# Patient Record
Sex: Male | Born: 1960 | Race: White | Hispanic: No | Marital: Single | Smoking: Current every day smoker
Health system: Southern US, Community
[De-identification: ages and names within clinical notes are randomized; demographics above are authoritative.]

## PROBLEM LIST (undated history)

## (undated) DIAGNOSIS — F17210 Nicotine dependence, cigarettes, uncomplicated: Secondary | ICD-10-CM

## (undated) DIAGNOSIS — I10 Essential (primary) hypertension: Secondary | ICD-10-CM

## (undated) DIAGNOSIS — N186 End stage renal disease: Secondary | ICD-10-CM

## (undated) DIAGNOSIS — B182 Chronic viral hepatitis C: Secondary | ICD-10-CM

## (undated) DIAGNOSIS — E782 Mixed hyperlipidemia: Secondary | ICD-10-CM

---

## 2019-06-02 DIAGNOSIS — F17200 Nicotine dependence, unspecified, uncomplicated: Secondary | ICD-10-CM | POA: Insufficient documentation

## 2019-06-02 DIAGNOSIS — N179 Acute kidney failure, unspecified: Secondary | ICD-10-CM | POA: Insufficient documentation

## 2019-08-10 DIAGNOSIS — N179 Acute kidney failure, unspecified: Secondary | ICD-10-CM | POA: Insufficient documentation

## 2019-08-21 DIAGNOSIS — J156 Pneumonia due to other aerobic Gram-negative bacteria: Secondary | ICD-10-CM | POA: Insufficient documentation

## 2019-08-21 DIAGNOSIS — K8020 Calculus of gallbladder without cholecystitis without obstruction: Secondary | ICD-10-CM | POA: Insufficient documentation

## 2019-08-21 DIAGNOSIS — K760 Fatty (change of) liver, not elsewhere classified: Secondary | ICD-10-CM | POA: Insufficient documentation

## 2019-08-21 DIAGNOSIS — Z992 Dependence on renal dialysis: Secondary | ICD-10-CM | POA: Insufficient documentation

## 2019-08-21 DIAGNOSIS — E43 Unspecified severe protein-calorie malnutrition: Secondary | ICD-10-CM | POA: Insufficient documentation

## 2019-08-21 DIAGNOSIS — M51369 Other intervertebral disc degeneration, lumbar region without mention of lumbar back pain or lower extremity pain: Secondary | ICD-10-CM | POA: Insufficient documentation

## 2019-08-21 DIAGNOSIS — Z967 Presence of other bone and tendon implants: Secondary | ICD-10-CM | POA: Insufficient documentation

## 2019-08-21 DIAGNOSIS — B182 Chronic viral hepatitis C: Secondary | ICD-10-CM | POA: Insufficient documentation

## 2019-08-21 DIAGNOSIS — M5136 Other intervertebral disc degeneration, lumbar region: Secondary | ICD-10-CM | POA: Insufficient documentation

## 2019-09-08 ENCOUNTER — Emergency Department (HOSPITAL_COMMUNITY): Payer: Medicaid Other

## 2019-09-08 ENCOUNTER — Encounter (HOSPITAL_COMMUNITY): Payer: Self-pay | Admitting: Internal Medicine

## 2019-09-08 ENCOUNTER — Inpatient Hospital Stay (HOSPITAL_COMMUNITY)
Admission: EM | Admit: 2019-09-08 | Discharge: 2019-09-17 | DRG: 981 | Disposition: A | Discharge status: Home / Self Care | Payer: Medicaid Other | Attending: Family Medicine | Admitting: Family Medicine

## 2019-09-08 DIAGNOSIS — I160 Hypertensive urgency: Secondary | ICD-10-CM | POA: Diagnosis present

## 2019-09-08 DIAGNOSIS — Z79899 Other long term (current) drug therapy: Secondary | ICD-10-CM

## 2019-09-08 DIAGNOSIS — Z7952 Long term (current) use of systemic steroids: Secondary | ICD-10-CM

## 2019-09-08 DIAGNOSIS — G9341 Metabolic encephalopathy: Secondary | ICD-10-CM | POA: Diagnosis not present

## 2019-09-08 DIAGNOSIS — J449 Chronic obstructive pulmonary disease, unspecified: Secondary | ICD-10-CM | POA: Diagnosis not present

## 2019-09-08 DIAGNOSIS — E782 Mixed hyperlipidemia: Secondary | ICD-10-CM | POA: Diagnosis present

## 2019-09-08 DIAGNOSIS — R0602 Shortness of breath: Secondary | ICD-10-CM

## 2019-09-08 DIAGNOSIS — E8770 Fluid overload, unspecified: Secondary | ICD-10-CM | POA: Diagnosis present

## 2019-09-08 DIAGNOSIS — I10 Essential (primary) hypertension: Secondary | ICD-10-CM | POA: Insufficient documentation

## 2019-09-08 DIAGNOSIS — E875 Hyperkalemia: Secondary | ICD-10-CM | POA: Diagnosis present

## 2019-09-08 DIAGNOSIS — J9601 Acute respiratory failure with hypoxia: Secondary | ICD-10-CM | POA: Diagnosis not present

## 2019-09-08 DIAGNOSIS — F419 Anxiety disorder, unspecified: Secondary | ICD-10-CM | POA: Diagnosis present

## 2019-09-08 DIAGNOSIS — G40909 Epilepsy, unspecified, not intractable, without status epilepticus: Secondary | ICD-10-CM | POA: Diagnosis present

## 2019-09-08 DIAGNOSIS — R569 Unspecified convulsions: Secondary | ICD-10-CM

## 2019-09-08 DIAGNOSIS — Z880 Allergy status to penicillin: Secondary | ICD-10-CM

## 2019-09-08 DIAGNOSIS — Z20822 Contact with and (suspected) exposure to covid-19: Secondary | ICD-10-CM | POA: Diagnosis present

## 2019-09-08 DIAGNOSIS — D631 Anemia in chronic kidney disease: Secondary | ICD-10-CM | POA: Diagnosis present

## 2019-09-08 DIAGNOSIS — N186 End stage renal disease: Secondary | ICD-10-CM | POA: Diagnosis present

## 2019-09-08 DIAGNOSIS — Z9119 Patient's noncompliance with other medical treatment and regimen: Secondary | ICD-10-CM

## 2019-09-08 DIAGNOSIS — E038 Other specified hypothyroidism: Secondary | ICD-10-CM | POA: Diagnosis present

## 2019-09-08 DIAGNOSIS — B182 Chronic viral hepatitis C: Secondary | ICD-10-CM | POA: Diagnosis present

## 2019-09-08 DIAGNOSIS — Z992 Dependence on renal dialysis: Secondary | ICD-10-CM

## 2019-09-08 DIAGNOSIS — N2581 Secondary hyperparathyroidism of renal origin: Secondary | ICD-10-CM | POA: Diagnosis present

## 2019-09-08 DIAGNOSIS — I959 Hypotension, unspecified: Secondary | ICD-10-CM | POA: Diagnosis present

## 2019-09-08 DIAGNOSIS — F17210 Nicotine dependence, cigarettes, uncomplicated: Secondary | ICD-10-CM | POA: Diagnosis present

## 2019-09-08 DIAGNOSIS — R278 Other lack of coordination: Secondary | ICD-10-CM | POA: Diagnosis present

## 2019-09-08 DIAGNOSIS — G92 Toxic encephalopathy: Principal | ICD-10-CM | POA: Diagnosis present

## 2019-09-08 DIAGNOSIS — Z7951 Long term (current) use of inhaled steroids: Secondary | ICD-10-CM

## 2019-09-08 DIAGNOSIS — N250 Renal osteodystrophy: Secondary | ICD-10-CM | POA: Diagnosis present

## 2019-09-08 DIAGNOSIS — R946 Abnormal results of thyroid function studies: Secondary | ICD-10-CM | POA: Diagnosis present

## 2019-09-08 DIAGNOSIS — Z79891 Long term (current) use of opiate analgesic: Secondary | ICD-10-CM

## 2019-09-08 DIAGNOSIS — I120 Hypertensive chronic kidney disease with stage 5 chronic kidney disease or end stage renal disease: Secondary | ICD-10-CM | POA: Diagnosis present

## 2019-09-08 HISTORY — DX: Mixed hyperlipidemia: E78.2

## 2019-09-08 HISTORY — DX: Epilepsy, unspecified, not intractable, without status epilepticus: G40.909

## 2019-09-08 HISTORY — DX: Chronic obstructive pulmonary disease, unspecified: J44.9

## 2019-09-08 HISTORY — DX: End stage renal disease: N18.6

## 2019-09-08 HISTORY — DX: Essential (primary) hypertension: I10

## 2019-09-08 HISTORY — DX: Nicotine dependence, cigarettes, uncomplicated: F17.210

## 2019-09-08 HISTORY — DX: Chronic viral hepatitis C: B18.2

## 2019-09-08 LAB — I-STAT CHEM 8, ED
BUN: 77 mg/dL — ABNORMAL HIGH (ref 8–23)
Calcium, Ion: 1.04 mmol/L — ABNORMAL LOW (ref 1.15–1.40)
Chloride: 104 mmol/L (ref 98–111)
Creatinine, Ser: 13.5 mg/dL — ABNORMAL HIGH (ref 0.61–1.24)
Glucose, Bld: 89 mg/dL (ref 70–99)
HCT: 33 % — ABNORMAL LOW (ref 39.0–52.0)
Hemoglobin: 11.2 g/dL — ABNORMAL LOW (ref 13.0–17.0)
Potassium: 7.7 mmol/L (ref 3.5–5.1)
Sodium: 135 mmol/L (ref 135–145)
TCO2: 23 mmol/L (ref 22–32)

## 2019-09-08 LAB — AMMONIA: Ammonia: 36 umol/L — ABNORMAL HIGH (ref 9–35)

## 2019-09-08 LAB — COMPREHENSIVE METABOLIC PANEL
ALT: 12 U/L (ref 0–44)
AST: 15 U/L (ref 15–41)
Albumin: 2.6 g/dL — ABNORMAL LOW (ref 3.5–5.0)
Alkaline Phosphatase: 100 U/L (ref 38–126)
Anion gap: 20 — ABNORMAL HIGH (ref 5–15)
BUN: 84 mg/dL — ABNORMAL HIGH (ref 8–23)
CO2: 18 mmol/L — ABNORMAL LOW (ref 22–32)
Calcium: 9.1 mg/dL (ref 8.9–10.3)
Chloride: 99 mmol/L (ref 98–111)
Creatinine, Ser: 12.25 mg/dL — ABNORMAL HIGH (ref 0.61–1.24)
GFR calc Af Amer: 5 mL/min — ABNORMAL LOW (ref >60)
GFR calc non Af Amer: 4 mL/min — ABNORMAL LOW (ref >60)
Glucose, Bld: 81 mg/dL (ref 70–99)
Potassium: >7.5 mmol/L (ref 3.5–5.1)
Sodium: 137 mmol/L (ref 135–145)
Total Bilirubin: 0.7 mg/dL (ref 0.3–1.2)
Total Protein: 7.3 g/dL (ref 6.5–8.1)

## 2019-09-08 LAB — CBC WITH DIFFERENTIAL/PLATELET
Abs Immature Granulocytes: 0.04 10*3/uL (ref 0.00–0.07)
Basophils Absolute: 0.1 10*3/uL (ref 0.0–0.1)
Basophils Relative: 1 %
Eosinophils Absolute: 0.3 10*3/uL (ref 0.0–0.5)
Eosinophils Relative: 3 %
HCT: 35.3 % — ABNORMAL LOW (ref 39.0–52.0)
Hemoglobin: 10.7 g/dL — ABNORMAL LOW (ref 13.0–17.0)
Immature Granulocytes: 0 %
Lymphocytes Relative: 13 %
Lymphs Abs: 1.3 10*3/uL (ref 0.7–4.0)
MCH: 28.2 pg (ref 26.0–34.0)
MCHC: 30.3 g/dL (ref 30.0–36.0)
MCV: 92.9 fL (ref 80.0–100.0)
Monocytes Absolute: 0.6 10*3/uL (ref 0.1–1.0)
Monocytes Relative: 6 %
Neutro Abs: 7.5 10*3/uL (ref 1.7–7.7)
Neutrophils Relative %: 77 %
Platelets: 277 10*3/uL (ref 150–400)
RBC: 3.8 MIL/uL — ABNORMAL LOW (ref 4.22–5.81)
RDW: 18.2 % — ABNORMAL HIGH (ref 11.5–15.5)
WBC: 9.8 10*3/uL (ref 4.0–10.5)
nRBC: 0 % (ref 0.0–0.2)

## 2019-09-08 LAB — CBG MONITORING, ED: Glucose-Capillary: 86 mg/dL (ref 70–99)

## 2019-09-08 LAB — RAPID URINE DRUG SCREEN, HOSP PERFORMED
Amphetamines: NOT DETECTED
Barbiturates: NOT DETECTED
Benzodiazepines: POSITIVE — AB
Cocaine: NOT DETECTED
Opiates: NOT DETECTED
Tetrahydrocannabinol: POSITIVE — AB

## 2019-09-08 LAB — URINALYSIS, ROUTINE W REFLEX MICROSCOPIC
Bilirubin Urine: NEGATIVE
Glucose, UA: >=500 mg/dL — AB
Hgb urine dipstick: NEGATIVE
Ketones, ur: NEGATIVE mg/dL
Leukocytes,Ua: NEGATIVE
Nitrite: NEGATIVE
Protein, ur: >=300 mg/dL — AB
Specific Gravity, Urine: 1.012 (ref 1.005–1.030)
pH: 8 (ref 5.0–8.0)

## 2019-09-08 LAB — LACTIC ACID, PLASMA: Lactic Acid, Venous: 1.1 mmol/L (ref 0.5–1.9)

## 2019-09-08 LAB — ETHANOL: Alcohol, Ethyl (B): <10 mg/dL (ref <10)

## 2019-09-08 LAB — SARS CORONAVIRUS 2 BY RT PCR (HOSPITAL ORDER, PERFORMED IN ~~LOC~~ HOSPITAL LAB): SARS Coronavirus 2: NEGATIVE

## 2019-09-08 MED ORDER — INSULIN ASPART 100 UNIT/ML IV SOLN
5.0000 [IU] | Freq: Once | INTRAVENOUS | Status: AC
  Administered 2019-09-08: 5 [IU] via INTRAVENOUS

## 2019-09-08 MED ORDER — METOPROLOL TARTRATE 25 MG PO TABS
25.0000 mg | ORAL_TABLET | Freq: Two times a day (BID) | ORAL | Status: DC
  Administered 2019-09-08 – 2019-09-09 (×2): 25 mg via ORAL
  Filled 2019-09-08 (×2): qty 1

## 2019-09-08 MED ORDER — ONDANSETRON HCL 4 MG PO TABS: 4.0000 mg | ORAL_TABLET | Freq: Four times a day (QID) | ORAL | Status: DC | PRN

## 2019-09-08 MED ORDER — ONDANSETRON HCL 4 MG/2ML IJ SOLN: 4.0000 mg | Freq: Four times a day (QID) | INTRAMUSCULAR | Status: DC | PRN

## 2019-09-08 MED ORDER — HEPARIN SODIUM (PORCINE) 5000 UNIT/ML IJ SOLN
5000.0000 [IU] | Freq: Three times a day (TID) | INTRAMUSCULAR | Status: DC
  Administered 2019-09-08 – 2019-09-16 (×24): 5000 [IU] via SUBCUTANEOUS
  Filled 2019-09-08 (×27): qty 1

## 2019-09-08 MED ORDER — LOSARTAN POTASSIUM 50 MG PO TABS
50.0000 mg | ORAL_TABLET | Freq: Every day | ORAL | Status: DC
  Administered 2019-09-09: 50 mg via ORAL
  Filled 2019-09-08: qty 1

## 2019-09-08 MED ORDER — IPRATROPIUM-ALBUTEROL 0.5-2.5 (3) MG/3ML IN SOLN: 3.0000 mL | RESPIRATORY_TRACT | Status: DC | PRN

## 2019-09-08 MED ORDER — ACETAMINOPHEN 650 MG RE SUPP: 650.0000 mg | Freq: Four times a day (QID) | RECTAL | Status: DC | PRN

## 2019-09-08 MED ORDER — CALCIUM GLUCONATE-NACL 1-0.675 GM/50ML-% IV SOLN
1.0000 g | Freq: Once | INTRAVENOUS | Status: AC
  Administered 2019-09-08: 1000 mg via INTRAVENOUS
  Filled 2019-09-08: qty 50

## 2019-09-08 MED ORDER — TAMSULOSIN HCL 0.4 MG PO CAPS
0.4000 mg | ORAL_CAPSULE | Freq: Every day | ORAL | Status: DC
  Administered 2019-09-08: 0.4 mg via ORAL
  Filled 2019-09-08: qty 1

## 2019-09-08 MED ORDER — CHLORHEXIDINE GLUCONATE CLOTH 2 % EX PADS
6.0000 | MEDICATED_PAD | Freq: Every day | CUTANEOUS | Status: DC
  Administered 2019-09-09 – 2019-09-16 (×6): 6 via TOPICAL

## 2019-09-08 MED ORDER — LORAZEPAM 2 MG/ML IJ SOLN
1.0000 mg | Freq: Once | INTRAMUSCULAR | Status: AC
  Administered 2019-09-08: 1 mg via INTRAVENOUS
  Filled 2019-09-08: qty 1

## 2019-09-08 MED ORDER — GABAPENTIN 300 MG PO CAPS
300.0000 mg | ORAL_CAPSULE | Freq: Every day | ORAL | Status: DC
  Administered 2019-09-08 – 2019-09-17 (×10): 300 mg via ORAL
  Filled 2019-09-08 (×10): qty 1

## 2019-09-08 MED ORDER — ACETAMINOPHEN 325 MG PO TABS
650.0000 mg | ORAL_TABLET | Freq: Four times a day (QID) | ORAL | Status: DC | PRN
  Administered 2019-09-10 – 2019-09-16 (×5): 650 mg via ORAL
  Filled 2019-09-08 (×5): qty 2

## 2019-09-08 MED ORDER — SODIUM ZIRCONIUM CYCLOSILICATE 10 G PO PACK
10.0000 g | PACK | Freq: Once | ORAL | Status: AC
  Administered 2019-09-08: 10 g via ORAL
  Filled 2019-09-08: qty 1

## 2019-09-08 MED ORDER — HYDRALAZINE HCL 50 MG PO TABS
50.0000 mg | ORAL_TABLET | Freq: Two times a day (BID) | ORAL | Status: DC
  Administered 2019-09-08 – 2019-09-09 (×2): 50 mg via ORAL
  Filled 2019-09-08: qty 1
  Filled 2019-09-08: qty 2

## 2019-09-08 MED ORDER — DEXTROSE 50 % IV SOLN
1.0000 | Freq: Once | INTRAVENOUS | Status: AC
  Administered 2019-09-08: 50 mL via INTRAVENOUS
  Filled 2019-09-08: qty 50

## 2019-09-08 MED ORDER — POLYETHYLENE GLYCOL 3350 17 G PO PACK
17.0000 g | PACK | Freq: Every day | ORAL | Status: DC | PRN
  Filled 2019-09-08: qty 1

## 2019-09-08 MED ORDER — HYDRALAZINE HCL 20 MG/ML IJ SOLN: 10.0000 mg | Freq: Four times a day (QID) | INTRAMUSCULAR | Status: DC | PRN

## 2019-09-08 MED ORDER — BUDESONIDE 0.5 MG/2ML IN SUSP
0.5000 mg | Freq: Two times a day (BID) | RESPIRATORY_TRACT | Status: DC
  Administered 2019-09-09 – 2019-09-17 (×16): 0.5 mg via RESPIRATORY_TRACT
  Filled 2019-09-08 (×18): qty 2

## 2019-09-08 MED ORDER — SODIUM BICARBONATE 650 MG PO TABS
650.0000 mg | ORAL_TABLET | Freq: Two times a day (BID) | ORAL | Status: DC
  Administered 2019-09-08 – 2019-09-09 (×2): 650 mg via ORAL
  Filled 2019-09-08 (×3): qty 1

## 2019-09-08 MED ORDER — AMLODIPINE BESYLATE 10 MG PO TABS
10.0000 mg | ORAL_TABLET | Freq: Every day | ORAL | Status: DC
  Administered 2019-09-09: 10 mg via ORAL
  Filled 2019-09-08: qty 1

## 2019-09-08 MED ORDER — ATORVASTATIN CALCIUM 40 MG PO TABS
40.0000 mg | ORAL_TABLET | Freq: Every day | ORAL | Status: DC
  Administered 2019-09-09 – 2019-09-17 (×9): 40 mg via ORAL
  Filled 2019-09-08 (×9): qty 1

## 2019-09-08 NOTE — ED Notes (Signed)
Redrawn I-Stat and notified MD Tomi Bamberger and RN Anderson Malta of K 7.7.

## 2019-09-08 NOTE — H&P (Addendum)
History and Physical    Ralph Becker KGU:[id number] DOB: 1960-08-14 DOA: 09/08/2019  PCP: Patient, No Pcp Per  Patient coming from: Home   Chief Complaint:  Chief Complaint  Patient presents with  . Seizures  . Altered Mental Status     HPI:    59 year old male with past medical history of COPD, end-stage renal disease (Right IJ cath, getting HD tues, thurs, sat), hypertension, hyperlipidemia, nicotine dependence, hepatitis C and seizure disorder (not on antiepileptics ), chronic low back pain, who presents to [facility] emergency department via EMS status post episode of seizure.  Patient is an extremely poor historian due to confusion.  Patient explains that earlier today he was sitting in his wheelchair when experienced a seizure and states that he "was feeling shaky" prior to the onset of the seizure.  Patient is unable to attest to any tongue biting, self urination or self defecation.  According to emergency department notes seizure lasted approximately 1 minute.  Upon further questioning, patient does admit to missing several sessions of dialysis however exactly how many is unclear.  At 1 point he said he missed a single session, then he denied missing any sessions at all, then he admitted to missing 2 sessions in the past week.  Patient denies any recent history of shortness of breath, cough, diarrhea, abdominal pain, nausea, vomiting, sick contacts.  Patient is unable to provide further detail due to being a poor historian and confusion.  Upon evaluation in the emergency department, patient was found to have a potassium of 7.7.  Patient was administered calcium gluconate, insulin, dextrose and Lokelma.  Dr. Augustin Coupe with nephrology was contacted who stated that they will proceed with emergent dialysis this evening.  Considering the patient's seizure activity, case was discussed with Dr. Lorraine Lax who stated that considering patient's presentation with uremia not to load patient with  antiepileptics at this time.  The hospitalist group was then called to assess the patient for admission the hospital.   Review of Systems: Unable to obtain due to patient being a poor historian.   Past Medical History:  Diagnosis Date  . Chronic hepatitis C ([place])   . COPD (chronic obstructive pulmonary disease) ([place]) 09/08/2019  . ESRD (end stage renal disease) ([place])    HD Tues, Thurs, Sat  . Essential hypertension   . Mixed hyperlipidemia   . Nicotine dependence, cigarettes, uncomplicated   . Seizure disorder ([place]) 09/08/2019    History reviewed. No pertinent surgical history.   reports that he has been smoking. He has never used smokeless tobacco. He reports that he does not drink alcohol and does not use drugs.  Allergies  Allergen Reactions  . Penicillins     Unknown. Can't remember.     Family History  Family history unknown: Yes     Prior to Admission medications   Medication Sig Start Date End Date Taking? Authorizing Provider  clonazePAM (KLONOPIN) 1 MG tablet Take 1 mg by mouth at bedtime. 07/11/19  Yes [provider]  FLOVENT HFA 220 MCG/ACT inhaler Inhale 2 puffs into the lungs every 12 (twelve) hours. 07/11/19  Yes [provider]  gabapentin (NEURONTIN) 300 MG capsule Take 300 mg by mouth daily. 06/06/19  Yes [provider]  hydrALAZINE (APRESOLINE) 50 MG tablet Take 50 mg by mouth in the morning and at bedtime.  08/23/19  Yes [provider]  labetalol (NORMODYNE) 200 MG tablet Take 200 mg by mouth every 12 (twelve) hours. 08/23/19  Yes [provider]  losartan (COZAAR) 50 MG tablet Take 50 mg by mouth daily. 06/06/19  Yes [provider]  metoprolol tartrate (LOPRESSOR) 25 MG tablet Take 25 mg by mouth 2 (two) times daily. 08/08/19  Yes [provider]  sodium bicarbonate 650 MG tablet Take 650 mg by mouth 2 (two) times daily.  06/06/19  Yes [provider]  SUBOXONE 8-2 MG FILM Place 1 Film under the  tongue daily. 08/23/19  Yes [provider]  tamsulosin (FLOMAX) 0.4 MG CAPS capsule Take 0.4 mg by mouth at bedtime. 06/26/19  Yes [provider]  amLODipine (NORVASC) 10 MG tablet Take 10 mg by mouth daily. 08/23/19   [provider]  atorvastatin (LIPITOR) 40 MG tablet Take 40 mg by mouth daily. 08/08/19   [provider]  clotrimazole (LOTRIMIN) 1 % cream Apply 1 application topically 3 (three) times daily. 08/22/19   [provider]    Physical Exam: Vitals:   09/08/19 2151 09/08/19 2200 09/08/19 2208 09/08/19 2215  BP:  (!) 169/100  (!) 188/124  Pulse: 83 84 83 89  Resp: (!) 21  (!) 23 (!) 26  SpO2: 96% 92% 97% 98%  Weight:      Height:        Constitutional: Lethargic but arousable and oriented x 1, patient is not in any acute distress. Skin: no rashes, no lesions, good skin turgor noted. Eyes: Pupils are equally reactive to light.  No evidence of scleral icterus or conjunctival pallor.  ENMT: Moist mucous membranes noted.  Posterior pharynx clear of any exudate or lesions.   Neck: normal, supple, no masses, no thyromegaly.  No evidence of jugular venous distension.   Respiratory: Expiratory wheezing noted, mostly in the left lung fields.  Faint bibasilar rales, Normal respiratory effort. No accessory muscle use.  Cardiovascular: Regular rate and rhythm, no murmurs / rubs / gallops. No extremity edema. 2+ pedal pulses. No carotid bruits.  Chest:   Right IJ catheter noted to be in place.  Nontender without crepitus or deformity.   Back:   Nontender without crepitus or deformity. Abdomen: Abdomen is soft and nontender.  No evidence of intra-abdominal masses.  Positive bowel sounds noted in all quadrants.   Musculoskeletal: No joint deformity upper and lower extremities. Good ROM, no contractures. Normal muscle tone.  Neurologic: Patient is lethargic but arousable and only oriented x1.  Patient is somewhat tremulous at rest.  CN 2-12 grossly  intact. Sensation intact, strength noted to be 5 out of 5 in all 4 extremities.  Patient is following all commands.  Patient is responsive to verbal stimuli.   Psychiatric: Patient presents as a depressed mood with flat affect.  Assessment obscured by confusion.  Patient does not seem to possess insight as to his current situation.   Labs on Admission: I have personally reviewed following labs and imaging studies -   CBC: Recent Labs  Lab 09/08/19 1904 09/08/19 1951  WBC 9.8  --   NEUTROABS 7.5  --   HGB 10.7* 11.2*  HCT 35.3* 33.0*  MCV 92.9  --   PLT 277  --    Basic Metabolic Panel: Recent Labs  Lab 09/08/19 1951 09/08/19 2013  NA 135 137  K 7.7* >7.5*  CL 104 99  CO2  --  18*  GLUCOSE 89 81  BUN 77* 84*  CREATININE 13.50* 12.25*  CALCIUM  --  9.1   GFR: Estimated Creatinine Clearance: 6.7 mL/min (A) (by C-G formula based  on SCr of 12.25 mg/dL (H)). Liver Function Tests: Recent Labs  Lab 09/08/19 2013  AST 15  ALT 12  ALKPHOS 100  BILITOT 0.7  PROT 7.3  ALBUMIN 2.6*   No results for input(s): LIPASE, AMYLASE in the last 168 hours. Recent Labs  Lab 09/08/19 0704  AMMONIA 36*   Coagulation Profile: No results for input(s): INR, PROTIME in the last 168 hours. Cardiac Enzymes: No results for input(s): CKTOTAL, CKMB, CKMBINDEX, TROPONINI in the last 168 hours. BNP (last 3 results) No results for input(s): PROBNP in the last 8760 hours. HbA1C: No results for input(s): HGBA1C in the last 72 hours. CBG: Recent Labs  Lab 09/08/19 1947  GLUCAP 86   Lipid Profile: No results for input(s): CHOL, HDL, LDLCALC, TRIG, CHOLHDL, LDLDIRECT in the last 72 hours. Thyroid Function Tests: No results for input(s): TSH, T4TOTAL, FREET4, T3FREE, THYROIDAB in the last 72 hours. Anemia Panel: No results for input(s): VITAMINB12, FOLATE, FERRITIN, TIBC, IRON, RETICCTPCT in the last 72 hours. Urine analysis: No results found for: COLORURINE, APPEARANCEUR, LABSPEC,  Olean, GLUCOSEU, HGBUR, BILIRUBINUR, KETONESUR, PROTEINUR, UROBILINOGEN, NITRITE, LEUKOCYTESUR  Radiological Exams on Admission - Personally Reviewed: CT HEAD WO CONTRAST  Result Date: 09/08/2019 CLINICAL DATA:  Recent seizure activity EXAM: CT HEAD WITHOUT CONTRAST TECHNIQUE: Contiguous axial images were obtained from the base of the skull through the vertex without intravenous contrast. COMPARISON:  08/09/2019 FINDINGS: Brain: No evidence of acute infarction, hemorrhage, hydrocephalus, extra-axial collection or mass lesion/mass effect. Mild chronic white matter ischemic changes are noted. Vascular: No hyperdense vessel or unexpected calcification. Skull: Normal. Negative for fracture or focal lesion. Sinuses/Orbits: No acute finding. Other: None. IMPRESSION: No acute intracranial abnormality noted. Electronically Signed   By: Inez Catalina M.D.   On: 09/08/2019 19:34   DG Chest Port 1 View  Result Date: 09/08/2019 CLINICAL DATA:  Seizure. EXAM: PORTABLE CHEST 1 VIEW COMPARISON:  August 09, 2019 (7:31 a.m.) FINDINGS: The endotracheal tube and nasogastric tube seen on the prior study have been removed. A right internal jugular venous catheter is noted with its distal tip at the junction of the superior vena cava and right atrium. This represents a new finding. Mild, diffuse chronic appearing increased lung markings are seen. There is no evidence of acute infiltrate, pleural effusion or pneumothorax. The heart size and mediastinal contours are within normal limits. Multiple chronic right-sided rib fractures are seen. IMPRESSION: Interval right internal jugular venous catheter placement positioning, as described above, without evidence of acute or active cardiopulmonary disease. Electronically Signed   By: Virgina Norfolk M.D.   On: 09/08/2019 18:31    EKG: Personally reviewed.  Rhythm is normal sinus rhythm with heart rate of 81 bpm.  Notable peak T waves noted.  No dynamic ST segment changes  appreciated.  Assessment/Plan Principal Problem:   Hyperkalemia   Patient presenting with severe hyperkalemia with peaked T waves in the setting of multiple missed sessions of dialysis.  Calcium gluconate, insulin, dextrose, Lokelma all administered by emergency department staff  Admitting patient to stepdown unit and monitoring on telemetry  Case is already been discussed with Dr. Augustin Coupe with nephrology by the emergency department staff who will proceed with emergent dialysis this evening.  Obtaining serial chemistries to monitor downtrending potassium levels  Active Problems:   Acute metabolic encephalopathy   Likely secondary to uremic encephalopathy  Obtaining TSH, folate and vitamin B12  Will monitor for symptomatic improvement after dialysis  CT head unremarkable    Hypertensive urgency  Patient presenting with substantial hypertension, suspected medication noncompliance  Likely exacerbated by volume overload  We will restart home regimen of antihypertensives  We will provide patient with as needed antihypertensives for excessively elevated blood pressures  Blood pressures should improve status post dialysis    Seizure [facility])   Patient exhibited a 1 minute episode of seizure activity prior to being brought to [facility] for evaluation  Of note, patient was hospitalized at [facility] in March for seizure and was not sent home on any medications.  Patient again presented to [facility] in early June with reported seizures.  EEG unremarkable.  Again was not discharged on any antiepileptics.  Case discussed with Dr. Lorraine Lax with allergy who recommended not loading the patient with any medications as patient's uremia is likely contributing to this episode of seizure.  He recommends consulting neurology and initiating intravenous Keppra load if patient seizes again.  Monitoring for seizure activity, seizure precautions.  Will order EEG for the morning.  Of  note, EEG performed at [facility] in early June was unremarkable.    Mixed hyperlipidemia   Continue home regimen of statin    COPD (chronic obstructive pulmonary disease) (HCC)  Mild wheezing on examination however I do not believe patient is suffering from COPD exacerbation  As needed bronchodilator therapy for shortness of breath and wheezing    Code Status:  Full code Family Communication: Patient's home phone number does not work.  There was a number listed for his primary point of contact however this number was a wrong number.   Status is: Observation  The patient remains OBS appropriate and will d/c before 2 midnights.  Dispo: The patient is from: Home              Anticipated d/c is to: Home              Anticipated d/c date is: 2 days              Patient currently is not medically stable to d/c.        Vernelle Emerald MD [facility] Pager [phone]  If 7PM-7AM, please contact night-coverage [email or web address] Use universal [place] password for that web site. If you do not have the password, please call the hospital operator.  09/08/2019, 10:33 PM

## 2019-09-08 NOTE — Consult Note (Signed)
Reason for Consult: ESRD Referring Physician:  Dr. Tomi Bamberger  Chief Complaint: Seizure  HD orders: TTS at [facility] 4 hrs 0 min, 180NRe Optiflux EDW 60 (kg), Dialysate 2.0 K, 2.25 Ca UFR Profile: None, Sodium Model: None, Access: TC Calcitriol 0.25 PO TIW Heparin 4000 bolus S/p Venofer 100mg  x10  Assessment/Plan: 1. ESRD - TTS @ [facility] with Dr. Johnney Ou and has been reaching EDW. [place] with last 2 treatments on 6/26 and 7/1 missing a treatment at least once a week past 2 weeks. Will plan on dialyzing emergently tonight.  2. Hyperkalemic - see above 3. Renal osteodystrophy - will check a phos for binder management. 4. Anemia - @ goal; s/p Venofer 100mg  x10 5. Seizure - uremic? Vs pt's baseline sz disorder 6. HTN - resume anti-hypertensive regimen + UF 7. H/o opioid use - now on suboxone 8/2 1 film per day   HPI: Ralph Becker is an 59 y.o. male  Presenting with a witnessed seizures by the family lasting a minute. There are no reported falls or head trauma as a result of the seizure but the pt was altered post seizure. He did miss dialysis on Saturday and the pt was noted to be disoriented and hypertensive in the ED. He has missed a treatment each week for the past 2 weeks but has reached his EDW when he does go for a treatment. He states that the missed treatments are a result of transportation.  By review of notes he lives with a friend Ralph Becker. He denies f/c/n/v/ cough/ anorexia/ dizziness/ CP.  ROS Pertinent items are noted in HPI.  Chemistry and CBC: Creatinine, Ser  Date/Time Value Ref Range Status  09/08/2019 08:13 PM 12.25 (H) 0.61 - 1.24 mg/dL Final  09/08/2019 07:51 PM 13.50 (H) 0.61 - 1.24 mg/dL Final   Recent Labs  Lab 09/08/19 1951 09/08/19 2013  NA 135 137  K 7.7* >7.5*  CL 104 99  CO2  --  18*  GLUCOSE 89 81  BUN 77* 84*  CREATININE 13.50* 12.25*  CALCIUM  --  9.1   Recent Labs  Lab 09/08/19 1904 09/08/19 1951  WBC 9.8  --   NEUTROABS 7.5  --   HGB 10.7* 11.2*   HCT 35.3* 33.0*  MCV 92.9  --   PLT 277  --    Liver Function Tests: Recent Labs  Lab 09/08/19 2013  AST 15  ALT 12  ALKPHOS 100  BILITOT 0.7  PROT 7.3  ALBUMIN 2.6*   No results for input(s): LIPASE, AMYLASE in the last 168 hours. Recent Labs  Lab 09/08/19 0704  AMMONIA 36*   Cardiac Enzymes: No results for input(s): CKTOTAL, CKMB, CKMBINDEX, TROPONINI in the last 168 hours. Iron Studies: No results for input(s): IRON, TIBC, TRANSFERRIN, FERRITIN in the last 72 hours. PT/INR: @LABRCNTIP (inr:5)  Xrays/Other Studies: ) Results for orders placed or performed during the hospital encounter of 09/08/19 (from the past 48 hour(s))  Ammonia     Status: Abnormal   Collection Time: 09/08/19  7:04 AM  Result Value Ref Range   Ammonia 36 (H) 9 - 35 umol/L    Comment: Performed at [facility], [address], [place], [place] [address]  CBC WITH DIFFERENTIAL     Status: Abnormal   Collection Time: 09/08/19  7:04 PM  Result Value Ref Range   WBC 9.8 4.0 - 10.5 K/uL   RBC 3.80 (L) 4.22 - 5.81 MIL/uL   Hemoglobin 10.7 (L) 13.0 - 17.0 g/dL  HCT 35.3 (L) 39 - 52 %   MCV 92.9 80.0 - 100.0 fL   MCH 28.2 26.0 - 34.0 pg   MCHC 30.3 30.0 - 36.0 g/dL   RDW 18.2 (H) 11.5 - 15.5 %   Platelets 277 150 - 400 K/uL   nRBC 0.0 0.0 - 0.2 %   Neutrophils Relative % 77 %   Neutro Abs 7.5 1.7 - 7.7 K/uL   Lymphocytes Relative 13 %   Lymphs Abs 1.3 0.7 - 4.0 K/uL   Monocytes Relative 6 %   Monocytes Absolute 0.6 0 - 1 K/uL   Eosinophils Relative 3 %   Eosinophils Absolute 0.3 0 - 0 K/uL   Basophils Relative 1 %   Basophils Absolute 0.1 0 - 0 K/uL   Immature Granulocytes 0 %   Abs Immature Granulocytes 0.04 0.00 - 0.07 K/uL    Comment: Performed at [facility] [address], [place], [place] [address]  Ethanol     Status: None   Collection Time: 09/08/19  7:04 PM  Result Value Ref Range   Alcohol, Ethyl (B) <10 <10 mg/dL    Comment: (NOTE) Lowest detectable limit for  serum alcohol is 10 mg/dL.  For medical purposes only. Performed at [facility], [place] [address] [place], [place] [address]   Lactic acid, plasma     Status: None   Collection Time: 09/08/19  7:45 PM  Result Value Ref Range   Lactic Acid, Venous 1.1 0.5 - 1.9 mmol/L    Comment: Performed at [facility] [address], [place], [place] [address]  CBG monitoring, ED     Status: None   Collection Time: 09/08/19  7:47 PM  Result Value Ref Range   Glucose-Capillary 86 70 - 99 mg/dL    Comment: Glucose reference range applies only to samples taken after fasting for at least 8 hours.   Comment 1 Document in Chart   I-Stat Chem 8, ED     Status: Abnormal   Collection Time: 09/08/19  7:51 PM  Result Value Ref Range   Sodium 135 135 - 145 mmol/L   Potassium 7.7 (HH) 3.5 - 5.1 mmol/L   Chloride 104 98 - 111 mmol/L   BUN 77 (H) 8 - 23 mg/dL   Creatinine, Ser 13.50 (H) 0.61 - 1.24 mg/dL   Glucose, Bld 89 70 - 99 mg/dL    Comment: Glucose reference range applies only to samples taken after fasting for at least 8 hours.   Calcium, Ion 1.04 (L) 1.15 - 1.40 mmol/L   TCO2 23 22 - 32 mmol/L   Hemoglobin 11.2 (L) 13.0 - 17.0 g/dL   HCT 33.0 (L) 39 - 52 %  Comprehensive metabolic panel     Status: Abnormal   Collection Time: 09/08/19  8:13 PM  Result Value Ref Range   Sodium 137 135 - 145 mmol/L   Potassium >7.5 (HH) 3.5 - 5.1 mmol/L    Comment: NO VISIBLE HEMOLYSIS CRITICAL RESULT CALLED TO, READ BACK BY AND VERIFIED WITH: PHILLIPS T,RN 09/08/19 2108 WAYK    Chloride 99 98 - 111 mmol/L   CO2 18 (L) 22 - 32 mmol/L   Glucose, Bld 81 70 - 99 mg/dL    Comment: Glucose reference range applies only to samples taken after fasting for at least 8 hours.   BUN 84 (H) 8 - 23 mg/dL   Creatinine, Ser 12.25 (H) 0.61 - 1.24 mg/dL   Calcium 9.1 8.9 -  10.3 mg/dL   Total Protein 7.3 6.5 - 8.1 g/dL   Albumin 2.6 (L) 3.5 - 5.0 g/dL   AST 15 15 - 41 U/L   ALT 12 0 - 44 U/L   Alkaline  Phosphatase 100 38 - 126 U/L   Total Bilirubin 0.7 0.3 - 1.2 mg/dL   GFR calc non Af Amer 4 (L) >60 mL/min   GFR calc Af Amer 5 (L) >60 mL/min   Anion gap 20 (H) 5 - 15    Comment: Performed at [facility] [address], [place], [place] [address]   CT HEAD WO CONTRAST  Result Date: 09/08/2019 CLINICAL DATA:  Recent seizure activity EXAM: CT HEAD WITHOUT CONTRAST TECHNIQUE: Contiguous axial images were obtained from the base of the skull through the vertex without intravenous contrast. COMPARISON:  08/09/2019 FINDINGS: Brain: No evidence of acute infarction, hemorrhage, hydrocephalus, extra-axial collection or mass lesion/mass effect. Mild chronic white matter ischemic changes are noted. Vascular: No hyperdense vessel or unexpected calcification. Skull: Normal. Negative for fracture or focal lesion. Sinuses/Orbits: No acute finding. Other: None. IMPRESSION: No acute intracranial abnormality noted. Electronically Signed   By: Inez Catalina M.D.   On: 09/08/2019 19:34   DG Chest Port 1 View  Result Date: 09/08/2019 CLINICAL DATA:  Seizure. EXAM: PORTABLE CHEST 1 VIEW COMPARISON:  August 09, 2019 (7:31 a.m.) FINDINGS: The endotracheal tube and nasogastric tube seen on the prior study have been removed. A right internal jugular venous catheter is noted with its distal tip at the junction of the superior vena cava and right atrium. This represents a new finding. Mild, diffuse chronic appearing increased lung markings are seen. There is no evidence of acute infiltrate, pleural effusion or pneumothorax. The heart size and mediastinal contours are within normal limits. Multiple chronic right-sided rib fractures are seen. IMPRESSION: Interval right internal jugular venous catheter placement positioning, as described above, without evidence of acute or active cardiopulmonary disease. Electronically Signed   By: Virgina Norfolk M.D.   On: 09/08/2019 18:31    PMH:  No past medical history on  file.   Allergies:  Allergies  Allergen Reactions  . Penicillins     Unknown. Can't remember.     Medications:   Prior to Admission medications   Medication Sig Start Date End Date Taking? Authorizing Provider  amLODipine (NORVASC) 10 MG tablet Take 10 mg by mouth daily. 08/23/19  Yes [provider]  atorvastatin (LIPITOR) 40 MG tablet Take 40 mg by mouth daily. 08/08/19   [provider]  clonazePAM (KLONOPIN) 1 MG tablet Take 1 mg by mouth at bedtime. 07/11/19   [provider]  clotrimazole (LOTRIMIN) 1 % cream Apply 1 application topically 3 (three) times daily. 08/22/19   [provider]  FLOVENT HFA 220 MCG/ACT inhaler Inhale 2 puffs into the lungs every 12 (twelve) hours. 07/11/19   [provider]  gabapentin (NEURONTIN) 300 MG capsule Take 300 mg by mouth daily. 06/06/19   [provider]  hydrALAZINE (APRESOLINE) 50 MG tablet Take 50 mg by mouth every 8 (eight) hours. 08/23/19   [provider]  labetalol (NORMODYNE) 200 MG tablet Take 200 mg by mouth every 12 (twelve) hours. 08/23/19   [provider]  losartan (COZAAR) 50 MG tablet Take 50 mg by mouth daily. 06/06/19   [provider]  metoprolol tartrate (LOPRESSOR) 25 MG tablet Take 25 mg by mouth 2 (two) times daily. 08/08/19   [provider]  sodium  bicarbonate 650 MG tablet Take 650 mg by mouth in the morning, at noon, in the evening, and at bedtime. 06/06/19   [provider]  SUBOXONE 8-2 MG FILM Place 1 Film under the tongue daily. 08/23/19   [provider]  tamsulosin (FLOMAX) 0.4 MG CAPS capsule Take 0.4 mg by mouth at bedtime. 06/26/19   [provider]    Discontinued Meds:  There are no discontinued medications.  Social History:  has no history on file for tobacco use, alcohol use, and drug use.  Family History:  No family history on file.  Blood pressure (!) 188/106, pulse (!) 156, resp. rate (!) 26, height  6' (1.829 m), weight 74.8 kg, SpO2 95 %. General appearance: alert, cooperative and appears stated age Head: Normocephalic, without obvious abnormality, atraumatic Eyes: negative Neck: no adenopathy, no carotid bruit, no JVD, supple, symmetrical, trachea midline and thyroid not enlarged, symmetric, no tenderness/mass/nodules Back: symmetric, no curvature. ROM normal. No CVA tenderness. Resp: rhonchi bilaterally Chest wall: no tenderness Cardio: regular rate and rhythm GI: soft, non-tender; bowel sounds normal; no masses,  no organomegaly Extremities: extremities normal, atraumatic, no cyanosis or edema Pulses: 2+ and symmetric Skin: Skin color, texture, turgor normal. No rashes or lesions Lymph nodes: Cervical adenopathy: none Neurologic: Mental status: Slowed but cooperative Access: RIJ TC       Dwana Melena, MD 09/08/2019, 9:33 PM

## 2019-09-08 NOTE — ED Provider Notes (Signed)
[place] EMERGENCY DEPARTMENT Provider Note   CSN: [id number] Arrival date & time: 09/08/19  1734     History Chief Complaint  Patient presents with  . Seizures  . Altered Mental Status    Ralph Becker is a 59 y.o. male.  Patient with history of end-stage renal disease presents from home by [facility].  Though 5 caveat due to altered mental status.  Patient reportedly had a seizure lasted approximately 1 minute.  History from EMS.  Patient is able to tell me his name but not give any details about why he is here.  He is very disoriented.  States that he lives at home with a friend named Ralph Becker.  Patient was hypertensive prior to arrival.  Normal blood sugar.  Reportedly postictal.        Past Medical History:  Diagnosis Date  . Chronic hepatitis C ([place])   . COPD (chronic obstructive pulmonary disease) ([place]) 09/08/2019  . ESRD (end stage renal disease) ([place])    HD Tues, Thurs, Sat  . Essential hypertension   . Mixed hyperlipidemia   . Nicotine dependence, cigarettes, uncomplicated   . Seizure disorder ([place]) 09/08/2019    Patient Active Problem List   Diagnosis Date Noted  . Hyperkalemia 09/08/2019  . Seizure disorder ([place]) 09/08/2019  . Acute metabolic encephalopathy 40/98/1191  . Hypertensive urgency 09/08/2019  . Seizure ([place]) 09/08/2019  . COPD (chronic obstructive pulmonary disease) ([place]) 09/08/2019  . Mixed hyperlipidemia   . Essential hypertension     History reviewed. No pertinent surgical history.     No family history on file.  Social History   Tobacco Use  . Smoking status: Not on file  Substance Use Topics  . Alcohol use: Not on file  . Drug use: Not on file    Home Medications Prior to Admission medications   Not on File    Allergies    Patient has no allergy information on record.  Review of Systems   Review of Systems  Unable to perform ROS: Mental status change    Physical Exam Updated Vital  Signs BP (!) 169/108   Pulse 85   Resp (!) 26   Ht 6' (1.829 m)   Wt 74.8 kg   SpO2 96%   BMI 22.38 kg/m   Physical Exam Vitals and nursing note reviewed.  Constitutional:      Appearance: He is well-developed.  HENT:     Head: Normocephalic and atraumatic.     Right Ear: External ear normal.     Left Ear: External ear normal.     Nose: Nose normal.     Mouth/Throat:     Mouth: Mucous membranes are moist.  Eyes:     General:        Right eye: No discharge.        Left eye: No discharge.     Conjunctiva/sclera: Conjunctivae normal.  Cardiovascular:     Rate and Rhythm: Normal rate and regular rhythm.     Heart sounds: Normal heart sounds.  Pulmonary:     Effort: Pulmonary effort is normal.     Breath sounds: Normal breath sounds.  Abdominal:     Palpations: Abdomen is soft.     Tenderness: There is no abdominal tenderness. There is no guarding or rebound.  Musculoskeletal:        General: No deformity.     Cervical back: Normal range of motion and neck supple.  Skin:  General: Skin is warm and dry.     Findings: No bruising.  Neurological:     Mental Status: He is disoriented.     GCS: GCS eye subscore is 4. GCS verbal subscore is 5. GCS motor subscore is 6.     Cranial Nerves: No facial asymmetry.     Motor: Tremor present. No pronator drift.     Coordination: Finger-Nose-Finger Test abnormal.     Comments: Patient follows basic commands.  He is tremulous.  He has difficulty with finger-to-nose testing.  He can tell me his name.  He cannot tell me his birthday, date or year.  He cannot tell me where he is at.     ED Results / Procedures / Treatments   Labs (all labs ordered are listed, but only abnormal results are displayed) Labs Reviewed  CBC WITH DIFFERENTIAL/PLATELET - Abnormal; Notable for the following components:      Result Value   RBC 3.80 (*)    Hemoglobin 10.7 (*)    HCT 35.3 (*)    RDW 18.2 (*)    All other components within normal limits   AMMONIA - Abnormal; Notable for the following components:   Ammonia 36 (*)    All other components within normal limits  URINALYSIS, ROUTINE W REFLEX MICROSCOPIC - Abnormal; Notable for the following components:   Glucose, UA >=500 (*)    Protein, ur >=300 (*)    Bacteria, UA RARE (*)    All other components within normal limits  COMPREHENSIVE METABOLIC PANEL - Abnormal; Notable for the following components:   Potassium >7.5 (*)    CO2 18 (*)    BUN 84 (*)    Creatinine, Ser 12.25 (*)    Albumin 2.6 (*)    GFR calc non Af Amer 4 (*)    GFR calc Af Amer 5 (*)    Anion gap 20 (*)    All other components within normal limits  I-STAT CHEM 8, ED - Abnormal; Notable for the following components:   Potassium 7.7 (*)    BUN 77 (*)    Creatinine, Ser 13.50 (*)    Calcium, Ion 1.04 (*)    Hemoglobin 11.2 (*)    HCT 33.0 (*)    All other components within normal limits  URINE CULTURE  SARS CORONAVIRUS 2 BY RT PCR (HOSPITAL ORDER, [place] [facility])  ETHANOL  LACTIC ACID, PLASMA  RAPID URINE DRUG SCREEN, HOSP PERFORMED  HIV ANTIBODY (ROUTINE TESTING W REFLEX)  MAGNESIUM  CBC WITH DIFFERENTIAL/PLATELET  COMPREHENSIVE METABOLIC PANEL  TSH  FOLATE  VITAMIN B12  CBG MONITORING, ED    EKG EKG Interpretation  Date/Time:  Monday September 08 2019 17:59:01 EDT Ventricular Rate:  81 PR Interval:    QRS Duration: 116 QT Interval:  389 QTC Calculation: 452 R Axis:     Text Interpretation: Normal sinus rhythm T wave amplitude has increased in no prior EKG for comparison Confirmed by Dorie Rank [phone]) on 09/08/2019 6:23:52 PM   Radiology CT HEAD WO CONTRAST  Result Date: 09/08/2019 CLINICAL DATA:  Recent seizure activity EXAM: CT HEAD WITHOUT CONTRAST TECHNIQUE: Contiguous axial images were obtained from the base of the skull through the vertex without intravenous contrast. COMPARISON:  08/09/2019 FINDINGS: Brain: No evidence of acute infarction, hemorrhage,  hydrocephalus, extra-axial collection or mass lesion/mass effect. Mild chronic white matter ischemic changes are noted. Vascular: No hyperdense vessel or unexpected calcification. Skull: Normal. Negative for fracture or focal lesion. Sinuses/Orbits:  No acute finding. Other: None. IMPRESSION: No acute intracranial abnormality noted. Electronically Signed   By: Inez Catalina M.D.   On: 09/08/2019 19:34   DG Chest Port 1 View  Result Date: 09/08/2019 CLINICAL DATA:  Seizure. EXAM: PORTABLE CHEST 1 VIEW COMPARISON:  August 09, 2019 (7:31 a.m.) FINDINGS: The endotracheal tube and nasogastric tube seen on the prior study have been removed. A right internal jugular venous catheter is noted with its distal tip at the junction of the superior vena cava and right atrium. This represents a new finding. Mild, diffuse chronic appearing increased lung markings are seen. There is no evidence of acute infiltrate, pleural effusion or pneumothorax. The heart size and mediastinal contours are within normal limits. Multiple chronic right-sided rib fractures are seen. IMPRESSION: Interval right internal jugular venous catheter placement positioning, as described above, without evidence of acute or active cardiopulmonary disease. Electronically Signed   By: Virgina Norfolk M.D.   On: 09/08/2019 18:31    Procedures Procedures (including critical care time)  Medications Ordered in ED Medications - No data to display  ED Course  I have reviewed the triage vital signs and the nursing notes.  Pertinent labs & imaging results that were available during my care of the patient were reviewed by me and considered in my medical decision making (see chart for details).  6:16 PM patient presents by EMS.  Patient seen and examined.  Patient reportedly had a seizure at home.  Patient is confused and oriented to person only cannot give additional history.  He is a dialysis patient.  I called the contact listed in EPIC, a friend named Brand Males, and the person who answered does not know Brand Males.   Additional history was obtained in epic.  Patient had admissions to [facility] in March and again last month.  It appears he had chronic kidney disease which progressed to end-stage renal disease and he was initiated on dialysis during admission a month ago.  Currently dialyzes through right IJ dialysis catheter.    Limited history in regards to seizure disorder, however this was noted on both admissions.  Patient did not have any seizures in March and therefore was not started on antiepileptics.  Patient had a negative EEG in June and antiepileptics were not started.  Discussed with Dr. Tomi Bamberger who has seen patient.  EKG reviewed.   Head CT was negative.  On i-STAT Chem-8, potassium was found to be elevated at 7.7.  Calcium was ordered.  Unfortunately the first CMP was hemolyzed and needed to be redrawn.  Patient rechecked --mental status is improved.  He is now oriented to person and time.  He thought that he was at [facility].  He can tell me that he dialyzes in [place].  This is consistent with what is reported in previous hospital discharge note.  He does not member the name of his nephrologist.  He does tell me that he dialyzed on Saturday (two days ago) which is inconsistent with what was reported by EMS.  CMP with potassium greater than 7.5 without hemolysis.  Additional treatment for hyperkalemia ordered including nebulized albuterol and insulin/D50 (renal dose).   I called nephrology, Dr. Augustin Coupe, who will plan for dialysis tonight.  I spoke with Dr. Cyd Silence who will see and admit to stepdown.    CRITICAL CARE Performed by: Carlisle Cater PA-C Total critical care time: 45 minutes Critical care time was exclusive of separately billable procedures and treating other patients. Critical care was  necessary to treat or prevent imminent or life-threatening deterioration. Critical care was time spent personally by  me on the following activities: development of treatment plan with patient and/or surrogate as well as nursing, discussions with consultants, evaluation of patient's response to treatment, examination of patient, obtaining history from patient or surrogate, ordering and performing treatments and interventions, ordering and review of laboratory studies, ordering and review of radiographic studies, pulse oximetry and re-evaluation of patient's condition.    MDM Rules/Calculators/A&P                          Admit.    Final Clinical Impression(s) / ED Diagnoses Final diagnoses:  Hyperkalemia  Seizure ([employer])  ESRD (end stage renal disease) [facility])    Rx / DC Orders ED Discharge Orders    None       Carlisle Cater, Hershal Coria 09/08/19 2318    Dorie Rank, MD 09/09/19 [phone]

## 2019-09-08 NOTE — ED Notes (Signed)
Pt given a sandwich and soda.

## 2019-09-08 NOTE — ED Notes (Signed)
MD Tomi Bamberger notified of K >8.5.

## 2019-09-08 NOTE — ED Notes (Signed)
Patient transported to Dialysis 6th floor bay 3

## 2019-09-08 NOTE — ED Triage Notes (Signed)
Patient brought in from home via [facility] EMS. EMS reports that pt had a seizure that was witnessed by family that lasted approximately one minute. EMS reports patient didn't have any falls as a result of this seizure. EMS reports the patient is altered after seizure. EMS reports patient is a dialysis patient on Tues, Thurs, Sat, but that he missed his Saturday dialysis appointment. EMS also reports patient is hypertensive.  200/110 90 HR 92% RA 108 CBG

## 2019-09-09 ENCOUNTER — Observation Stay (HOSPITAL_COMMUNITY): Payer: Medicaid Other

## 2019-09-09 ENCOUNTER — Other Ambulatory Visit: Payer: Self-pay

## 2019-09-09 DIAGNOSIS — G9341 Metabolic encephalopathy: Secondary | ICD-10-CM | POA: Diagnosis not present

## 2019-09-09 DIAGNOSIS — E875 Hyperkalemia: Secondary | ICD-10-CM | POA: Diagnosis present

## 2019-09-09 DIAGNOSIS — G40909 Epilepsy, unspecified, not intractable, without status epilepticus: Secondary | ICD-10-CM | POA: Diagnosis present

## 2019-09-09 DIAGNOSIS — G92 Toxic encephalopathy: Secondary | ICD-10-CM | POA: Diagnosis present

## 2019-09-09 DIAGNOSIS — Z20822 Contact with and (suspected) exposure to covid-19: Secondary | ICD-10-CM | POA: Diagnosis present

## 2019-09-09 DIAGNOSIS — B182 Chronic viral hepatitis C: Secondary | ICD-10-CM | POA: Diagnosis present

## 2019-09-09 DIAGNOSIS — Z7951 Long term (current) use of inhaled steroids: Secondary | ICD-10-CM | POA: Diagnosis not present

## 2019-09-09 DIAGNOSIS — Z9119 Patient's noncompliance with other medical treatment and regimen: Secondary | ICD-10-CM | POA: Diagnosis not present

## 2019-09-09 DIAGNOSIS — J449 Chronic obstructive pulmonary disease, unspecified: Secondary | ICD-10-CM | POA: Diagnosis not present

## 2019-09-09 DIAGNOSIS — Z7952 Long term (current) use of systemic steroids: Secondary | ICD-10-CM | POA: Diagnosis not present

## 2019-09-09 DIAGNOSIS — R946 Abnormal results of thyroid function studies: Secondary | ICD-10-CM | POA: Diagnosis present

## 2019-09-09 DIAGNOSIS — Z79899 Other long term (current) drug therapy: Secondary | ICD-10-CM | POA: Diagnosis not present

## 2019-09-09 DIAGNOSIS — F419 Anxiety disorder, unspecified: Secondary | ICD-10-CM | POA: Diagnosis present

## 2019-09-09 DIAGNOSIS — E8770 Fluid overload, unspecified: Secondary | ICD-10-CM | POA: Diagnosis present

## 2019-09-09 DIAGNOSIS — E038 Other specified hypothyroidism: Secondary | ICD-10-CM | POA: Diagnosis present

## 2019-09-09 DIAGNOSIS — Z992 Dependence on renal dialysis: Secondary | ICD-10-CM | POA: Diagnosis not present

## 2019-09-09 DIAGNOSIS — Z01810 Encounter for preprocedural cardiovascular examination: Secondary | ICD-10-CM | POA: Diagnosis not present

## 2019-09-09 DIAGNOSIS — J9601 Acute respiratory failure with hypoxia: Secondary | ICD-10-CM | POA: Diagnosis not present

## 2019-09-09 DIAGNOSIS — I959 Hypotension, unspecified: Secondary | ICD-10-CM | POA: Diagnosis present

## 2019-09-09 DIAGNOSIS — Z880 Allergy status to penicillin: Secondary | ICD-10-CM | POA: Diagnosis not present

## 2019-09-09 DIAGNOSIS — E782 Mixed hyperlipidemia: Secondary | ICD-10-CM | POA: Diagnosis present

## 2019-09-09 DIAGNOSIS — I160 Hypertensive urgency: Secondary | ICD-10-CM | POA: Diagnosis present

## 2019-09-09 DIAGNOSIS — D631 Anemia in chronic kidney disease: Secondary | ICD-10-CM | POA: Diagnosis present

## 2019-09-09 DIAGNOSIS — Z79891 Long term (current) use of opiate analgesic: Secondary | ICD-10-CM | POA: Diagnosis not present

## 2019-09-09 DIAGNOSIS — N186 End stage renal disease: Secondary | ICD-10-CM | POA: Diagnosis present

## 2019-09-09 DIAGNOSIS — I120 Hypertensive chronic kidney disease with stage 5 chronic kidney disease or end stage renal disease: Secondary | ICD-10-CM | POA: Diagnosis present

## 2019-09-09 DIAGNOSIS — N2581 Secondary hyperparathyroidism of renal origin: Secondary | ICD-10-CM | POA: Diagnosis present

## 2019-09-09 LAB — COMPREHENSIVE METABOLIC PANEL
ALT: 13 U/L (ref 0–44)
AST: 17 U/L (ref 15–41)
Albumin: 2.5 g/dL — ABNORMAL LOW (ref 3.5–5.0)
Alkaline Phosphatase: 108 U/L (ref 38–126)
Anion gap: 12 (ref 5–15)
BUN: 20 mg/dL (ref 8–23)
CO2: 26 mmol/L (ref 22–32)
Calcium: 8 mg/dL — ABNORMAL LOW (ref 8.9–10.3)
Chloride: 98 mmol/L (ref 98–111)
Creatinine, Ser: 4.9 mg/dL — ABNORMAL HIGH (ref 0.61–1.24)
GFR calc Af Amer: 14 mL/min — ABNORMAL LOW (ref >60)
GFR calc non Af Amer: 12 mL/min — ABNORMAL LOW (ref >60)
Glucose, Bld: 89 mg/dL (ref 70–99)
Potassium: 4.2 mmol/L (ref 3.5–5.1)
Sodium: 136 mmol/L (ref 135–145)
Total Bilirubin: 0.7 mg/dL (ref 0.3–1.2)
Total Protein: 7.5 g/dL (ref 6.5–8.1)

## 2019-09-09 LAB — CBC WITH DIFFERENTIAL/PLATELET
Abs Immature Granulocytes: 0.04 10*3/uL (ref 0.00–0.07)
Basophils Absolute: 0.1 10*3/uL (ref 0.0–0.1)
Basophils Relative: 1 %
Eosinophils Absolute: 0.2 10*3/uL (ref 0.0–0.5)
Eosinophils Relative: 2 %
HCT: 34.7 % — ABNORMAL LOW (ref 39.0–52.0)
Hemoglobin: 11.1 g/dL — ABNORMAL LOW (ref 13.0–17.0)
Immature Granulocytes: 1 %
Lymphocytes Relative: 25 %
Lymphs Abs: 2.1 10*3/uL (ref 0.7–4.0)
MCH: 29 pg (ref 26.0–34.0)
MCHC: 32 g/dL (ref 30.0–36.0)
MCV: 90.6 fL (ref 80.0–100.0)
Monocytes Absolute: 0.6 10*3/uL (ref 0.1–1.0)
Monocytes Relative: 7 %
Neutro Abs: 5.5 10*3/uL (ref 1.7–7.7)
Neutrophils Relative %: 64 %
Platelets: 261 10*3/uL (ref 150–400)
RBC: 3.83 MIL/uL — ABNORMAL LOW (ref 4.22–5.81)
RDW: 18 % — ABNORMAL HIGH (ref 11.5–15.5)
WBC: 8.6 10*3/uL (ref 4.0–10.5)
nRBC: 0 % (ref 0.0–0.2)

## 2019-09-09 LAB — HEPATITIS B SURFACE ANTIGEN: Hepatitis B Surface Ag: NONREACTIVE

## 2019-09-09 LAB — FOLATE: Folate: 41.4 ng/mL (ref >5.9)

## 2019-09-09 LAB — HIV ANTIBODY (ROUTINE TESTING W REFLEX): HIV Screen 4th Generation wRfx: NONREACTIVE

## 2019-09-09 LAB — MAGNESIUM: Magnesium: 1.6 mg/dL — ABNORMAL LOW (ref 1.7–2.4)

## 2019-09-09 LAB — TSH: TSH: 8.564 u[IU]/mL — ABNORMAL HIGH (ref 0.350–4.500)

## 2019-09-09 LAB — VITAMIN B12: Vitamin B-12: 799 pg/mL (ref 180–914)

## 2019-09-09 MED ORDER — HEPARIN SODIUM (PORCINE) 1000 UNIT/ML IJ SOLN
INTRAMUSCULAR | Status: AC
  Administered 2019-09-09: 3600 [IU]
  Filled 2019-09-09: qty 4

## 2019-09-09 MED ORDER — SODIUM CHLORIDE 0.9 % IV BOLUS
500.0000 mL | Freq: Once | INTRAVENOUS | Status: AC
  Administered 2019-09-09: 500 mL via INTRAVENOUS

## 2019-09-09 MED ORDER — CALCITRIOL 0.25 MCG PO CAPS
0.2500 ug | ORAL_CAPSULE | ORAL | Status: DC
  Administered 2019-09-09 – 2019-09-16 (×5): 0.25 ug via ORAL
  Filled 2019-09-09 (×6): qty 1

## 2019-09-09 MED ORDER — MAGNESIUM SULFATE 2 GM/50ML IV SOLN
2.0000 g | Freq: Once | INTRAVENOUS | Status: AC
  Administered 2019-09-09: 2 g via INTRAVENOUS
  Filled 2019-09-09: qty 50
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
## 2019-09-09 NOTE — Progress Notes (Addendum)
PROGRESS NOTE    Dearies Meikle  ZJQ:[id number] DOB: 13-Jan-1961 DOA: 09/08/2019 PCP: Patient, No Pcp Per   Brief Narrative:  Patient is a 59 year old male with history of COPD, end-stage renal disease on dialysis on TTS, hypertension, hyperlipidemia, nicotine dependence, hepatitis C, seizure disorder but not antiepileptics, chronic back pain who presented to the emergency department via EMS brought after suspected episode of seizure.  Patient was unable to contribute any information due to confusion.  He briefly was able to explain that he was sitting in the wheelchair when he experienced started feeling shaky.  As per ED notes, seizure lasted for 1 minute.  Upon further questioning, patient admitted that he missed several sessions of dialysis.  On presentation his potassium was 7.7.  Patient was given calcium gluconate, insulin, dextrose and Lokelma.  Nephrology consulted and he underwent emergent dialysis.  Neurology was also consulted regarding suspicion for seizure but not started on antiepileptics due to uremia.  Assessment & Plan:   Principal Problem:   Hyperkalemia Active Problems:   Mixed hyperlipidemia   Acute metabolic encephalopathy   Hypertensive urgency   Seizure (HCC)   COPD (chronic obstructive pulmonary disease) (HCC)   Hyperkalemia: Presented with potassium of more than 7.  He was given calcium gluconate, insulin, dextrose, Lokelma.  Monitor on stepdown.  He underwent emergent dialysis.  Potassium level is currently normal.  We will continue to monitor potassium level.  ESRD on dialysis: Typically dialyzed on TTS is scheduled.  Nephrology following.  Emergent dialysis has been done here.  He said he missed a session of dialysis, he has history of noncompliance.  Nephrology following here for dialysis.  Acute metabolic encephalopathy: Secondary to uremic encephalopathy.Monitor mental status.  CT head was unremarkable.  Normal folic acid, vitamin W40.  TSH is mildly elevated.   Will check free T4.  Mental status has improved.  Currently he is alert and awake but could not tell the correct month.  CT head did not show any acute intracranial abnormality.  Hypertensive urgency: Presented with severe hypertension most likely secondary to missing dialysis.  Improved after dialysis.  Started home regimen.  Seizure: As per the EMS report, he had 1 minute of seizure activity.  He had history of seizure disorder but was not in any kind of seizure medications.  Patient was hospitalized at [facility] in March for seizure but was not sent home on any medications.  That could have been precipitated also due to uremia.   Case discussed with neurology.  Since seizure could have been precipitated from uremia, AED not started.  EEG was consistent with toxic/metabolic encephalopathy, no definite epileptiform discharges.  Neurology following.  Hyperlipidemia: Continue statin  COPD: Mild wheezing was noted on presentation.  Continue bronchodilator therapy.  Currently on 2 L of oxygen per minute.  Not on oxygen at home.  We will try to wean the oxygen  Smoker: Smokes half pack a day.  Counseled for cessation.  Hypomagnesemia: Supplemented with Mg            DVT prophylaxis:Heparin [place] Code Status: Full Family Communication: None present at the bed side Status is: Observation  The patient remains OBS appropriate and will d/c before 2 midnights.  Dispo: The patient is from: Home              Anticipated d/c is to: Home              Anticipated d/c date is: 2 days  Patient currently is not medically stable to d/c.     Consultants: Nephrology, urology  Procedures:None  Antimicrobials:  Anti-infectives (From admission, onward)   None      Subjective: Patient seen and examined at the bedside this morning.  Currently hemodynamically stable.  Looks comfortable during my evaluation but still intermittently confused.  Alert and awake, follows  commands but speech is slow.  Objective: Vitals:   09/09/19 0330 09/09/19 0400 09/09/19 0508 09/09/19 0733  BP: 110/60 132/86 (!) 133/99   Pulse: 75     Resp:  18    Temp:  98.3 F (36.8 C) 98.3 F (36.8 C)   TempSrc:  Oral Oral   SpO2:  97%  93%  Weight:  74.8 kg    Height:        Intake/Output Summary (Last 24 hours) at 09/09/2019 0738 Last data filed at 09/09/2019 0400 Gross per 24 hour  Intake --  Output 2500 ml  Net -2500 ml   Filed Weights   09/08/19 1853 09/09/19 0400  Weight: 74.8 kg 74.8 kg    Examination:  General exam: Appears calm and comfortable ,Not in distress,average built HEENT:PERRL,Oral mucosa moist, Ear/Nose normal on gross exam Respiratory system: Bilateral decreased air entry but no obvious no wheezes or crackles .  Dialysis catheter on the right chest Cardiovascular system: S1 & S2 heard, RRR. No JVD, murmurs, rubs, gallops or clicks. No pedal edema. Gastrointestinal system: Abdomen is nondistended, soft and nontender. No organomegaly or masses felt. Normal bowel sounds heard. Central nervous system: Alert and awake.  Oriented to place and person but not time Extremities: No edema, no clubbing ,no cyanosis Skin: No rashes, lesions or ulcers,no icterus ,no pallor  Data Reviewed: I have personally reviewed following labs and imaging studies  CBC: Recent Labs  Lab 09/08/19 1904 09/08/19 1951 09/09/19 0615  WBC 9.8  --  8.6  NEUTROABS 7.5  --  5.5  HGB 10.7* 11.2* 11.1*  HCT 35.3* 33.0* 34.7*  MCV 92.9  --  90.6  PLT 277  --  326   Basic Metabolic Panel: Recent Labs  Lab 09/08/19 1951 09/08/19 2013 09/09/19 0615  NA 135 137 136  K 7.7* >7.5* 4.2  CL 104 99 98  CO2  --  18* 26  GLUCOSE 89 81 89  BUN 77* 84* 20  CREATININE 13.50* 12.25* 4.90*  CALCIUM  --  9.1 8.0*  MG  --   --  1.6*   GFR: Estimated Creatinine Clearance: 16.7 mL/min (A) (by C-G formula based on SCr of 4.9 mg/dL (H)). Liver Function Tests: Recent Labs  Lab  09/08/19 2013 09/09/19 0615  AST 15 17  ALT 12 13  ALKPHOS 100 108  BILITOT 0.7 0.7  PROT 7.3 7.5  ALBUMIN 2.6* 2.5*   No results for input(s): LIPASE, AMYLASE in the last 168 hours. Recent Labs  Lab 09/08/19 0704  AMMONIA 36*   Coagulation Profile: No results for input(s): INR, PROTIME in the last 168 hours. Cardiac Enzymes: No results for input(s): CKTOTAL, CKMB, CKMBINDEX, TROPONINI in the last 168 hours. BNP (last 3 results) No results for input(s): PROBNP in the last 8760 hours. HbA1C: No results for input(s): HGBA1C in the last 72 hours. CBG: Recent Labs  Lab 09/08/19 1947  GLUCAP 86   Lipid Profile: No results for input(s): CHOL, HDL, LDLCALC, TRIG, CHOLHDL, LDLDIRECT in the last 72 hours. Thyroid Function Tests: No results for input(s): TSH, T4TOTAL, FREET4, T3FREE, THYROIDAB in the last 72  hours. Anemia Panel: No results for input(s): VITAMINB12, FOLATE, FERRITIN, TIBC, IRON, RETICCTPCT in the last 72 hours. Sepsis Labs: Recent Labs  Lab 09/08/19 1945  LATICACIDVEN 1.1    Recent Results (from the past 240 hour(s))  SARS Coronavirus 2 by RT PCR (hospital order, performed in [facility] hospital lab) Nasopharyngeal Nasopharyngeal Swab     Status: None   Collection Time: 09/08/19 10:11 PM   Specimen: Nasopharyngeal Swab  Result Value Ref Range Status   SARS Coronavirus 2 NEGATIVE NEGATIVE Final    Comment: (NOTE) SARS-CoV-2 target nucleic acids are NOT DETECTED.  The SARS-CoV-2 RNA is generally detectable in upper and lower respiratory specimens during the acute phase of infection. The lowest concentration of SARS-CoV-2 viral copies this assay can detect is 250 copies / mL. A negative result does not preclude SARS-CoV-2 infection and should not be used as the sole basis for treatment or other patient management decisions.  A negative result may occur with improper specimen collection / handling, submission of specimen other than nasopharyngeal swab,  presence of viral mutation(s) within the areas targeted by this assay, and inadequate number of viral copies (<250 copies / mL). A negative result must be combined with clinical observations, patient history, and epidemiological information.  Fact Sheet for Patients:   StrictlyIdeas.no  Fact Sheet for Healthcare Providers: BankingDealers.co.za  This test is not yet approved or  cleared by the [place] FDA and has been authorized for detection and/or diagnosis of SARS-CoV-2 by FDA under an Emergency Use Authorization (EUA).  This EUA will remain in effect (meaning this test can be used) for the duration of the COVID-19 declaration under Section 564(b)(1) of the Act, 21 U.S.C. section 360bbb-3(b)(1), unless the authorization is terminated or revoked sooner.  Performed at [facility], [place] [address], [place], [place] [address]          Radiology Studies: CT HEAD WO CONTRAST  Result Date: 09/08/2019 CLINICAL DATA:  Recent seizure activity EXAM: CT HEAD WITHOUT CONTRAST TECHNIQUE: Contiguous axial images were obtained from the base of the skull through the vertex without intravenous contrast. COMPARISON:  08/09/2019 FINDINGS: Brain: No evidence of acute infarction, hemorrhage, hydrocephalus, extra-axial collection or mass lesion/mass effect. Mild chronic white matter ischemic changes are noted. Vascular: No hyperdense vessel or unexpected calcification. Skull: Normal. Negative for fracture or focal lesion. Sinuses/Orbits: No acute finding. Other: None. IMPRESSION: No acute intracranial abnormality noted. Electronically Signed   By: Inez Catalina M.D.   On: 09/08/2019 19:34   DG Chest Port 1 View  Result Date: 09/08/2019 CLINICAL DATA:  Seizure. EXAM: PORTABLE CHEST 1 VIEW COMPARISON:  August 09, 2019 (7:31 a.m.) FINDINGS: The endotracheal tube and nasogastric tube seen on the prior study have been removed. A right internal jugular  venous catheter is noted with its distal tip at the junction of the superior vena cava and right atrium. This represents a new finding. Mild, diffuse chronic appearing increased lung markings are seen. There is no evidence of acute infiltrate, pleural effusion or pneumothorax. The heart size and mediastinal contours are within normal limits. Multiple chronic right-sided rib fractures are seen. IMPRESSION: Interval right internal jugular venous catheter placement positioning, as described above, without evidence of acute or active cardiopulmonary disease. Electronically Signed   By: Virgina Norfolk M.D.   On: 09/08/2019 18:31        Scheduled Meds: . amLODipine  10 mg Oral Daily  . atorvastatin  40 mg Oral Daily  . budesonide  0.5 mg Inhalation BID  . Chlorhexidine Gluconate Cloth  6 each Topical Q0600  . gabapentin  300 mg Oral Daily  . heparin  5,000 Units Subcutaneous Q8H  . hydrALAZINE  50 mg Oral BID  . losartan  50 mg Oral Daily  . metoprolol tartrate  25 mg Oral BID  . sodium bicarbonate  650 mg Oral BID  . tamsulosin  0.4 mg Oral QHS   Continuous Infusions:   LOS: 0 days    Time spent: 35 mins,More than 50% of that time was spent in counseling and/or coordination of care.      Shelly Coss, MD [facility] P7/08/2019, 7:38 AM

## 2019-09-09 NOTE — Progress Notes (Signed)
Pt was trialled on RA. His O2 level immediately desated from 91% to 87%. He resumed 2L O2 and O2 maintained 92% or above. Denies SOB, labored breathing or CP.

## 2019-09-09 NOTE — Procedures (Signed)
History: 59 year old male who missed some dialysis and presented with a BUN of 100 and encephalopathy.  In the setting, he had seizure-like activity.  Sedation: None  Technique: This is a 21 channel routine scalp EEG performed at the bedside with bipolar and monopolar montages arranged in accordance to the international 10/20 system of electrode placement. One channel was dedicated to EKG recording.    Background: There is a posterior dominant rhythm of 7 - 8Hz , however the background is dominated by generalized irregular delta and theta range slow activity.  There is also intermittent frontally predominant rhythmic delta range activity(FIRDA) without evolution.  There are frequent triphasic morphology discharges with bifrontal predominance.  There is excessive beta which at times does give a sharply contoured appearance to the background as well.  He has frequent myoclonus which is associated with muscle artifact on EEG.  Photic stimulation: Physiologic driving is not performed  EEG Abnormalities: 1) triphasic waves 2) frontal intermittent rhythmic delta activity (FIRDA) 3) generalized irregular slow activity 4) slow PDR  Clinical Interpretation: This EEG is most consistent with a toxic/metabolic encephalopathy.  The triphasic discharges are very sharply contoured at times, and these can represent cortical irritability, but I do not feel there are any definite epileptiform discharges.  If high concern for epilepsy remains, could consider repeating the study after further dialysis/correction of metabolic derangements.  Roland Rack, MD [facility] [phone]  If 7pm- 7am, please page neurology on call as listed in [place].

## 2019-09-09 NOTE — Social Work (Signed)
CSW acknowledging consult for [facility] and DME. Will follow for therapy recommendations needed to best determine disposition/for insurance authorization.   Westley Hummer, MSW, [place] Work

## 2019-09-09 NOTE — Progress Notes (Signed)
EEG Completed; Results Pending  

## 2019-09-09 NOTE — NC FL2 (Signed)
[place] LEVEL OF CARE SCREENING TOOL     IDENTIFICATION  Patient Name: Ralph Becker Birthdate: 11-19-1960 Sex: male Admission Date (Current Location): 09/08/2019  [place] and [place] Number:  [name] [id number]- [place] and Address:  [facility], [place] [address], [place], [place] [address]      Provider Number: [id number]  Attending Physician Name and Address:  Shelly Coss, MD  Relative Name and Phone Number:       Current Level of Care: Hospital Recommended Level of Care: [place] Prior Approval Number:    Date Approved/Denied:   PASRR Number: [id number]  Discharge Plan: SNF    Current Diagnoses: Patient Active Problem List   Diagnosis Date Noted  . Hyperkalemia 09/08/2019  . Seizure disorder ([place]) 09/08/2019  . Acute metabolic encephalopathy 70/03/7492  . Hypertensive urgency 09/08/2019  . Seizure ([place]) 09/08/2019  . COPD (chronic obstructive pulmonary disease) ([place]) 09/08/2019  . Mixed hyperlipidemia   . Essential hypertension     Orientation RESPIRATION BLADDER Height & Weight     Self, Situation, Place  Normal, O2 (intermittant 2L nasal canula) Continent Weight: 164 lb 14.5 oz (74.8 kg) Height:  6' (182.9 cm)  BEHAVIORAL SYMPTOMS/MOOD NEUROLOGICAL BOWEL NUTRITION STATUS    Convulsions/Seizures Continent Diet (see discharge summary)  AMBULATORY STATUS COMMUNICATION OF NEEDS Skin   Extensive Assist Verbally Normal (within defined limits)                       Personal Care Assistance Level of Assistance  Feeding, Bathing, Dressing Bathing Assistance: Maximum assistance Feeding assistance: Independent Dressing Assistance: Maximum assistance     Functional Limitations Info  Hearing, Sight, Speech Sight Info: Adequate Hearing Info: Adequate Speech Info: Adequate    SPECIAL CARE FACTORS FREQUENCY  PT (By licensed PT),  OT (By licensed OT)     PT Frequency: 5x week OT Frequency: 5x week            Contractures Contractures Info: Not present    Additional Factors Info  Code Status, Allergies Code Status Info: Full Code Allergies Info: Penicillins           Current Medications (09/09/2019):  This is the current hospital active medication list Current Facility-Administered Medications  Medication Dose Route Frequency Provider Last Rate Last Admin  . acetaminophen (TYLENOL) tablet 650 mg  650 mg Oral Q6H PRN Shalhoub, Sherryll Burger, MD       Or  . acetaminophen (TYLENOL) suppository 650 mg  650 mg Rectal Q6H PRN Shalhoub, Sherryll Burger, MD      . amLODipine (NORVASC) tablet 10 mg  10 mg Oral Daily Shalhoub, Sherryll Burger, MD   10 mg at 09/09/19 0900  . atorvastatin (LIPITOR) tablet 40 mg  40 mg Oral Daily Shalhoub, Sherryll Burger, MD   40 mg at 09/09/19 0901  . budesonide (PULMICORT) nebulizer solution 0.5 mg  0.5 mg Inhalation BID Vernelle Emerald, MD   0.5 mg at 09/09/19 0732  . calcitRIOL (ROCALTROL) capsule 0.25 mcg  0.25 mcg Oral Q T,Th,Sat-1800 Rosita Fire, MD      . Chlorhexidine Gluconate Cloth 2 % PADS 6 each  6 each Topical Q0600 Shalhoub, Sherryll Burger, MD   6 each at 09/09/19 [phone]  . gabapentin (NEURONTIN) capsule 300 mg  300 mg Oral Daily Shalhoub, Sherryll Burger, MD   300 mg at 09/09/19 0902  . heparin injection 5,000  Units  5,000 Units Subcutaneous Q8H Shalhoub, Sherryll Burger, MD   5,000 Units at 09/09/19 1556  . hydrALAZINE (APRESOLINE) injection 10 mg  10 mg Intravenous Q6H PRN Shalhoub, Sherryll Burger, MD      . hydrALAZINE (APRESOLINE) tablet 50 mg  50 mg Oral BID Vernelle Emerald, MD   50 mg at 09/09/19 0901  . ipratropium-albuterol (DUONEB) 0.5-2.5 (3) MG/3ML nebulizer solution 3 mL  3 mL Nebulization Q4H PRN Shalhoub, Sherryll Burger, MD      . losartan (COZAAR) tablet 50 mg  50 mg Oral Daily Shalhoub, Sherryll Burger, MD   50 mg at 09/09/19 0901  . metoprolol tartrate (LOPRESSOR) tablet 25 mg  25 mg Oral BID Vernelle Emerald, MD   25 mg at 09/09/19 0901  . ondansetron (ZOFRAN) tablet 4 mg  4 mg Oral Q6H PRN Shalhoub, Sherryll Burger, MD       Or  . ondansetron [facility]) injection 4 mg  4 mg Intravenous Q6H PRN Shalhoub, Sherryll Burger, MD      . polyethylene glycol (MIRALAX / GLYCOLAX) packet 17 g  17 g Oral Daily PRN Shalhoub, Sherryll Burger, MD      . tamsulosin (FLOMAX) capsule 0.4 mg  0.4 mg Oral QHS Shalhoub, Sherryll Burger, MD   0.4 mg at 09/08/19 2305     Discharge Medications: Please see discharge summary for a list of discharge medications.  Relevant Imaging Results:  Relevant Lab Results:   Additional Information SS#[phone]; [facility] at [place], [place]

## 2019-09-09 NOTE — Evaluation (Signed)
Physical Therapy Evaluation Patient Details Name: Ralph Becker MRN: [id number] DOB: [birth year] Today's Date: 09/09/2019   History of Present Illness  59yo male presenting to the ED s/p seizure. Reports multiple HD sessions. Admitted with hyperkalemia, hypertensive urgency, seizure. PMH hepatitis C, COPD, ESRD on HD, HTN, HLD, seizure disorder, hx multiple TBIs  Clinical Impression   Patient received in bed, pleasant and willing to work with therapy but very confused- looking for his wallet which is not in the room that PT and staff earlier were able to find, insistent that he does not need oxygen even though per nursing notes he desats rapidly on room air, and with very poor awareness and insight into current deficits. See below for physical assist levels. Generally needed Mod-maxA once up on his feet even with RW to maintain safety and balance and prevent a fall. Had pretty continuous jerking during session which definitely impacted his balance. SpO2 no lower than 90% on 2LPM per Ralph Becker. Left in bed with all needs met, bed alarm active. Would strongly and emphatically recommend SNF due to very high fall risk if left to his own devices alone at home.   Patient suffers from impaired balance, unsafe gait pattern, gross weakness, poor safety awareness, and high fall risk which impairs their ability to perform daily activities like ambulate safely in the home.  A walker alone will not resolve the issues with performing activities of daily living. A wheelchair will allow patient to safely perform daily activities.  The patient can self propel in the home or has a caregiver who can provide assistance.        Follow Up Recommendations SNF;Supervision/Assistance - 24 hour    Equipment Recommendations  Rolling walker with 5" wheels;3in1 (PT);Wheelchair (measurements PT);Wheelchair cushion (measurements PT)    Recommendations for Other Services       Precautions / Restrictions Precautions Precautions:  Fall;Other (comment) Precaution Comments: watch O2, has clonic jerking movements, VERY unsteady Restrictions Weight Bearing Restrictions: No      Mobility  Bed Mobility Overal bed mobility: Needs Assistance Bed Mobility: Supine to Sit;Sit to Supine     Supine to sit: Supervision Sit to supine: Supervision   General bed mobility comments: S for line management and safety  Transfers Overall transfer level: Needs assistance Equipment used: Rolling walker (2 wheeled) Transfers: Sit to/from [facility] Sit to Stand: Min guard Stand pivot transfers: Mod assist       General transfer comment: Min guard to boost to standing, but needed heavy ModA to maintain safety due to jerking and poor balance  Ambulation/Gait Ambulation/Gait assistance: Max assist Gait Distance (Feet): 5 Feet Assistive device: Rolling walker (2 wheeled) Gait Pattern/deviations: Step-to pattern;Decreased stance time - left;Decreased stance time - right;Decreased stride length;Decreased dorsiflexion - right;Decreased dorsiflexion - left;Staggering right;Trunk flexed;Staggering left;Narrow base of support;Scissoring Gait velocity: decreased   General Gait Details: extremely unsteady when walking in room with and without RW- unable to maintain balance safely even with RW and PT eventually had to pull chair up behind him for safety and scoot back to bed in the chair  Stairs            Wheelchair Mobility    Modified Rankin (Stroke Patients Only)       Balance Overall balance assessment: Needs assistance Sitting-balance support: No upper extremity supported;Feet supported Sitting balance-Leahy Scale: Good Sitting balance - Comments: able to put on socks sitting EOB but jerks   Standing balance support: Bilateral upper extremity supported;During functional activity  Standing balance-Leahy Scale: Poor Standing balance comment: MaxA to maintain balance even with RW                              Pertinent Vitals/Pain Pain Assessment: No/denies pain    Home Living Family/patient expects to be discharged to:: Private residence Living Arrangements: Non-relatives/Friends Available Help at Discharge: Friend(s);Available PRN/intermittently Type of Home: Other(Comment) (double wide trailer) Home Access: Stairs to enter Entrance Stairs-Rails: Can reach both Entrance Stairs-Number of Steps: 2-3 Home Layout: One level Home Equipment: Other (comment) (unclear) Additional Comments: patient unclear on his answers- first states that he has no equipment, then states he has quad cane and WC, then states no equipment    Prior Function Level of Independence: Independent         Comments: unsure of accuracy of information due to confusion     Hand Dominance        Extremity/Trunk Assessment   Upper Extremity Assessment Upper Extremity Assessment: Defer to OT evaluation    Lower Extremity Assessment Lower Extremity Assessment: Generalized weakness    Cervical / Trunk Assessment Cervical / Trunk Assessment: Kyphotic  Communication   Communication: No difficulties  Cognition Arousal/Alertness: Awake/alert Behavior During Therapy: Flat affect;Impulsive Overall Cognitive Status: No family/caregiver present to determine baseline cognitive functioning Area of Impairment: Safety/judgement;Awareness;Problem solving;Memory                     Memory: Decreased short-term memory;Decreased recall of precautions   Safety/Judgement: Decreased awareness of safety;Decreased awareness of deficits Awareness: Intellectual Problem Solving: Slow processing;Decreased initiation;Difficulty sequencing;Requires verbal cues General Comments: patient very unreliable historian, states he has a wallet but PT unable to find wallet, reports his oxygen is fine without nasal cannula (immediately desats per nursing notes), gives very inconsistent answers as to home equipment and  performance      General Comments      Exercises     Assessment/Plan    PT Assessment Patient needs continued PT services  PT Problem List Decreased strength;Decreased cognition;Decreased knowledge of use of DME;Decreased activity tolerance;Decreased safety awareness;Decreased balance;Decreased knowledge of precautions;Decreased mobility;Decreased coordination       PT Treatment Interventions DME instruction;Balance training;Gait training;Stair training;Cognitive remediation;Functional mobility training;Patient/family education;Therapeutic activities;Therapeutic exercise;Wheelchair mobility training    PT Goals (Current goals can be found in the Care Plan section)  Acute Rehab PT Goals Patient Stated Goal: go home asap PT Goal Formulation: With patient Time For Goal Achievement: 09/23/19 Potential to Achieve Goals: Fair    Frequency Min 2X/week   Barriers to discharge Inaccessible home environment;Decreased caregiver support per his (unreliable) report, he lives with room mates but is by himself a good part of the day, also would have to do stairs to enter home    Co-evaluation               AM-PAC PT "6 Clicks" Mobility  Outcome Measure Help needed turning from your back to your side while in a flat bed without using bedrails?: A Little Help needed moving from lying on your back to sitting on the side of a flat bed without using bedrails?: A Little Help needed moving to and from a bed to a chair (including a wheelchair)?: A Lot Help needed standing up from a chair using your arms (e.g., wheelchair or bedside chair)?: A Lot Help needed to walk in hospital room?: A Lot Help needed climbing 3-5 steps with a railing? : Total  6 Click Score: 13    End of Session Equipment Utilized During Treatment: Gait belt;Oxygen Activity Tolerance: Patient tolerated treatment well Patient left: in bed;with call bell/phone within reach;with bed alarm set Nurse Communication: Mobility  status;Precautions PT Visit Diagnosis: Unsteadiness on feet (R26.81);Muscle weakness (generalized) (M62.81);Difficulty in walking, not elsewhere classified (R26.2)    Time: 4621-9471 PT Time Calculation (min) (ACUTE ONLY): 26 min   Charges:   PT Evaluation $PT Eval High Complexity: 1 High (medicaid eval)          Windell Norfolk, DPT, PN1   Supplemental Physical Therapist [place]    Pager [phone] Acute Rehab Office [phone]

## 2019-09-09 NOTE — Evaluation (Signed)
Occupational Therapy Evaluation Patient Details Name: Ralph Becker MRN: [id number] DOB: 1960/07/09 Today's Date: 09/09/2019    History of Present Illness 59yo male presenting to the ED s/p seizure. Reports multiple HD sessions. Admitted with hyperkalemia, hypertensive urgency, seizure. PMH hepatitis C, COPD, ESRD on HD, HTN, HLD, seizure disorder, hx multiple TBIs   Clinical Impression   Pt PTA: Pt living with roommates and reports independence with ADL and mobility prior. Pt currently limited by tremors/jerking motions, decreased cognition, decreased ability to care for self and decreased mobility.  Pt minA to [place] for ADL and modA overall for mobility to stand. Pt unable to safely take steps today. Tremors in BUEs for functional tasks. Pt would benefit from continued OT skilled services. OT following acutely. O2 >98% on 2L O2; 77-89 BPM throughout exertion      Follow Up Recommendations  SNF;Supervision/Assistance - 24 hour    Equipment Recommendations  Other (comment) (defer to next facility)    Recommendations for Other Services       Precautions / Restrictions Precautions Precautions: Fall;Other (comment) Precaution Comments: watch O2, has clonic jerking movements, VERY unsteady Restrictions Weight Bearing Restrictions: No      Mobility Bed Mobility Overal bed mobility: Needs Assistance Bed Mobility: Supine to Sit;Sit to Supine     Supine to sit: Supervision Sit to supine: Supervision   General bed mobility comments: use of rail for stability; line management  Transfers Overall transfer level: Needs assistance Equipment used: Rolling walker (2 wheeled) Transfers: Sit to/from Stand Sit to Stand: Min assist Stand pivot transfers: Mod assist       General transfer comment: minA overall for stability in standing and for power up    Balance Overall balance assessment: Needs assistance Sitting-balance support: No upper extremity supported;Feet supported Sitting  balance-Leahy Scale: Good Sitting balance - Comments: grooming at EOB   Standing balance support: Bilateral upper extremity supported;During functional activity Standing balance-Leahy Scale: Poor Standing balance comment: MaxA to maintain balance even with RW                           ADL either performed or assessed with clinical judgement   ADL Overall ADL's : Needs assistance/impaired Eating/Feeding: Modified independent;Sitting   Grooming: Set up;Sitting   Upper Body Bathing: Minimal assistance;Sitting   Lower Body Bathing: Moderate assistance;Sitting/lateral leans;Sit to/from stand;Cueing for safety   Upper Body Dressing : Minimal assistance;Sitting   Lower Body Dressing: Moderate assistance;Cueing for safety;Sitting/lateral leans;Sit to/from stand   Toilet Transfer: Moderate assistance;Stand-pivot;BSC   Toileting- Clothing Manipulation and Hygiene: Moderate assistance;Sitting/lateral lean;Sit to/from stand       Functional mobility during ADLs: Moderate assistance;Cueing for safety General ADL Comments: Pt limited by tremors/jerking motions, decreased cognition, decreased ability to care for self and decreased mobility.      Vision Baseline Vision/History: No visual deficits Patient Visual Report: No change from baseline Vision Assessment?: Yes Tracking/Visual Pursuits: Able to track stimulus in all quads without difficulty Additional Comments: continue to assess- able to scan well     Perception     Praxis      Pertinent Vitals/Pain Pain Assessment: No/denies pain     Hand Dominance     Extremity/Trunk Assessment Upper Extremity Assessment Upper Extremity Assessment: Generalized weakness;RUE deficits/detail;LUE deficits/detail RUE Deficits / Details: tremors noted RUE Coordination: decreased fine motor;decreased gross motor LUE Deficits / Details: tremors noted LUE Coordination: decreased fine motor;decreased gross motor   Lower Extremity  Assessment Lower Extremity  Assessment: Generalized weakness   Cervical / Trunk Assessment Cervical / Trunk Assessment: Kyphotic   Communication Communication Communication: No difficulties   Cognition Arousal/Alertness: Awake/alert Behavior During Therapy: Flat affect Overall Cognitive Status: No family/caregiver present to determine baseline cognitive functioning Area of Impairment: Safety/judgement;Awareness;Problem solving;Memory                     Memory: Decreased short-term memory;Decreased recall of precautions   Safety/Judgement: Decreased awareness of safety;Decreased awareness of deficits Awareness: Intellectual Problem Solving: Slow processing;Decreased initiation;Difficulty sequencing;Requires verbal cues General Comments: Pt appeared calm, but did not appear impulsive. Pt could have delayed processing and unable to process questions to full ability    General Comments  O2 >98% on 2L O2; 77-89 BPM throughout exertion    Exercises     Shoulder Instructions      Home Living Family/patient expects to be discharged to:: Private residence Living Arrangements: Non-relatives/Friends Available Help at Discharge: Friend(s);Available PRN/intermittently Type of Home: Other(Comment) Home Access: Stairs to enter Entrance Stairs-Number of Steps: 2-3 Entrance Stairs-Rails: Can reach both Home Layout: One level     Bathroom Shower/Tub: Teacher, early years/pre: Standard Bathroom Accessibility: Yes   Home Equipment: Other (comment)   Additional Comments: Pt with poor information processing at this time; unable to obtain all data as true.      Prior Functioning/Environment Level of Independence: Independent        Comments: Pt performed ADL tasks and mobility with independence; pt reports that he does not drive. Pt has friends or public transport.        OT Problem List: Decreased strength;Decreased activity tolerance;Impaired balance (sitting  and/or standing);Decreased safety awareness;Impaired UE functional use;Decreased cognition;Decreased coordination;Impaired vision/perception      OT Treatment/Interventions: Self-care/ADL training;Therapeutic exercise;Energy conservation;Neuromuscular education;Therapeutic activities;Cognitive remediation/compensation;Visual/perceptual remediation/compensation;Patient/family education;Balance training    OT Goals(Current goals can be found in the care plan section) Acute Rehab OT Goals Patient Stated Goal: go home after I stop shaking OT Goal Formulation: With patient Time For Goal Achievement: 09/23/19 Potential to Achieve Goals: Good ADL Goals Pt Will Perform Grooming: with supervision;standing Pt Will Perform Lower Body Dressing: with supervision;sit to/from stand Pt Will Transfer to Toilet: ambulating;with supervision Additional ADL Goal #1: Pt will follow 3 multi-step commands to engage higher level cognition in a non distracting environment with <2 verbal cues to attend to task. Additional ADL Goal #2: Pt will utilize/recall 3 energy conservation techniques for ADL/mobility tasks in order to increase endurance.  OT Frequency: Min 2X/week   Barriers to D/C: Decreased caregiver support          Co-evaluation              AM-PAC OT "6 Clicks" Daily Activity     Outcome Measure Help from another person eating meals?: A Little Help from another person taking care of personal grooming?: A Little Help from another person toileting, which includes using toliet, bedpan, or urinal?: A Lot Help from another person bathing (including washing, rinsing, drying)?: A Lot Help from another person to put on and taking off regular upper body clothing?: A Little Help from another person to put on and taking off regular lower body clothing?: A Lot 6 Click Score: 15   End of Session Equipment Utilized During Treatment: Gait belt;Oxygen Nurse Communication: Mobility status  Activity  Tolerance: Treatment limited secondary to medical complications (Comment);Patient limited by fatigue Patient left: in bed;with call bell/phone within reach;with bed alarm set  OT Visit Diagnosis:  Unsteadiness on feet (R26.81);Muscle weakness (generalized) (M62.81);Other symptoms and signs involving cognitive function                Time: 0447-1580 OT Time Calculation (min): 27 min Charges:  OT General Charges $OT Visit: 1 Visit OT Evaluation $OT Eval Moderate Complexity: 1 Mod OT Treatments $Self Care/Home Management : 8-22 mins  Jefferey Pica, OTR/L Acute Rehabilitation Services Pager: [phone] Office: [phone]  [place] C 09/09/2019, 3:13 PM

## 2019-09-09 NOTE — Progress Notes (Signed)
[facility] NEPHROLOGY PROGRESS NOTE  Assessment/ Plan: Pt is a 59 y.o. yo male with history of hypertension, HLD, seizure disorder, ESRD on HD TTS presented after seizure, found to have potassium level of 7.7 required urgent dialysis.  HD orders: TTS at [facility] 4 hrs 0 min, 180NRe Optiflux, EDW 60 (kg), Dialysate 2.0 K, 2.25 Ca, Access: TC Calcitriol 0.25 PO TIW,Heparin 4000 bolus,S/p Venofer 100mg  x10  #Severe hyperkalemia: Missed a treatment last week.  Required urgent dialysis on 7/5 with improvement of potassium level.  Monitor lab.  # ESRD TTS: Plan for next dialysis on Thursday.  Electrolytes and volume status acceptable.  He has [facility] for the access.  # Anemia of ESRD: Hemoglobin at goal.  Monitor.  # Secondary hyperparathyroidism: Check phosphorus level.  # HTN/volume: Blood pressure and volume status acceptable.  Had 2.5 L fluid removal last night.  Monitor BP.  Continue current antihypertensives.  #Seizure disorder: CT head with no acute finding.  Per primary team.  Subjective: Seen and examined at bedside.  Reports feeling good.  Denies nausea, vomiting, chest pain, shortness of breath, headache, dizziness.  No further seizure attack. Objective Vital signs in last 24 hours: Vitals:   09/09/19 0508 09/09/19 0733 09/09/19 0744 09/09/19 0752  BP: (!) 133/99  116/71 (!) 130/91  Pulse:   80 77  Resp:   19 19  Temp: 98.3 F (36.8 C)  98.3 F (36.8 C) 97.9 F (36.6 C)  TempSrc: Oral  Oral Oral  SpO2:  93% 92% 91%  Weight:      Height:       Weight change:   Intake/Output Summary (Last 24 hours) at 09/09/2019 0918 Last data filed at 09/09/2019 0400 Gross per 24 hour  Intake --  Output 2500 ml  Net -2500 ml       Labs: Basic Metabolic Panel: Recent Labs  Lab 09/08/19 1951 09/08/19 2013 09/09/19 0615  NA 135 137 136  K 7.7* >7.5* 4.2  CL 104 99 98  CO2  --  18* 26  GLUCOSE 89 81 89  BUN 77* 84* 20  CREATININE 13.50* 12.25* 4.90*  CALCIUM   --  9.1 8.0*   Liver Function Tests: Recent Labs  Lab 09/08/19 2013 09/09/19 0615  AST 15 17  ALT 12 13  ALKPHOS 100 108  BILITOT 0.7 0.7  PROT 7.3 7.5  ALBUMIN 2.6* 2.5*   No results for input(s): LIPASE, AMYLASE in the last 168 hours. Recent Labs  Lab 09/08/19 0704  AMMONIA 36*   CBC: Recent Labs  Lab 09/08/19 1904 09/08/19 1951 09/09/19 0615  WBC 9.8  --  8.6  NEUTROABS 7.5  --  5.5  HGB 10.7* 11.2* 11.1*  HCT 35.3* 33.0* 34.7*  MCV 92.9  --  90.6  PLT 277  --  261   Cardiac Enzymes: No results for input(s): CKTOTAL, CKMB, CKMBINDEX, TROPONINI in the last 168 hours. CBG: Recent Labs  Lab 09/08/19 1947  GLUCAP 86    Iron Studies: No results for input(s): IRON, TIBC, TRANSFERRIN, FERRITIN in the last 72 hours. Studies/Results: CT HEAD WO CONTRAST  Result Date: 09/08/2019 CLINICAL DATA:  Recent seizure activity EXAM: CT HEAD WITHOUT CONTRAST TECHNIQUE: Contiguous axial images were obtained from the base of the skull through the vertex without intravenous contrast. COMPARISON:  08/09/2019 FINDINGS: Brain: No evidence of acute infarction, hemorrhage, hydrocephalus, extra-axial collection or mass lesion/mass effect. Mild chronic white matter ischemic changes are noted. Vascular: No hyperdense vessel or unexpected calcification.  Skull: Normal. Negative for fracture or focal lesion. Sinuses/Orbits: No acute finding. Other: None. IMPRESSION: No acute intracranial abnormality noted. Electronically Signed   By: Inez Catalina M.D.   On: 09/08/2019 19:34   DG Chest Port 1 View  Result Date: 09/08/2019 CLINICAL DATA:  Seizure. EXAM: PORTABLE CHEST 1 VIEW COMPARISON:  August 09, 2019 (7:31 a.m.) FINDINGS: The endotracheal tube and nasogastric tube seen on the prior study have been removed. A right internal jugular venous catheter is noted with its distal tip at the junction of the superior vena cava and right atrium. This represents a new finding. Mild, diffuse chronic appearing  increased lung markings are seen. There is no evidence of acute infiltrate, pleural effusion or pneumothorax. The heart size and mediastinal contours are within normal limits. Multiple chronic right-sided rib fractures are seen. IMPRESSION: Interval right internal jugular venous catheter placement positioning, as described above, without evidence of acute or active cardiopulmonary disease. Electronically Signed   By: Virgina Norfolk M.D.   On: 09/08/2019 18:31    Medications: Infusions: . magnesium sulfate bolus IVPB 2 g (09/09/19 0901)    Scheduled Medications: . amLODipine  10 mg Oral Daily  . atorvastatin  40 mg Oral Daily  . budesonide  0.5 mg Inhalation BID  . Chlorhexidine Gluconate Cloth  6 each Topical Q0600  . gabapentin  300 mg Oral Daily  . heparin  5,000 Units Subcutaneous Q8H  . hydrALAZINE  50 mg Oral BID  . losartan  50 mg Oral Daily  . metoprolol tartrate  25 mg Oral BID  . sodium bicarbonate  650 mg Oral BID  . tamsulosin  0.4 mg Oral QHS    have reviewed scheduled and prn medications.  Physical Exam: General:NAD, comfortable Heart:RRR, s1s2 nl Lungs: Some expiratory wheeze, no increased work of breathing. Abdomen:soft, Non-tender, non-distended Extremities:No edema Dialysis Access: TDC.  Campbell Agramonte Prasad Jaqwon Manfred 09/09/2019,9:18 AM  LOS: 0 days  Pager: [phone]

## 2019-09-10 ENCOUNTER — Inpatient Hospital Stay (HOSPITAL_COMMUNITY): Payer: Medicaid Other

## 2019-09-10 LAB — RENAL FUNCTION PANEL
Albumin: 2.4 g/dL — ABNORMAL LOW (ref 3.5–5.0)
Anion gap: 12 (ref 5–15)
BUN: 34 mg/dL — ABNORMAL HIGH (ref 8–23)
CO2: 26 mmol/L (ref 22–32)
Calcium: 8.3 mg/dL — ABNORMAL LOW (ref 8.9–10.3)
Chloride: 99 mmol/L (ref 98–111)
Creatinine, Ser: 7.09 mg/dL — ABNORMAL HIGH (ref 0.61–1.24)
GFR calc Af Amer: 9 mL/min — ABNORMAL LOW (ref >60)
GFR calc non Af Amer: 8 mL/min — ABNORMAL LOW (ref >60)
Glucose, Bld: 85 mg/dL (ref 70–99)
Phosphorus: 9.3 mg/dL — ABNORMAL HIGH (ref 2.5–4.6)
Potassium: 5.3 mmol/L — ABNORMAL HIGH (ref 3.5–5.1)
Sodium: 137 mmol/L (ref 135–145)

## 2019-09-10 LAB — URINE CULTURE: Culture: <10000 — AB

## 2019-09-10 LAB — T4, FREE: Free T4: 1.08 ng/dL (ref 0.61–1.12)

## 2019-09-10 LAB — MAGNESIUM: Magnesium: 2.4 mg/dL (ref 1.7–2.4)

## 2019-09-10 MED ORDER — CALCIUM ACETATE (PHOS BINDER) 667 MG PO CAPS
1334.0000 mg | ORAL_CAPSULE | Freq: Three times a day (TID) | ORAL | Status: DC
  Administered 2019-09-10 – 2019-09-17 (×17): 1334 mg via ORAL
  Filled 2019-09-10 (×17): qty 2

## 2019-09-10 MED ORDER — ALPRAZOLAM 0.5 MG PO TABS
0.5000 mg | ORAL_TABLET | Freq: Three times a day (TID) | ORAL | Status: DC | PRN
  Filled 2019-09-10: qty 1

## 2019-09-10 MED ORDER — CHLORHEXIDINE GLUCONATE CLOTH 2 % EX PADS
6.0000 | MEDICATED_PAD | Freq: Every day | CUTANEOUS | Status: DC
  Administered 2019-09-10 – 2019-09-12 (×3): 6 via TOPICAL

## 2019-09-10 MED ORDER — LEVOTHYROXINE SODIUM 50 MCG PO TABS
50.0000 ug | ORAL_TABLET | Freq: Every day | ORAL | Status: DC
  Administered 2019-09-11 – 2019-09-12 (×2): 50 ug via ORAL
  Filled 2019-09-10 (×3): qty 1

## 2019-09-10 MED ORDER — ALPRAZOLAM 0.5 MG PO TABS
0.5000 mg | ORAL_TABLET | Freq: Three times a day (TID) | ORAL | Status: DC | PRN
  Administered 2019-09-10 – 2019-09-11 (×3): 0.5 mg via ORAL
  Filled 2019-09-10 (×2): qty 1

## 2019-09-10 NOTE — Progress Notes (Signed)
PROGRESS NOTE    Ralph Becker  HDQ:[id number] DOB: [birth year] DOA: 09/08/2019 PCP: Patient, No Pcp Per   Brief Narrative:  Patient is a 59 year old male with history of COPD, end-stage renal disease on dialysis on TTS, hypertension, hyperlipidemia, nicotine dependence, hepatitis C, seizure disorder but not antiepileptics, chronic back pain who presented to the emergency department via EMS brought after suspected episode of seizure.  Patient was unable to contribute any information due to confusion.  He briefly was able to explain that he was sitting in the wheelchair when he experienced started feeling shaky.  As per ED notes, seizure lasted for 1 minute.  Upon further questioning, patient admitted that he missed several sessions of dialysis.  On presentation his potassium was 7.7.  Patient was given calcium gluconate, insulin, dextrose and Lokelma.  Nephrology consulted and he underwent emergent dialysis.  Neurology was also consulted regarding suspicion for seizure but not started on antiepileptics due to uremia.  Patient evaluated by PT/OT and recommended skilled facility on discharge.  He is medically stable for discharge soon as bed is available.  Assessment & Plan:   Principal Problem:   Hyperkalemia Active Problems:   Mixed hyperlipidemia   Acute metabolic encephalopathy   Hypertensive urgency   Seizure (HCC)   COPD (chronic obstructive pulmonary disease) (HCC)   Hyperkalemia: Presented with potassium of more than 7.  He was given calcium gluconate, insulin, dextrose, Lokelma.  Monitor on stepdown.  He underwent emergent dialysis.  Potassium level is 5.3 today.  We will continue to monitor potassium level.  ESRD on dialysis: Typically dialyzed on TTS is scheduled.  Nephrology following.  Emergent dialysis has been done here.  He said he missed a session of dialysis, he has history of noncompliance.  Nephrology following here for dialysis.  Plan for next dialysis tomorrow.  Acute  metabolic encephalopathy: Secondary to uremic encephalopathy.Monitor mental status.  CT head was unremarkable.  Normal folic acid, vitamin J19.  TSH is mildly elevated.  Will check free T4.  Mental status has improved.  Currently he is alert and awake but could not tell the correct month.  CT head did not show any acute intracranial abnormality.  Hypertensive urgency: Presented with severe hypertension most likely secondary to missing dialysis.  Improved after dialysis.  Started home regimen but he became hypotensive and head to be given a bolus of 500cc  on 09/09/2019 so home medications are on hold.  Seizure: As per the EMS report, he had 1 minute of seizure activity.  He had history of seizure disorder but was not in any kind of seizure medications.  Patient was hospitalized at [facility] in March for seizure but was not sent home on any medications.  That could have been precipitated also due to uremia.   Case discussed with neurology.  Since seizure could have been precipitated from uremia, AED not started.  EEG was consistent with toxic/metabolic encephalopathy, no definite epileptiform discharges.  Neurology following.  Hyperlipidemia: Continue statin  COPD: Mild wheezing was noted on presentation.  Continue bronchodilator therapy.  Currently on 2 L of oxygen per minute.  Not on oxygen at home.  Might need oxygen on discharge  Smoker: Smokes half pack a day.  Counseled for cessation.  Anxiety: Noted to be jittery,having jerking movements this morning.  Xanax restarted  Debility/deconditioning: Patient seen by PT/OT and recommended skilled nursing facility on discharge.  Social worker following.             DVT prophylaxis:Heparin  [place] Code Status: Full Family Communication: None present at the bed side Status VZ:[id number]   Dispo: The patient is from: Home              Anticipated d/c is to:SNF              Anticipated d/c date is: 2 days              Patient  currently is medically stable for discharge.     Consultants: Nephrology, neurology  Procedures:None  Antimicrobials:  Anti-infectives (From admission, onward)   None      Subjective: Patient seen and examined at the bedside this morning.  Hemodynamically stable.  He desaturates on room air and continues to require oxygen at 2 L/min.  Alert and oriented.  Blood pressure is better today.  He was requesting for Xanax for his anxiety.  Objective: Vitals:   09/09/19 1929 09/09/19 2255 09/10/19 0305 09/10/19 0722  BP:      Pulse:      Resp:      Temp:  97.7 F (36.5 C) 97.8 F (36.6 C)   TempSrc:  Oral Oral   SpO2: 97%   94%  Weight:      Height:        Intake/Output Summary (Last 24 hours) at 09/10/2019 0756 Last data filed at 09/10/2019 0300 Gross per 24 hour  Intake 360 ml  Output 1100 ml  Net -740 ml   Filed Weights   09/08/19 1853 09/09/19 0400  Weight: 74.8 kg 74.8 kg    Examination:   General exam: Appears calm and comfortable ,anxious  Respiratory system: Decreased air entry bilaterally, mild expiratory wheezes, some rhonchi and some crackles  cardiovascular system: S1 & S2 heard, RRR. No JVD, murmurs, rubs, gallops or clicks.  Temporary dialysis catheter on the right chest Gastrointestinal system: Abdomen is nondistended, soft and nontender. No organomegaly or masses felt. Normal bowel sounds heard. Central nervous system: Alert and oriented. No focal neurological deficits. Extremities: No edema, no clubbing ,no cyanosis, distal peripheral pulses palpable. Skin: No rashes, lesions or ulcers,no icterus ,no pallor    Data Reviewed: I have personally reviewed following labs and imaging studies  CBC: Recent Labs  Lab 09/08/19 1904 09/08/19 1951 09/09/19 0615  WBC 9.8  --  8.6  NEUTROABS 7.5  --  5.5  HGB 10.7* 11.2* 11.1*  HCT 35.3* 33.0* 34.7*  MCV 92.9  --  90.6  PLT 277  --  412   Basic Metabolic Panel: Recent Labs  Lab 09/08/19 1951  09/08/19 2013 09/09/19 0615 09/10/19 0403  NA 135 137 136 137  K 7.7* >7.5* 4.2 5.3*  CL 104 99 98 99  CO2  --  18* 26 26  GLUCOSE 89 81 89 85  BUN 77* 84* 20 34*  CREATININE 13.50* 12.25* 4.90* 7.09*  CALCIUM  --  9.1 8.0* 8.3*  MG  --   --  1.6* 2.4  PHOS  --   --   --  9.3*   GFR: Estimated Creatinine Clearance: 11.6 mL/min (A) (by C-G formula based on SCr of 7.09 mg/dL (H)). Liver Function Tests: Recent Labs  Lab 09/08/19 2013 09/09/19 0615 09/10/19 0403  AST 15 17  --   ALT 12 13  --   ALKPHOS 100 108  --   BILITOT 0.7 0.7  --   PROT 7.3 7.5  --   ALBUMIN 2.6* 2.5* 2.4*   No results for input(s): LIPASE, AMYLASE in  the last 168 hours. Recent Labs  Lab 09/08/19 0704  AMMONIA 36*   Coagulation Profile: No results for input(s): INR, PROTIME in the last 168 hours. Cardiac Enzymes: No results for input(s): CKTOTAL, CKMB, CKMBINDEX, TROPONINI in the last 168 hours. BNP (last 3 results) No results for input(s): PROBNP in the last 8760 hours. HbA1C: No results for input(s): HGBA1C in the last 72 hours. CBG: Recent Labs  Lab 09/08/19 1947  GLUCAP 86   Lipid Profile: No results for input(s): CHOL, HDL, LDLCALC, TRIG, CHOLHDL, LDLDIRECT in the last 72 hours. Thyroid Function Tests: Recent Labs    09/09/19 0615  TSH 8.564*   Anemia Panel: Recent Labs    09/09/19 0615  VITAMINB12 799  FOLATE 41.4   Sepsis Labs: Recent Labs  Lab 09/08/19 1945  LATICACIDVEN 1.1    Recent Results (from the past 240 hour(s))  SARS Coronavirus 2 by RT PCR (hospital order, performed in [facility] hospital lab) Nasopharyngeal Nasopharyngeal Swab     Status: None   Collection Time: 09/08/19 10:11 PM   Specimen: Nasopharyngeal Swab  Result Value Ref Range Status   SARS Coronavirus 2 NEGATIVE NEGATIVE Final    Comment: (NOTE) SARS-CoV-2 target nucleic acids are NOT DETECTED.  The SARS-CoV-2 RNA is generally detectable in upper and lower respiratory specimens during the  acute phase of infection. The lowest concentration of SARS-CoV-2 viral copies this assay can detect is 250 copies / mL. A negative result does not preclude SARS-CoV-2 infection and should not be used as the sole basis for treatment or other patient management decisions.  A negative result may occur with improper specimen collection / handling, submission of specimen other than nasopharyngeal swab, presence of viral mutation(s) within the areas targeted by this assay, and inadequate number of viral copies (<250 copies / mL). A negative result must be combined with clinical observations, patient history, and epidemiological information.  Fact Sheet for Patients:   StrictlyIdeas.no  Fact Sheet for Healthcare Providers: BankingDealers.co.za  This test is not yet approved or  cleared by the [place] FDA and has been authorized for detection and/or diagnosis of SARS-CoV-2 by FDA under an Emergency Use Authorization (EUA).  This EUA will remain in effect (meaning this test can be used) for the duration of the COVID-19 declaration under Section 564(b)(1) of the Act, 21 U.S.C. section 360bbb-3(b)(1), unless the authorization is terminated or revoked sooner.  Performed at [facility], [place] [address], [place], [place] [address]          Radiology Studies: EEG  Result Date: 09/09/2019 Greta Doom, MD     09/09/2019 10:47 AM History: 59 year old male who missed some dialysis and presented with a BUN of 100 and encephalopathy.  In the setting, he had seizure-like activity. Sedation: None Technique: This is a 21 channel routine scalp EEG performed at the bedside with bipolar and monopolar montages arranged in accordance to the international 10/20 system of electrode placement. One channel was dedicated to EKG recording. Background: There is a posterior dominant rhythm of 7 - 8Hz , however the background is dominated by generalized  irregular delta and theta range slow activity.  There is also intermittent frontally predominant rhythmic delta range activity(FIRDA) without evolution.  There are frequent triphasic morphology discharges with bifrontal predominance.  There is excessive beta which at times does give a sharply contoured appearance to the background as well.  He has frequent myoclonus which is associated with muscle artifact on EEG. Photic stimulation: Physiologic driving  is not performed EEG Abnormalities: 1) triphasic waves 2) frontal intermittent rhythmic delta activity (FIRDA) 3) generalized irregular slow activity 4) slow PDR Clinical Interpretation: This EEG is most consistent with a toxic/metabolic encephalopathy.  The triphasic discharges are very sharply contoured at times, and these can represent cortical irritability, but I do not feel there are any definite epileptiform discharges.  If high concern for epilepsy remains, could consider repeating the study after further dialysis/correction of metabolic derangements. Roland Rack, MD [facility] [phone] If 7pm- 7am, please page neurology on call as listed in [place].   CT HEAD WO CONTRAST  Result Date: 09/08/2019 CLINICAL DATA:  Recent seizure activity EXAM: CT HEAD WITHOUT CONTRAST TECHNIQUE: Contiguous axial images were obtained from the base of the skull through the vertex without intravenous contrast. COMPARISON:  08/09/2019 FINDINGS: Brain: No evidence of acute infarction, hemorrhage, hydrocephalus, extra-axial collection or mass lesion/mass effect. Mild chronic white matter ischemic changes are noted. Vascular: No hyperdense vessel or unexpected calcification. Skull: Normal. Negative for fracture or focal lesion. Sinuses/Orbits: No acute finding. Other: None. IMPRESSION: No acute intracranial abnormality noted. Electronically Signed   By: Inez Catalina M.D.   On: 09/08/2019 19:34   DG Chest Port 1 View  Result Date: 09/08/2019 CLINICAL DATA:   Seizure. EXAM: PORTABLE CHEST 1 VIEW COMPARISON:  August 09, 2019 (7:31 a.m.) FINDINGS: The endotracheal tube and nasogastric tube seen on the prior study have been removed. A right internal jugular venous catheter is noted with its distal tip at the junction of the superior vena cava and right atrium. This represents a new finding. Mild, diffuse chronic appearing increased lung markings are seen. There is no evidence of acute infiltrate, pleural effusion or pneumothorax. The heart size and mediastinal contours are within normal limits. Multiple chronic right-sided rib fractures are seen. IMPRESSION: Interval right internal jugular venous catheter placement positioning, as described above, without evidence of acute or active cardiopulmonary disease. Electronically Signed   By: Virgina Norfolk M.D.   On: 09/08/2019 18:31        Scheduled Meds: . atorvastatin  40 mg Oral Daily  . budesonide  0.5 mg Inhalation BID  . calcitRIOL  0.25 mcg Oral Q T,Th,Sat-1800  . Chlorhexidine Gluconate Cloth  6 each Topical Q0600  . gabapentin  300 mg Oral Daily  . heparin  5,000 Units Subcutaneous Q8H   Continuous Infusions:   LOS: 1 day    Time spent: 35 mins,More than 50% of that time was spent in counseling and/or coordination of care.      Shelly Coss, MD [facility] P7/09/2019, 7:56 AM

## 2019-09-10 NOTE — Progress Notes (Signed)
[facility] NEPHROLOGY PROGRESS NOTE  Assessment/ Plan: Pt is a 59 y.o. yo male with history of hypertension, HLD, seizure disorder, ESRD on HD TTS presented after seizure, found to have potassium level of 7.7 required urgent dialysis.  HD orders: TTS at [place] 4 hrs 0 min, 180NRe Optiflux, EDW 60 (kg), Dialysate 2.0 K, 2.25 Ca, Access: TC Calcitriol 0.25 PO TIW,Heparin 4000 bolus,S/p Venofer 100mg  x10  #Severe hyperkalemia: Due to missed dialysis treatment.  Required urgent dialysis on 7/5 with improvement of potassium level.  Monitor lab.  # ESRD TTS: Plan for next dialysis tomorrow.  No fluid overload on exam.  Chest x-ray with no active disease.  He has [facility] for the access.  #Shortness of breath/hypoxia: Uses tobacco.  No edema and chest x-ray unremarkable.  Bronchodilators and inhalers per primary team.  May need oxygen on discharge.  # Anemia of ESRD: Hemoglobin at goal.  Monitor.  # Secondary hyperparathyroidism: Phosphorus level very high.  Start calcium acetate.  # HTN/volume: Blood pressure and volume status acceptable.  Continue current antihypertensives.  Monitor BP.  #Seizure disorder: CT head with no acute finding.  Per primary team.  Subjective: Seen and examined at bedside.  Reports feeling good without any complaint.  Denies nausea vomiting chest pain shortness of breath. Objective Vital signs in last 24 hours: Vitals:   09/10/19 0305 09/10/19 0722 09/10/19 0821 09/10/19 0824  BP:   130/90   Pulse:   71 68  Resp:   17 (!) 24  Temp: 97.8 F (36.6 C)   97.8 F (36.6 C)  TempSrc: Oral   Oral  SpO2:  94% 92% (!) 89%  Weight:      Height:       Weight change:   Intake/Output Summary (Last 24 hours) at 09/10/2019 0943 Last data filed at 09/10/2019 0300 Gross per 24 hour  Intake 240 ml  Output 750 ml  Net -510 ml       Labs: Basic Metabolic Panel: Recent Labs  Lab 09/08/19 2013 09/09/19 0615 09/10/19 0403  NA 137 136 137  K >7.5* 4.2  5.3*  CL 99 98 99  CO2 18* 26 26  GLUCOSE 81 89 85  BUN 84* 20 34*  CREATININE 12.25* 4.90* 7.09*  CALCIUM 9.1 8.0* 8.3*  PHOS  --   --  9.3*   Liver Function Tests: Recent Labs  Lab 09/08/19 2013 09/09/19 0615 09/10/19 0403  AST 15 17  --   ALT 12 13  --   ALKPHOS 100 108  --   BILITOT 0.7 0.7  --   PROT 7.3 7.5  --   ALBUMIN 2.6* 2.5* 2.4*   No results for input(s): LIPASE, AMYLASE in the last 168 hours. Recent Labs  Lab 09/08/19 0704  AMMONIA 36*   CBC: Recent Labs  Lab 09/08/19 1904 09/08/19 1951 09/09/19 0615  WBC 9.8  --  8.6  NEUTROABS 7.5  --  5.5  HGB 10.7* 11.2* 11.1*  HCT 35.3* 33.0* 34.7*  MCV 92.9  --  90.6  PLT 277  --  261   Cardiac Enzymes: No results for input(s): CKTOTAL, CKMB, CKMBINDEX, TROPONINI in the last 168 hours. CBG: Recent Labs  Lab 09/08/19 1947  GLUCAP 86    Iron Studies: No results for input(s): IRON, TIBC, TRANSFERRIN, FERRITIN in the last 72 hours. Studies/Results: EEG  Result Date: 09/09/2019 Greta Doom, MD     09/09/2019 10:47 AM History: 59 year old male who missed some dialysis and  presented with a BUN of 100 and encephalopathy.  In the setting, he had seizure-like activity. Sedation: None Technique: This is a 21 channel routine scalp EEG performed at the bedside with bipolar and monopolar montages arranged in accordance to the international 10/20 system of electrode placement. One channel was dedicated to EKG recording. Background: There is a posterior dominant rhythm of 7 - 8Hz , however the background is dominated by generalized irregular delta and theta range slow activity.  There is also intermittent frontally predominant rhythmic delta range activity(FIRDA) without evolution.  There are frequent triphasic morphology discharges with bifrontal predominance.  There is excessive beta which at times does give a sharply contoured appearance to the background as well.  He has frequent myoclonus which is associated with  muscle artifact on EEG. Photic stimulation: Physiologic driving is not performed EEG Abnormalities: 1) triphasic waves 2) frontal intermittent rhythmic delta activity (FIRDA) 3) generalized irregular slow activity 4) slow PDR Clinical Interpretation: This EEG is most consistent with a toxic/metabolic encephalopathy.  The triphasic discharges are very sharply contoured at times, and these can represent cortical irritability, but I do not feel there are any definite epileptiform discharges.  If high concern for epilepsy remains, could consider repeating the study after further dialysis/correction of metabolic derangements. Roland Rack, MD [facility] [phone] If 7pm- 7am, please page neurology on call as listed in [place].   CT HEAD WO CONTRAST  Result Date: 09/08/2019 CLINICAL DATA:  Recent seizure activity EXAM: CT HEAD WITHOUT CONTRAST TECHNIQUE: Contiguous axial images were obtained from the base of the skull through the vertex without intravenous contrast. COMPARISON:  08/09/2019 FINDINGS: Brain: No evidence of acute infarction, hemorrhage, hydrocephalus, extra-axial collection or mass lesion/mass effect. Mild chronic white matter ischemic changes are noted. Vascular: No hyperdense vessel or unexpected calcification. Skull: Normal. Negative for fracture or focal lesion. Sinuses/Orbits: No acute finding. Other: None. IMPRESSION: No acute intracranial abnormality noted. Electronically Signed   By: Inez Catalina M.D.   On: 09/08/2019 19:34   DG CHEST PORT 1 VIEW  Result Date: 09/10/2019 CLINICAL DATA:  Shortness of breath COPD EXAM: PORTABLE CHEST 1 VIEW COMPARISON:  09/08/2019 FINDINGS: RIGHT-sided dual lumen dialysis catheter in place. Cardiomediastinal contours and hilar structures are stable with catheter terminating at the caval to atrial junction/upper RIGHT atrium with unchanged appearance. Lungs are clear. No sign of effusion. Thoracic spinal fusion as before. Limited assessment of  skeletal structures otherwise without acute process. IMPRESSION: No active disease. Electronically Signed   By: Zetta Bills M.D.   On: 09/10/2019 08:54   DG Chest Port 1 View  Result Date: 09/08/2019 CLINICAL DATA:  Seizure. EXAM: PORTABLE CHEST 1 VIEW COMPARISON:  August 09, 2019 (7:31 a.m.) FINDINGS: The endotracheal tube and nasogastric tube seen on the prior study have been removed. A right internal jugular venous catheter is noted with its distal tip at the junction of the superior vena cava and right atrium. This represents a new finding. Mild, diffuse chronic appearing increased lung markings are seen. There is no evidence of acute infiltrate, pleural effusion or pneumothorax. The heart size and mediastinal contours are within normal limits. Multiple chronic right-sided rib fractures are seen. IMPRESSION: Interval right internal jugular venous catheter placement positioning, as described above, without evidence of acute or active cardiopulmonary disease. Electronically Signed   By: Virgina Norfolk M.D.   On: 09/08/2019 18:31    Medications: Infusions:   Scheduled Medications: . atorvastatin  40 mg Oral Daily  . budesonide  0.5 mg  Inhalation BID  . calcitRIOL  0.25 mcg Oral Q T,Th,Sat-1800  . Chlorhexidine Gluconate Cloth  6 each Topical Q0600  . gabapentin  300 mg Oral Daily  . heparin  5,000 Units Subcutaneous Q8H    have reviewed scheduled and prn medications.  Physical Exam: General:NAD, lying on bed comfortable Heart:RRR, s1s2 nl Lungs: Some expiratory wheeze, no increased work of breathing. Abdomen:soft, Non-tender, non-distended Extremities:No edema Dialysis Access: TDC.  Safiyya Stokes Prasad Nayden Czajka 09/10/2019,9:43 AM  LOS: 1 day  Pager: [phone]

## 2019-09-11 LAB — CBC
HCT: 33.9 % — ABNORMAL LOW (ref 39.0–52.0)
Hemoglobin: 10.2 g/dL — ABNORMAL LOW (ref 13.0–17.0)
MCH: 28.1 pg (ref 26.0–34.0)
MCHC: 30.1 g/dL (ref 30.0–36.0)
MCV: 93.4 fL (ref 80.0–100.0)
Platelets: 214 10*3/uL (ref 150–400)
RBC: 3.63 MIL/uL — ABNORMAL LOW (ref 4.22–5.81)
RDW: 17.6 % — ABNORMAL HIGH (ref 11.5–15.5)
WBC: 8.8 10*3/uL (ref 4.0–10.5)
nRBC: 0 % (ref 0.0–0.2)

## 2019-09-11 LAB — RENAL FUNCTION PANEL
Albumin: 2.1 g/dL — ABNORMAL LOW (ref 3.5–5.0)
Anion gap: 13 (ref 5–15)
BUN: 47 mg/dL — ABNORMAL HIGH (ref 8–23)
CO2: 27 mmol/L (ref 22–32)
Calcium: 8.6 mg/dL — ABNORMAL LOW (ref 8.9–10.3)
Chloride: 97 mmol/L — ABNORMAL LOW (ref 98–111)
Creatinine, Ser: 8.93 mg/dL — ABNORMAL HIGH (ref 0.61–1.24)
GFR calc Af Amer: 7 mL/min — ABNORMAL LOW (ref >60)
GFR calc non Af Amer: 6 mL/min — ABNORMAL LOW (ref >60)
Glucose, Bld: 81 mg/dL (ref 70–99)
Phosphorus: 11.2 mg/dL — ABNORMAL HIGH (ref 2.5–4.6)
Potassium: 5.5 mmol/L — ABNORMAL HIGH (ref 3.5–5.1)
Sodium: 137 mmol/L (ref 135–145)

## 2019-09-11 MED ORDER — HEPARIN SODIUM (PORCINE) 1000 UNIT/ML IJ SOLN
INTRAMUSCULAR | Status: AC
  Administered 2019-09-11: 3600 [IU]
  Filled 2019-09-11: qty 4

## 2019-09-11 MED ORDER — PREDNISONE 20 MG PO TABS
40.0000 mg | ORAL_TABLET | Freq: Every day | ORAL | Status: AC
  Administered 2019-09-11 – 2019-09-15 (×4): 40 mg via ORAL
  Filled 2019-09-11 (×4): qty 2

## 2019-09-11 MED ORDER — CLONAZEPAM 1 MG PO TABS: 1.0000 mg | ORAL_TABLET | Freq: Every day | ORAL | Status: DC

## 2019-09-11 MED ORDER — ALPRAZOLAM 0.5 MG PO TABS
1.0000 mg | ORAL_TABLET | Freq: Three times a day (TID) | ORAL | Status: DC | PRN
  Administered 2019-09-11 – 2019-09-12 (×3): 1 mg via ORAL
  Filled 2019-09-11 (×3): qty 2

## 2019-09-11 NOTE — Progress Notes (Signed)
PT Cancellation Note  Patient Details Name: Ralph Becker MRN: [id number] DOB: [birth year]   Cancelled Treatment:    Reason Eval/Treat Not Completed: Patient at procedure or test/unavailable - going to HD, will check back as schedule allows.  [place] Pager [phone]  Office [phone]    Roxine Caddy D Elonda Husky 09/11/2019, 11:38 AM

## 2019-09-11 NOTE — Progress Notes (Signed)
PROGRESS NOTE    Ralph Becker  XLK:[id number] DOB: 1960-06-01 DOA: 09/08/2019 PCP: Patient, No Pcp Per   Brief Narrative:  Patient is a 59 year old male with history of COPD, end-stage renal disease on dialysis on TTS, hypertension, hyperlipidemia, nicotine dependence, hepatitis C, seizure disorder but not antiepileptics, chronic back pain who presented to the emergency department via EMS brought after suspected episode of seizure.  Patient was unable to contribute any information due to confusion.  He briefly was able to explain that he was sitting in the wheelchair when he experienced started feeling shaky.  As per ED notes, seizure lasted for 1 minute.  Upon further questioning, patient admitted that he missed several sessions of dialysis.  On presentation his potassium was 7.7.  Patient was given calcium gluconate, insulin, dextrose and Lokelma.  Nephrology consulted and he underwent emergent dialysis.  Neurology was also consulted regarding suspicion for seizure but not started on antiepileptics due to uremia.  Patient evaluated by PT/OT and recommended skilled facility on discharge.  He is medically stable for discharge soon as bed is available.  Assessment & Plan:   Principal Problem:   Hyperkalemia Active Problems:   Mixed hyperlipidemia   Acute metabolic encephalopathy   Hypertensive urgency   Seizure (HCC)   COPD (chronic obstructive pulmonary disease) (HCC)   Hyperkalemia: Presented with potassium of more than 7.  He was given calcium gluconate, insulin, dextrose, Lokelma.  Monitor on stepdown.  He underwent emergent dialysis.  Potassium level is 5.5 today.  We will continue to monitor potassium level.  ESRD on dialysis: Typically dialyzed on TTS is scheduled.  Nephrology following.  Emergent dialysis has been done here.  He said he missed a session of dialysis, he has history of noncompliance.  Nephrology following here for dialysis.  Patient is being dialyzed today.  Acute  metabolic encephalopathy: Secondary to uremic encephalopathy.Monitor mental status.  CT head was unremarkable.  Normal folic acid, vitamin D66.  TSH is mildly elevated.  Will check free T4.  Mental status has improved.  Currently he is alert and awake but could not tell the correct month.  CT head did not show any acute intracranial abnormality.  Hypertensive urgency: Presented with severe hypertension most likely secondary to missing dialysis.  Improved after dialysis.  Started home regimen but he became hypotensive and head to be given a bolus of 500cc  on 09/09/2019 so home medications are on hold.  Seizure: As per the EMS report, he had 1 minute of seizure activity.  He had history of seizure disorder but was not in any kind of seizure medications.  Patient was hospitalized at [facility] in March for seizure but was not sent home on any medications.  That could have been precipitated also due to uremia.   Case discussed with neurology.  Since seizure could have been precipitated from uremia, AED not started.  EEG was consistent with toxic/metabolic encephalopathy, no definite epileptiform discharges.    Hyperlipidemia: Continue statin  COPD: Mild wheezing was noted on presentation.  Continue bronchodilator therapy.  Currently on 2 L of oxygen per minute.  Not on oxygen at home.  Might need oxygen on discharge.started on prednisone  Smoker: Smokes half pack a day.  Counseled for cessation.  Anxiety: Noted to be jittery,having jerking movements,anxious .  On xanax,dose increased  Debility/deconditioning: Patient seen by PT/OT and recommended skilled nursing facility on discharge.  Social worker following.            DVT prophylaxis:Heparin  [place] Code Status: Full Family Communication: None present at the bed side Status OM:VEHMCNOBS   Dispo: The patient is from: Home              Anticipated d/c is to:SNF              Anticipated d/c date is: On the day of bed availability               Patient currently is medically stable for discharge.     Consultants: Nephrology, neurology  Procedures:None  Antimicrobials:  Anti-infectives (From admission, onward)   None      Subjective: Patient seen and examined at the bedside this morning.  Hemodynamically stable.  Comfortable.  Noted to have persistent jittery movement, jerking movement suspected to be from anxiety.  Mild wheezes noted on auscultation.  Objective: Vitals:   09/11/19 0017 09/11/19 0348 09/11/19 0725 09/11/19 0729  BP: (!) 126/91 105/75    Pulse: 64 62  (!) 58  Resp: 20 20  17   Temp: 98 F (36.7 C) 98.2 F (36.8 C) 98 F (36.7 C)   TempSrc: Oral Oral Oral   SpO2: 97% 98%  99%  Weight:      Height:        Intake/Output Summary (Last 24 hours) at 09/11/2019 0741 Last data filed at 09/10/2019 1700 Gross per 24 hour  Intake 600 ml  Output 500 ml  Net 100 ml   Filed Weights   09/08/19 1853 09/09/19 0400  Weight: 74.8 kg 74.8 kg    Examination:  General exam: Very deconditioned, debilitated, thin built Respiratory system: Mild expiratory wheezes  cardiovascular system: S1 & S2 heard, RRR. No JVD, murmurs, rubs, gallops or clicks. Gastrointestinal system: Abdomen is nondistended, soft and nontender. No organomegaly or masses felt. Normal bowel sounds heard. Central nervous system: Alert and oriented. Extremities: No edema, no clubbing ,no cyanosis Skin: No rashes, lesions or ulcers,no icterus ,no pallor  Data Reviewed: I have personally reviewed following labs and imaging studies  CBC: Recent Labs  Lab 09/08/19 1904 09/08/19 1951 09/09/19 0615  WBC 9.8  --  8.6  NEUTROABS 7.5  --  5.5  HGB 10.7* 11.2* 11.1*  HCT 35.3* 33.0* 34.7*  MCV 92.9  --  90.6  PLT 277  --  962   Basic Metabolic Panel: Recent Labs  Lab 09/08/19 1951 09/08/19 2013 09/09/19 0615 09/10/19 0403 09/11/19 0242  NA 135 137 136 137 137  K 7.7* >7.5* 4.2 5.3* 5.5*  CL 104 99 98 99 97*  CO2  --  18*  26 26 27   GLUCOSE 89 81 89 85 81  BUN 77* 84* 20 34* 47*  CREATININE 13.50* 12.25* 4.90* 7.09* 8.93*  CALCIUM  --  9.1 8.0* 8.3* 8.6*  MG  --   --  1.6* 2.4  --   PHOS  --   --   --  9.3* 11.2*   GFR: Estimated Creatinine Clearance: 9.2 mL/min (A) (by C-G formula based on SCr of 8.93 mg/dL (H)). Liver Function Tests: Recent Labs  Lab 09/08/19 2013 09/09/19 0615 09/10/19 0403 09/11/19 0242  AST 15 17  --   --   ALT 12 13  --   --   ALKPHOS 100 108  --   --   BILITOT 0.7 0.7  --   --   PROT 7.3 7.5  --   --   ALBUMIN 2.6* 2.5* 2.4* 2.1*   No results for input(s): LIPASE, AMYLASE  in the last 168 hours. Recent Labs  Lab 09/08/19 0704  AMMONIA 36*   Coagulation Profile: No results for input(s): INR, PROTIME in the last 168 hours. Cardiac Enzymes: No results for input(s): CKTOTAL, CKMB, CKMBINDEX, TROPONINI in the last 168 hours. BNP (last 3 results) No results for input(s): PROBNP in the last 8760 hours. HbA1C: No results for input(s): HGBA1C in the last 72 hours. CBG: Recent Labs  Lab 09/08/19 1947  GLUCAP 86   Lipid Profile: No results for input(s): CHOL, HDL, LDLCALC, TRIG, CHOLHDL, LDLDIRECT in the last 72 hours. Thyroid Function Tests: Recent Labs    09/09/19 0615  TSH 8.564*  FREET4 1.08   Anemia Panel: Recent Labs    09/09/19 0615  VITAMINB12 799  FOLATE 41.4   Sepsis Labs: Recent Labs  Lab 09/08/19 1945  LATICACIDVEN 1.1    Recent Results (from the past 240 hour(s))  SARS Coronavirus 2 by RT PCR (hospital order, performed in [facility] hospital lab) Nasopharyngeal Nasopharyngeal Swab     Status: None   Collection Time: 09/08/19 10:11 PM   Specimen: Nasopharyngeal Swab  Result Value Ref Range Status   SARS Coronavirus 2 NEGATIVE NEGATIVE Final    Comment: (NOTE) SARS-CoV-2 target nucleic acids are NOT DETECTED.  The SARS-CoV-2 RNA is generally detectable in upper and lower respiratory specimens during the acute phase of infection. The  lowest concentration of SARS-CoV-2 viral copies this assay can detect is 250 copies / mL. A negative result does not preclude SARS-CoV-2 infection and should not be used as the sole basis for treatment or other patient management decisions.  A negative result may occur with improper specimen collection / handling, submission of specimen other than nasopharyngeal swab, presence of viral mutation(s) within the areas targeted by this assay, and inadequate number of viral copies (<250 copies / mL). A negative result must be combined with clinical observations, patient history, and epidemiological information.  Fact Sheet for Patients:   StrictlyIdeas.no  Fact Sheet for Healthcare Providers: BankingDealers.co.za  This test is not yet approved or  cleared by the [place] FDA and has been authorized for detection and/or diagnosis of SARS-CoV-2 by FDA under an Emergency Use Authorization (EUA).  This EUA will remain in effect (meaning this test can be used) for the duration of the COVID-19 declaration under Section 564(b)(1) of the Act, 21 U.S.C. section 360bbb-3(b)(1), unless the authorization is terminated or revoked sooner.  Performed at [facility], [place] [address] [place], [place] [address]   Urine culture     Status: Abnormal   Collection Time: 09/08/19 10:22 PM   Specimen: Urine, Random  Result Value Ref Range Status   Specimen Description URINE, RANDOM  Final   Special Requests NONE  Final   Culture (A)  Final    <10,000 COLONIES/mL INSIGNIFICANT GROWTH Performed at [facility], [place] [address] [place], [place] [address]    Report Status 09/10/2019 FINAL  Final         Radiology Studies: EEG  Result Date: 09/09/2019 Greta Doom, MD     09/09/2019 10:47 AM History: 59 year old male who missed some dialysis and presented with a BUN of 100 and encephalopathy.  In the setting, he had seizure-like  activity. Sedation: None Technique: This is a 21 channel routine scalp EEG performed at the bedside with bipolar and monopolar montages arranged in accordance to the international 10/20 system of electrode placement. One channel was dedicated to EKG recording. Background: There is  a posterior dominant rhythm of 7 - 8Hz , however the background is dominated by generalized irregular delta and theta range slow activity.  There is also intermittent frontally predominant rhythmic delta range activity(FIRDA) without evolution.  There are frequent triphasic morphology discharges with bifrontal predominance.  There is excessive beta which at times does give a sharply contoured appearance to the background as well.  He has frequent myoclonus which is associated with muscle artifact on EEG. Photic stimulation: Physiologic driving is not performed EEG Abnormalities: 1) triphasic waves 2) frontal intermittent rhythmic delta activity (FIRDA) 3) generalized irregular slow activity 4) slow PDR Clinical Interpretation: This EEG is most consistent with a toxic/metabolic encephalopathy.  The triphasic discharges are very sharply contoured at times, and these can represent cortical irritability, but I do not feel there are any definite epileptiform discharges.  If high concern for epilepsy remains, could consider repeating the study after further dialysis/correction of metabolic derangements. [place] Rack, MD [facility] [phone] If 7pm- 7am, please page neurology on call as listed in [place].   DG CHEST PORT 1 VIEW  Result Date: 09/10/2019 CLINICAL DATA:  Shortness of breath COPD EXAM: PORTABLE CHEST 1 VIEW COMPARISON:  09/08/2019 FINDINGS: RIGHT-sided dual lumen dialysis catheter in place. Cardiomediastinal contours and hilar structures are stable with catheter terminating at the caval to atrial junction/upper RIGHT atrium with unchanged appearance. Lungs are clear. No sign of effusion. Thoracic spinal fusion  as before. Limited assessment of skeletal structures otherwise without acute process. IMPRESSION: No active disease. Electronically Signed   By: Zetta Bills M.D.   On: 09/10/2019 08:54        Scheduled Meds: . atorvastatin  40 mg Oral Daily  . budesonide  0.5 mg Inhalation BID  . calcitRIOL  0.25 mcg Oral Q T,Th,Sat-1800  . calcium acetate  1,334 mg Oral TID WC  . Chlorhexidine Gluconate Cloth  6 each Topical Q0600  . Chlorhexidine Gluconate Cloth  6 each Topical Q0600  . gabapentin  300 mg Oral Daily  . heparin  5,000 Units Subcutaneous Q8H  . levothyroxine  50 mcg Oral QAC breakfast   Continuous Infusions:   LOS: 2 days    Time spent: 35 mins,More than 50% of that time was spent in counseling and/or coordination of care.      Shelly Coss, MD [facility] P7/10/2019, 7:41 AM

## 2019-09-11 NOTE — Progress Notes (Signed)
The patient has improved.  He did run out of his benzodiazepines before this current seizure.  He is awake, alert, tremulousness/myoclonus is greatly improved.  At this point, no recommendation for antiepileptics unless he were to have further unprovoked seizure that was not in the setting of an acute physiological stressor and benzo withdrawal.  He does not have a history of a seizure disorder, but had a previous benzo withdrawal seizure, but with question of this in the past, I would institute driving probations for 6 months.  Neurology will be available as needed moving forward, please call with further questions or concerns.  [place] Rack, MD [facility] [phone]  If 7pm- 7am, please page neurology on call as listed in [place].

## 2019-09-11 NOTE — Progress Notes (Signed)
[facility]PH[facility]OLOG[facility] PROGRESS NOTE  Assessment/ Plan: Pt is a 59 y.o. yo male with history of hypertension, HLD, seizure disorder, ESRD on HD TTS presented after seizure, found to have potassium level of 7.7 required urgent dialysis.  HD orders: TTS at  4 hrs 0 min, 180NRe Optiflux, EDW 60 (kg), Dialysate 2.0 K, 2.25 Ca, Access: TC Calcitriol 0.25 PO TIW,Heparin 4000 bolus,S/p Venofer 100mg  x10  #Severe hyperkalemia: Due to missed dialysis treatment.  Required urgent dialysis on 7/5 with improvement of potassium level.  Monitor lab.  # ESRD TTS: Plan for dialysis today per his schedule.  He has good urine output and volume status looks acceptable.  X-ray with no pulmonary congestion.  He did not feel well after dialysis last time and had a cramp.  I will lower goal to only 1 to 1.5 kg. He has [facility] for the access.  #Shortness of breath/hypoxia: Uses tobacco.  No edema and chest x-ray unremarkable.  Bronchodilators and inhalers per primary team.  May need oxygen on discharge.  # Anemia of ESRD: Hemoglobin at goal.  Monitor.  # Secondary hyperparathyroidism: Phosphorus level very high.  Started calcium acetate.  # HTN/volume: Blood pressure and volume status acceptable.  Continue current antihypertensives.  Monitor BP.  #Seizure disorder: CT head with no acute finding.  Per primary team.  Subjective: Seen and examined at bedside.  Reports that he did not feel well after dialysis last time.  He had about a liter of urine output today.  Denies nausea vomiting chest pain, shortness of breath.    Objective Vital signs in last 24 hours: Vitals:   09/11/19 0017 09/11/19 0348 09/11/19 0725 09/11/19 0729  BP: (!) 126/91 105/75    Pulse: 64 62  (!) 58  Resp: 20 20  17   Temp: 98 F (36.7 C) 98.2 F (36.8 C) 98 F (36.7 C)   TempSrc: Oral Oral Oral   SpO2: 97% 98%  99%  Weight:      Height:       Weight change:   Intake/Output Summary (Last 24 hours) at 09/11/2019  1029 Last data filed at 09/11/2019 1022 Gross per 24 hour  Intake 360 ml  Output 1100 ml  Net -740 ml       Labs: Basic Metabolic Panel: Recent Labs  Lab 09/09/19 0615 09/10/19 0403 09/11/19 0242  NA 136 137 137  K 4.2 5.3* 5.5*  CL 98 99 97*  CO2 26 26 27   GLUCOSE 89 85 81  BUN 20 34* 47*  CREATININE 4.90* 7.09* 8.93*  CALCIUM 8.0* 8.3* 8.6*  PHOS  --  9.3* 11.2*   Liver Function Tests: Recent Labs  Lab 09/08/19 2013 09/08/19 2013 09/09/19 0615 09/10/19 0403 09/11/19 0242  AST 15  --  17  --   --   ALT 12  --  13  --   --   ALKPHOS 100  --  108  --   --   BILITOT 0.7  --  0.7  --   --   PROT 7.3  --  7.5  --   --   ALBUMIN 2.6*   < > 2.5* 2.4* 2.1*   < > = values in this interval not displayed.   No results for input(s): LIPASE, AMYLASE in the last 168 hours. Recent Labs  Lab 09/08/19 0704  AMMONIA 36*   CBC: Recent Labs  Lab 09/08/19 1904 09/08/19 1951 09/09/19 0615  WBC 9.8  --  8.6  NEUTROABS  7.5  --  5.5  HGB 10.7* 11.2* 11.1*  HCT 35.3* 33.0* 34.7*  MCV 92.9  --  90.6  PLT 277  --  261   Cardiac Enzymes: No results for input(s): CKTOTAL, CKMB, CKMBINDEX, TROPONINI in the last 168 hours. CBG: Recent Labs  Lab 09/08/19 1947  GLUCAP 86    Iron Studies: No results for input(s): IRON, TIBC, TRANSFERRIN, FERRITIN in the last 72 hours. Studies/Results: DG CHEST PORT 1 VIEW  Result Date: 09/10/2019 CLINICAL DATA:  Shortness of breath COPD EXAM: PORTABLE CHEST 1 VIEW COMPARISON:  09/08/2019 FINDINGS: RIGHT-sided dual lumen dialysis catheter in place. Cardiomediastinal contours and hilar structures are stable with catheter terminating at the caval to atrial junction/upper RIGHT atrium with unchanged appearance. Lungs are clear. No sign of effusion. Thoracic spinal fusion as before. Limited assessment of skeletal structures otherwise without acute process. IMPRESSION: No active disease. Electronically Signed   By: Zetta Bills M.D.   On:  09/10/2019 08:54    Medications: Infusions:   Scheduled Medications: . atorvastatin  40 mg Oral Daily  . budesonide  0.5 mg Inhalation BID  . calcitRIOL  0.25 mcg Oral Q T,Th,Sat-1800  . calcium acetate  1,334 mg Oral TID WC  . Chlorhexidine Gluconate Cloth  6 each Topical Q0600  . Chlorhexidine Gluconate Cloth  6 each Topical Q0600  . gabapentin  300 mg Oral Daily  . heparin  5,000 Units Subcutaneous Q8H  . levothyroxine  50 mcg Oral QAC breakfast  . predniSONE  40 mg Oral Q breakfast    have reviewed scheduled and prn medications.  Physical Exam: General:NAD, lying on bed comfortable, occasional myoclonus of extremities Heart:RRR, s1s2 nl Lungs: Some expiratory wheeze, no increased work of breathing . Abdomen:soft, Non-tender, non-distended Extremities:No edema Dialysis Access: TDC.  Katelyne Galster Prasad Kamiya Acord 09/11/2019,10:29 AM  LOS: 2 days  Pager: [phone]

## 2019-09-12 LAB — RENAL FUNCTION PANEL
Albumin: 2.1 g/dL — ABNORMAL LOW (ref 3.5–5.0)
Anion gap: 9 (ref 5–15)
BUN: 26 mg/dL — ABNORMAL HIGH (ref 8–23)
CO2: 28 mmol/L (ref 22–32)
Calcium: 8.4 mg/dL — ABNORMAL LOW (ref 8.9–10.3)
Chloride: 98 mmol/L (ref 98–111)
Creatinine, Ser: 5.14 mg/dL — ABNORMAL HIGH (ref 0.61–1.24)
GFR calc Af Amer: 13 mL/min — ABNORMAL LOW (ref >60)
GFR calc non Af Amer: 11 mL/min — ABNORMAL LOW (ref >60)
Glucose, Bld: 107 mg/dL — ABNORMAL HIGH (ref 70–99)
Phosphorus: 6.6 mg/dL — ABNORMAL HIGH (ref 2.5–4.6)
Potassium: 4.5 mmol/L (ref 3.5–5.1)
Sodium: 135 mmol/L (ref 135–145)

## 2019-09-12 MED ORDER — CHLORHEXIDINE GLUCONATE CLOTH 2 % EX PADS
6.0000 | MEDICATED_PAD | Freq: Every day | CUTANEOUS | Status: DC
  Administered 2019-09-13 – 2019-09-15 (×2): 6 via TOPICAL

## 2019-09-12 MED ORDER — CALCIUM CARBONATE ANTACID 500 MG PO CHEW
1.0000 | CHEWABLE_TABLET | Freq: Two times a day (BID) | ORAL | Status: DC
  Administered 2019-09-12 – 2019-09-17 (×8): 200 mg via ORAL
  Filled 2019-09-12 (×8): qty 1

## 2019-09-12 MED ORDER — CLONAZEPAM 1 MG PO TABS
1.0000 mg | ORAL_TABLET | Freq: Every day | ORAL | Status: DC
  Administered 2019-09-12 – 2019-09-16 (×5): 1 mg via ORAL
  Filled 2019-09-12 (×5): qty 1

## 2019-09-12 NOTE — Progress Notes (Signed)
PROGRESS NOTE    Ralph Becker  KWI:[id number] DOB: 08-26-1960 DOA: 09/08/2019 PCP: Patient, No Pcp Per   Brief Narrative:  Patient is a 59 year old male with history of COPD, end-stage renal disease on dialysis on TTS, hypertension, hyperlipidemia, nicotine dependence, hepatitis C, seizure disorder but not antiepileptics, chronic back pain who presented to the emergency department via EMS brought after suspected episode of seizure.  Patient was unable to contribute any information due to confusion.  He briefly was able to explain that he was sitting in the wheelchair when he experienced started feeling shaky.  As per ED notes, seizure lasted for 1 minute.  Upon further questioning, patient admitted that he missed his dialysis session.On presentation his potassium was 7.7.  Patient was given calcium gluconate, insulin, dextrose and Lokelma.  Nephrology consulted and he underwent emergent dialysis.  Neurology was also consulted regarding suspicion for seizure but not started on antiepileptics because it was suspected to be from  uremia.  Hospital course remarkable for new oxygen requirement most likely secondary to his COPD.  Patient evaluated by PT/OT and recommended skilled facility on discharge.  He remains hemodynamically stable.  He is waiting for bed in SNF.  He is medically stable for discharge soon as bed is available.  Assessment & Plan:   Principal Problem:   Hyperkalemia Active Problems:   Mixed hyperlipidemia   Acute metabolic encephalopathy   Hypertensive urgency   Seizure (HCC)   COPD (chronic obstructive pulmonary disease) (HCC)   Hyperkalemia: Presented with potassium of more than 7.  He was given calcium gluconate, insulin, dextrose, Lokelma.    He underwent emergent dialysis.    ESRD on dialysis: Typically dialyzed on TTS is scheduled.  Nephrology following.  Emergent dialysis has been done here.  He said he missed a session of dialysis, he has history of noncompliance.   Nephrology following here for dialysis.  Patient has been advised for compliance on dialysis.  Acute metabolic encephalopathy: Present on admission.Secondary to uremic encephalopathy.  CT head was unremarkable.  Normal folic acid, vitamin M42.  Currently he is alert and oriented.  Hypothyroidism: Elevated TSH in the range of 8.  Normal T4.  Started on low-dose Synthyroid.  Hypertensive urgency: Presented with severe hypertension most likely secondary to missing dialysis.  Improved after dialysis.  Started home regimen but he became hypotensive and head to be given a bolus of 500cc  on 09/09/2019, so home medications are on hold.  Blood pressure stable without any antihypertensives.  Seizure: As per the EMS report, he had 1 minute of seizure activity.    Patient was hospitalized at [facility] in March for seizure but was not sent home on any medications because it was also suspected to be from uremia.   Case discussed with neurology.  Since seizure could have been precipitated from uremia, AED not started.  EEG was consistent with toxic/metabolic encephalopathy, no definite epileptiform discharges.    Hyperlipidemia: Continue statin  Acute hypoxic respiratory failure/COPD: Mild wheezing was noted on presentation.  Continue as needed bronchodilator therapy.  Currently on 2 L of oxygen per minute.  Not on oxygen at home.  If he continues to need oxygen, it is okay to send him on oxygen to skilled  nursing facility. Also started on prednisone for wheezes.  Smoker: Smokes half pack a day.  Counseled for cessation.  Anxiety: Noted to be jittery,having jerking movements,anxious yesterday but now much better today.  Restarted home Klonopin.  Debility/deconditioning: Patient seen by PT/OT  and recommended skilled nursing facility on discharge.  Social worker following.            DVT prophylaxis:Heparin Thorp Code Status: Full Family Communication: Discussed with patient in  detail Status ON:[id number]   Dispo: The patient is from: Home              Anticipated d/c is to:SNF              Anticipated d/c date is: On the day of bed availability              Patient currently is medically stable for discharge.     Consultants: Nephrology, neurology  Procedures:None  Antimicrobials:  Anti-infectives (From admission, onward)   None      Subjective: Patient seen and examined at the bedside this morning.  Hemodynamically stable.  On room air during my evaluation.  Denies any shortness of breath or cough.  Anxiety symptoms much better.  Denies any new complaints.  Objective: Vitals:   09/11/19 2321 09/12/19 0343 09/12/19 0743 09/12/19 0806  BP: (!) 121/98 (!) 145/87    Pulse: 80 67 67   Resp: 20 20 17    Temp: 98 F (36.7 C) 98 F (36.7 C)  98.1 F (36.7 C)  TempSrc: Oral Axillary  Oral  SpO2: 100% 95% 97%   Weight:      Height:        Intake/Output Summary (Last 24 hours) at 09/12/2019 0812 Last data filed at 09/11/2019 1800 Gross per 24 hour  Intake 240 ml  Output 2600 ml  Net -2360 ml   Filed Weights   09/09/19 0400 09/11/19 1200 09/11/19 1610  Weight: 74.8 kg 59.1 kg 58.2 kg    Examination:   General exam: Deconditioned, debilitated, malnourished  Respiratory system: Bilateral equal air entry, normal vesicular breath sounds, no wheezes or crackles  Cardiovascular system: S1 & S2 heard, RRR. No JVD, murmurs, rubs, gallops or clicks.  Dialysis catheter on the right chest Gastrointestinal system: Abdomen is nondistended, soft and nontender. No organomegaly or masses felt. Normal bowel sounds heard. Central nervous system: Alert and oriented. No focal neurological deficits. Extremities: No edema, no clubbing ,no cyanosis Skin: No rashes, lesions or ulcers,no icterus ,no pallor    Data Reviewed: I have personally reviewed following labs and imaging studies  CBC: Recent Labs  Lab 09/08/19 1904 09/08/19 1951 09/09/19 0615  09/11/19 1109  WBC 9.8  --  8.6 8.8  NEUTROABS 7.5  --  5.5  --   HGB 10.7* 11.2* 11.1* 10.2*  HCT 35.3* 33.0* 34.7* 33.9*  MCV 92.9  --  90.6 93.4  PLT 277  --  261 244   Basic Metabolic Panel: Recent Labs  Lab 09/08/19 2013 09/09/19 0615 09/10/19 0403 09/11/19 0242 09/12/19 0404  NA 137 136 137 137 135  K >7.5* 4.2 5.3* 5.5* 4.5  CL 99 98 99 97* 98  CO2 18* 26 26 27 28   GLUCOSE 81 89 85 81 107*  BUN 84* 20 34* 47* 26*  CREATININE 12.25* 4.90* 7.09* 8.93* 5.14*  CALCIUM 9.1 8.0* 8.3* 8.6* 8.4*  MG  --  1.6* 2.4  --   --   PHOS  --   --  9.3* 11.2* 6.6*   GFR: Estimated Creatinine Clearance: 12.4 mL/min (A) (by C-G formula based on SCr of 5.14 mg/dL (H)). Liver Function Tests: Recent Labs  Lab 09/08/19 2013 09/09/19 0615 09/10/19 0403 09/11/19 0242 09/12/19 0404  AST 15 17  --   --   --  ALT 12 13  --   --   --   ALKPHOS 100 108  --   --   --   BILITOT 0.7 0.7  --   --   --   PROT 7.3 7.5  --   --   --   ALBUMIN 2.6* 2.5* 2.4* 2.1* 2.1*   No results for input(s): LIPASE, AMYLASE in the last 168 hours. Recent Labs  Lab 09/08/19 0704  AMMONIA 36*   Coagulation Profile: No results for input(s): INR, PROTIME in the last 168 hours. Cardiac Enzymes: No results for input(s): CKTOTAL, CKMB, CKMBINDEX, TROPONINI in the last 168 hours. BNP (last 3 results) No results for input(s): PROBNP in the last 8760 hours. HbA1C: No results for input(s): HGBA1C in the last 72 hours. CBG: Recent Labs  Lab 09/08/19 1947  GLUCAP 86   Lipid Profile: No results for input(s): CHOL, HDL, LDLCALC, TRIG, CHOLHDL, LDLDIRECT in the last 72 hours. Thyroid Function Tests: No results for input(s): TSH, T4TOTAL, FREET4, T3FREE, THYROIDAB in the last 72 hours. Anemia Panel: No results for input(s): VITAMINB12, FOLATE, FERRITIN, TIBC, IRON, RETICCTPCT in the last 72 hours. Sepsis Labs: Recent Labs  Lab 09/08/19 1945  LATICACIDVEN 1.1    Recent Results (from the past 240  hour(s))  SARS Coronavirus 2 by RT PCR (hospital order, performed in [facility] hospital lab) Nasopharyngeal Nasopharyngeal Swab     Status: None   Collection Time: 09/08/19 10:11 PM   Specimen: Nasopharyngeal Swab  Result Value Ref Range Status   SARS Coronavirus 2 NEGATIVE NEGATIVE Final    Comment: (NOTE) SARS-CoV-2 target nucleic acids are NOT DETECTED.  The SARS-CoV-2 RNA is generally detectable in upper and lower respiratory specimens during the acute phase of infection. The lowest concentration of SARS-CoV-2 viral copies this assay can detect is 250 copies / mL. A negative result does not preclude SARS-CoV-2 infection and should not be used as the sole basis for treatment or other patient management decisions.  A negative result may occur with improper specimen collection / handling, submission of specimen other than nasopharyngeal swab, presence of viral mutation(s) within the areas targeted by this assay, and inadequate number of viral copies (<250 copies / mL). A negative result must be combined with clinical observations, patient history, and epidemiological information.  Fact Sheet for Patients:   StrictlyIdeas.no  Fact Sheet for Healthcare Providers: BankingDealers.co.za  This test is not yet approved or  cleared by the [place] FDA and has been authorized for detection and/or diagnosis of SARS-CoV-2 by FDA under an Emergency Use Authorization (EUA).  This EUA will remain in effect (meaning this test can be used) for the duration of the COVID-19 declaration under Section 564(b)(1) of the Act, 21 U.S.C. section 360bbb-3(b)(1), unless the authorization is terminated or revoked sooner.  Performed at [facility], [place] [address], [place], [place] [address]   Urine culture     Status: Abnormal   Collection Time: 09/08/19 10:22 PM   Specimen: Urine, Random  Result Value Ref Range Status   Specimen Description  URINE, RANDOM  Final   Special Requests NONE  Final   Culture (A)  Final    <10,000 COLONIES/mL INSIGNIFICANT GROWTH Performed at [facility], [place] [address] [place], [place] [address]    Report Status 09/10/2019 FINAL  Final         Radiology Studies: DG CHEST PORT 1 VIEW  Result Date: 09/10/2019 CLINICAL DATA:  Shortness of breath COPD  EXAM: PORTABLE CHEST 1 VIEW COMPARISON:  09/08/2019 FINDINGS: RIGHT-sided dual lumen dialysis catheter in place. Cardiomediastinal contours and hilar structures are stable with catheter terminating at the caval to atrial junction/upper RIGHT atrium with unchanged appearance. Lungs are clear. No sign of effusion. Thoracic spinal fusion as before. Limited assessment of skeletal structures otherwise without acute process. IMPRESSION: No active disease. Electronically Signed   By: Zetta Bills M.D.   On: 09/10/2019 08:54        Scheduled Meds: . atorvastatin  40 mg Oral Daily  . budesonide  0.5 mg Inhalation BID  . calcitRIOL  0.25 mcg Oral Q T,Th,Sat-1800  . calcium acetate  1,334 mg Oral TID WC  . Chlorhexidine Gluconate Cloth  6 each Topical Q0600  . Chlorhexidine Gluconate Cloth  6 each Topical Q0600  . gabapentin  300 mg Oral Daily  . heparin  5,000 Units Subcutaneous Q8H  . levothyroxine  50 mcg Oral QAC breakfast  . predniSONE  40 mg Oral Q breakfast   Continuous Infusions:   LOS: 3 days    Time spent: 25 mins,More than 50% of that time was spent in counseling and/or coordination of care.      Shelly Coss, MD [facility] P7/11/2019, 8:12 AM

## 2019-09-12 NOTE — Plan of Care (Signed)
Patient expressing overall improvement and feeling better

## 2019-09-12 NOTE — TOC Initial Note (Signed)
Transition of Care [facility]) - Initial/Assessment Note    Patient Details  Name: Ralph Becker MRN: [id number] Date of Birth: June 22, 1960  Transition of Care [facility]) CM/SW Contact:    Geralynn Ochs, LCSW Phone Number: 09/12/2019, 12:09 PM  Clinical Narrative:     CSW met with patient to discuss recommendation for SNF. Patient in agreement, asked for placement near [facility]. CSW explained that placement will have to be near [facility] for patient's dialysis center. Patient is set with [facility] for transportation already. Patient has been vaccinated for COVID. CSW faxed out referral, will follow.              Expected Discharge Plan: Skilled Nursing Facility Barriers to Discharge: Insurance Authorization, Continued Medical Work up   Patient Goals and CMS Choice Patient states their goals for this hospitalization and ongoing recovery are:: to get rehab near [facility].gov Compare Post Acute Care list provided to:: Patient Choice offered to / list presented to : Patient  Expected Discharge Plan and Services Expected Discharge Plan: [place] Choice: [place] arrangements for the past 2 months: Single Family Home                                      Prior Living Arrangements/Services Living arrangements for the past 2 months: Single Family Home Lives with:: Self Patient language and need for interpreter reviewed:: No Do you feel safe going back to the place where you live?: Yes      Need for Family Participation in Patient Care: No (Comment) Care giver support system in place?: No (comment)   Criminal Activity/Legal Involvement Pertinent to Current Situation/Hospitalization: No - Comment as needed  Activities of Daily Living Home Assistive Devices/Equipment: Cane (specify quad or straight) (quad) ADL Screening (condition at time of admission) Patient's cognitive ability adequate to safely complete daily  activities?: Yes Is the patient deaf or have difficulty hearing?: No Does the patient have difficulty seeing, even when wearing glasses/contacts?: No Does the patient have difficulty concentrating, remembering, or making decisions?: No Patient able to express need for assistance with ADLs?: No Does the patient have difficulty dressing or bathing?: No Independently performs ADLs?: Yes (appropriate for developmental age) Does the patient have difficulty walking or climbing stairs?: No Weakness of Legs: Right (unsure of when it started ) Weakness of Arms/Hands: None  Permission Sought/Granted Permission sought to share information with : Chartered certified accountant granted to share information with : Yes, Verbal Permission Granted     Permission granted to share info w AGENCY: SNF        Emotional Assessment Appearance:: Appears stated age Attitude/Demeanor/Rapport: Engaged Affect (typically observed): Appropriate Orientation: : Oriented to Self, Oriented to Place, Oriented to  Time, Oriented to Situation      Admission diagnosis:  Hyperkalemia [E87.5] Seizure ([place]) [R56.9] ESRD (end stage renal disease) ([place]) [N18.6] Patient Active Problem List   Diagnosis Date Noted  . Hyperkalemia 09/08/2019  . Seizure disorder ([place]) 09/08/2019  . Acute metabolic encephalopathy 55/37/4827  . Hypertensive urgency 09/08/2019  . Seizure ([place]) 09/08/2019  . COPD (chronic obstructive pulmonary disease) ([place]) 09/08/2019  . Mixed hyperlipidemia   . Essential hypertension    PCP:  Patient, No Pcp Per Pharmacy:   [facility] [place], [place]  [place] [address] Phone: [phone] Fax: [phone]     Social Determinants of Health (SDOH) Interventions    Readmission Risk Interventions No flowsheet data found.
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
## 2019-09-12 NOTE — Progress Notes (Signed)
[facility] Progress Note  Subjective: seen in room, in good spirits, a bit anxious and tremulous. No SOB, cough or CP  Vitals:   09/11/19 2321 09/12/19 0343 09/12/19 0743 09/12/19 0806  BP: (!) 121/98 (!) 145/87    Pulse: 80 67 67   Resp: 20 20 17    Temp: 98 F (36.7 C) 98 F (36.7 C)  98.1 F (36.7 C)  TempSrc: Oral Axillary  Oral  SpO2: 100% 95% 97%   Weight:      Height:        Exam: NAD nasal O2 occ myoclonus RRR no RG Chest cta bilat  Abd soft ntnd  Ext no edema  Nonfocal, Ox 3   TDC in chest    OP HD: TTS [place]  4h  60kg  2/2.25 bath  TDC  Hep 4000   Calc 0.25 tiw   venofer 100 x 10   Assessment/ Plan: 1. Severe hyperkalemia: Due to missed dialysis treatment.  Required urgent dialysis on 7/5 and then had HD yest again. Resolved.  2. SOB/ hypoxemic resp failure: no edema on exam and CXR unremarkable. Getting bronchodilators per primary team. Under dry wt by 2kg.  3. ESRD: HD TTS. Had HD yest. Next HD Sat.  4. Anemia of ESRD: Hemoglobin at goal.  Monitor. 5. MBD ckd: Phosphorus level very high.  Started calcium acetate.   6. HTN/volume: Blood pressure and volume status acceptable.  Continue current antihypertensives.  Monitor BP. Under dry wt by 2kg, no edema on exam.  7. Seizure disorder: CT head with no acute finding.  Per primary team. 8. Dispo - SNF placement in process     Ralph Becker 09/12/2019, 9:07 AM   Recent Labs  Lab 09/09/19 0615 09/10/19 0403 09/11/19 0242 09/11/19 1109 09/12/19 0404  K 4.2   < > 5.5*  --  4.5  BUN 20   < > 47*  --  26*  CREATININE 4.90*   < > 8.93*  --  5.14*  CALCIUM 8.0*   < > 8.6*  --  8.4*  PHOS  --    < > 11.2*  --  6.6*  HGB 11.1*  --   --  10.2*  --    < > = values in this interval not displayed.   Inpatient medications: . atorvastatin  40 mg Oral Daily  . budesonide  0.5 mg Inhalation BID  . calcitRIOL  0.25 mcg Oral Q T,Th,Sat-1800  . calcium acetate  1,334 mg Oral TID WC  .  Chlorhexidine Gluconate Cloth  6 each Topical Q0600  . Chlorhexidine Gluconate Cloth  6 each Topical Q0600  . gabapentin  300 mg Oral Daily  . heparin  5,000 Units Subcutaneous Q8H  . levothyroxine  50 mcg Oral QAC breakfast  . predniSONE  40 mg Oral Q breakfast    acetaminophen **OR** acetaminophen, ALPRAZolam, hydrALAZINE, ipratropium-albuterol, ondansetron **OR** ondansetron (ZOFRAN) IV, polyethylene glycol
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
## 2019-09-13 DIAGNOSIS — E875 Hyperkalemia: Secondary | ICD-10-CM

## 2019-09-13 LAB — RENAL FUNCTION PANEL
Albumin: 2.2 g/dL — ABNORMAL LOW (ref 3.5–5.0)
Anion gap: 12 (ref 5–15)
BUN: 43 mg/dL — ABNORMAL HIGH (ref 8–23)
CO2: 27 mmol/L (ref 22–32)
Calcium: 8.7 mg/dL — ABNORMAL LOW (ref 8.9–10.3)
Chloride: 98 mmol/L (ref 98–111)
Creatinine, Ser: 7.13 mg/dL — ABNORMAL HIGH (ref 0.61–1.24)
GFR calc Af Amer: 9 mL/min — ABNORMAL LOW (ref >60)
GFR calc non Af Amer: 8 mL/min — ABNORMAL LOW (ref >60)
Glucose, Bld: 106 mg/dL — ABNORMAL HIGH (ref 70–99)
Phosphorus: 8.1 mg/dL — ABNORMAL HIGH (ref 2.5–4.6)
Potassium: 5.1 mmol/L (ref 3.5–5.1)
Sodium: 137 mmol/L (ref 135–145)

## 2019-09-13 NOTE — Progress Notes (Signed)
[facility] Progress Note  Subjective: seen in room, no new c/o's  Vitals:   09/13/19 0020 09/13/19 0359 09/13/19 0740 09/13/19 0752  BP: (!) 88/63 100/73  102/71  Pulse: 78 74  71  Resp: 16 17  17   Temp: 98 F (36.7 C) 98.6 F (37 C)  (!) 97.5 F (36.4 C)  TempSrc: Oral Oral  Oral  SpO2: 91% 91% 97% 97%  Weight:      Height:        Exam: NAD nasal O2 RRR no RG Chest cta bilat  Abd soft ntnd  Ext no edema  Nonfocal, Ox 3   TDC in chest    OP HD: TTS [facility]  4h  60kg  2/2.25 bath  TDC  Hep 4000   Calc 0.25 tiw   venofer 100 x 10   Assessment/ Plan: 1. Severe hyperkalemia: Due to missed dialysis treatment.  Required urgent dialysis on 7/5 and then had HD yest again. Resolved.  2. SOB/ hypoxemic resp failure: no edema on exam and CXR unremarkable. Getting bronchodilators per primary team. Under dry wt by 2kg.  3. ESRD: HD TTS. Had HD yest. Next HD today (or possibly tomorrow d/t high census today)  4. Anemia of ESRD: Hemoglobin at goal.  Monitor. 5. MBD ckd: Phosphorus level very high.  Started calcium acetate.   6. HTN/volume: Blood pressure and volume status acceptable.  Continue current antihypertensives.  Monitor BP. Under dry wt by 2kg, no edema on exam.  7. Seizure disorder: CT head with no acute finding.  Per primary team. 8. Dispo - SNF placement in process     Ralph Becker 09/13/2019, 4:08 PM   Recent Labs  Lab 09/09/19 0615 09/10/19 0403 09/11/19 0242 09/11/19 1109 09/12/19 0404 09/13/19 0402  K 4.2   < >   < >  --  4.5 5.1  BUN 20   < >   < >  --  26* 43*  CREATININE 4.90*   < >   < >  --  5.14* 7.13*  CALCIUM 8.0*   < >   < >  --  8.4* 8.7*  PHOS  --    < >   < >  --  6.6* 8.1*  HGB 11.1*  --   --  10.2*  --   --    < > = values in this interval not displayed.   Inpatient medications: . atorvastatin  40 mg Oral Daily  . budesonide  0.5 mg Inhalation BID  . calcitRIOL  0.25 mcg Oral Q T,Th,Sat-1800  . calcium acetate  1,334  mg Oral TID WC  . calcium carbonate  1 tablet Oral BID WC  . Chlorhexidine Gluconate Cloth  6 each Topical Q0600  . Chlorhexidine Gluconate Cloth  6 each Topical Q0600  . Chlorhexidine Gluconate Cloth  6 each Topical Q0600  . clonazePAM  1 mg Oral QHS  . gabapentin  300 mg Oral Daily  . heparin  5,000 Units Subcutaneous Q8H  . predniSONE  40 mg Oral Q breakfast    acetaminophen **OR** acetaminophen, hydrALAZINE, ipratropium-albuterol, ondansetron **OR** ondansetron (ZOFRAN) IV, polyethylene glycol

## 2019-09-13 NOTE — Progress Notes (Signed)
PROGRESS NOTE    Ralph Becker  KZL:[id number] DOB: 1960/07/19 DOA: 09/08/2019 PCP: Patient, No Pcp Per   Brief Narrative:  Patient is a 59 year old male with history of COPD, end-stage renal disease on dialysis on TTS, hypertension, hyperlipidemia, nicotine dependence, hepatitis C, seizure disorder but not antiepileptics, chronic back pain who presented to the emergency department via EMS brought after suspected episode of seizure.  Patient was unable to contribute any information due to confusion.  He briefly was able to explain that he was sitting in the wheelchair when he experienced started feeling shaky.  As per ED notes, seizure lasted for 1 minute.  Upon further questioning, patient admitted that he missed his dialysis session.On presentation his potassium was 7.7.  Patient was given calcium gluconate, insulin, dextrose and Lokelma.  Nephrology consulted and he underwent emergent dialysis.  Neurology was also consulted regarding suspicion for seizure but not started on antiepileptics because it was suspected to be from  uremia.  Hospital course remarkable for new oxygen requirement most likely secondary to his COPD.  Patient evaluated by PT/OT and recommended skilled facility on discharge.  He remains hemodynamically stable.  He is waiting for bed in SNF.  He is medically stable for discharge soon as bed is available.  Assessment & Plan:   Principal Problem:   Hyperkalemia Active Problems:   Mixed hyperlipidemia   Acute metabolic encephalopathy   Hypertensive urgency   Seizure (HCC)   COPD (chronic obstructive pulmonary disease) (HCC)   Hyperkalemia: Presented with potassium of more than 7.  He was given calcium gluconate, insulin, dextrose, Lokelma.    He underwent emergent dialysis.  Now normal.  ESRD on dialysis: Typically dialyzed on TTS is scheduled.  Nephrology following.  Emergent dialysis has been done here.  He said he missed a session of dialysis, he has history of  noncompliance.  Nephrology following here for dialysis.  Patient has been advised for compliance on dialysis.   Acute metabolic encephalopathy: Present on admission.Secondary to uremic encephalopathy.  CT head was unremarkable.  Normal folic acid, vitamin T90.  Currently he is alert and oriented.  Subclinical hypothyroidism: Elevated TSH in the range of 8.  Normal T4.  Does not indicate Synthroid.  We will discontinue that.  Hypertensive urgency: Presented with severe hypertension most likely secondary to missing dialysis.  Improved after dialysis.  Started home regimen but he became hypotensive and hed to be given a bolus of 500cc  on 09/09/2019, so home medications are on hold.  Blood pressure on low normal side despite of holding antihypertensives.  Seizure: As per the EMS report, he had 1 minute of seizure activity.    Patient was [facility] at [facility] in [facility] for seizure but was not sent home on any medications because it was also suspected to be from uremia.   Case discussed with neurology.  Since seizure could have been precipitated from uremia, AED not started.  EEG was consistent with toxic/metabolic encephalopathy, no definite epileptiform discharges.    Hyperlipidemia: Continue statin  Acute hypoxic respiratory failure/COPD: Mild wheezing was noted on presentation.  Continue as needed bronchodilator therapy.  Currently on 2-3 L of oxygen per minute.  Not on oxygen at home.  If he continues to need oxygen, it is okay to send him on oxygen to skilled  nursing facility. Also started on prednisone for wheezes however he has no wheezes today.  We will continue total of 5 days.  Smoker: Smokes half pack a day.  Counseled  for cessation.  Anxiety: Noted to be jittery,having jerking movements,anxious yesterday but now much better today.  Restarted home Klonopin.  Debility/deconditioning: Patient seen by PT/OT and recommended skilled nursing facility on discharge.  Social worker  following.            DVT prophylaxis:Heparin [place]ance Code Status: Full Family Communication: Discussed with patient in detail Status UM:[id number]   Dispo: The patient is from: Home              Anticipated d/c is to:SNF              Anticipated d/c date is: 09/15/2019.              Patient currently is medically stable for discharge.     Consultants: Nephrology, neurology  Procedures:None  Antimicrobials:  Anti-infectives (From admission, onward)   None      Subjective: Patient seen and examined.  He has no complaints.  Objective: Vitals:   09/13/19 0020 09/13/19 0359 09/13/19 0740 09/13/19 0752  BP: (!) 88/63 100/73  102/71  Pulse: 78 74  71  Resp: 16 17  17   Temp: 98 F (36.7 C) 98.6 F (37 C)  (!) 97.5 F (36.4 C)  TempSrc: Oral Oral  Oral  SpO2: 91% 91% 97% 97%  Weight:      Height:       No intake or output data in the 24 hours ending 09/13/19 1226 Filed Weights   09/09/19 0400 09/11/19 1200 09/11/19 1610  Weight: 74.8 kg 59.1 kg 58.2 kg    Examination:  General exam: Appears calm and comfortable  Respiratory system: Clear to auscultation. Respiratory effort normal. Cardiovascular system: S1 & S2 heard, RRR. No JVD, murmurs, rubs, gallops or clicks. No pedal edema. Gastrointestinal system: Abdomen is nondistended, soft and nontender. No organomegaly or masses felt. Normal bowel sounds heard. Central nervous system: Alert and oriented. No focal neurological deficits. Extremities: Symmetric 5 x 5 power. Skin: No rashes, lesions or ulcers.  Psychiatry: Judgement and insight appear normal. Mood & affect appropriate.   Data Reviewed: I have personally reviewed following labs and imaging studies  CBC: Recent Labs  Lab 09/08/19 1904 09/08/19 1951 09/09/19 0615 09/11/19 1109  WBC 9.8  --  8.6 8.8  NEUTROABS 7.5  --  5.5  --   HGB 10.7* 11.2* 11.1* 10.2*  HCT 35.3* 33.0* 34.7* 33.9*  MCV 92.9  --  90.6 93.4  PLT 277  --  261 540   Basic  Metabolic Panel: Recent Labs  Lab 09/09/19 0615 09/10/19 0403 09/11/19 0242 09/12/19 0404 09/13/19 0402  NA 136 137 137 135 137  K 4.2 5.3* 5.5* 4.5 5.1  CL 98 99 97* 98 98  CO2 26 26 27 28 27   GLUCOSE 89 85 81 107* 106*  BUN 20 34* 47* 26* 43*  CREATININE 4.90* 7.09* 8.93* 5.14* 7.13*  CALCIUM 8.0* 8.3* 8.6* 8.4* 8.7*  MG 1.6* 2.4  --   --   --   PHOS  --  9.3* 11.2* 6.6* 8.1*   GFR: Estimated Creatinine Clearance: 9 mL/min (A) (by C-G formula based on SCr of 7.13 mg/dL (H)). Liver Function Tests: Recent Labs  Lab 09/08/19 2013 09/08/19 2013 09/09/19 0615 09/10/19 0403 09/11/19 0242 09/12/19 0404 09/13/19 0402  AST 15  --  17  --   --   --   --   ALT 12  --  13  --   --   --   --  ALKPHOS 100  --  108  --   --   --   --   BILITOT 0.7  --  0.7  --   --   --   --   PROT 7.3  --  7.5  --   --   --   --   ALBUMIN 2.6*   < > 2.5* 2.4* 2.1* 2.1* 2.2*   < > = values in this interval not displayed.   No results for input(s): LIPASE, AMYLASE in the last 168 hours. Recent Labs  Lab 09/08/19 0704  AMMONIA 36*   Coagulation Profile: No results for input(s): INR, PROTIME in the last 168 hours. Cardiac Enzymes: No results for input(s): CKTOTAL, CKMB, CKMBINDEX, TROPONINI in the last 168 hours. BNP (last 3 results) No results for input(s): PROBNP in the last 8760 hours. HbA1C: No results for input(s): HGBA1C in the last 72 hours. CBG: Recent Labs  Lab 09/08/19 1947  GLUCAP 86   Lipid Profile: No results for input(s): CHOL, HDL, LDLCALC, TRIG, CHOLHDL, LDLDIRECT in the last 72 hours. Thyroid Function Tests: No results for input(s): TSH, T4TOTAL, FREET4, T3FREE, THYROIDAB in the last 72 hours. Anemia Panel: No results for input(s): VITAMINB12, FOLATE, FERRITIN, TIBC, IRON, RETICCTPCT in the last 72 hours. Sepsis Labs: Recent Labs  Lab 09/08/19 1945  LATICACIDVEN 1.1    Recent Results (from the past 240 hour(s))  SARS Coronavirus 2 by RT PCR (hospital order,  performed in [facility] hospital lab) Nasopharyngeal Nasopharyngeal Swab     Status: None   Collection Time: 09/08/19 10:11 PM   Specimen: Nasopharyngeal Swab  Result Value Ref Range Status   SARS Coronavirus 2 NEGATIVE NEGATIVE Final    Comment: (NOTE) SARS-CoV-2 target nucleic acids are NOT DETECTED.  The SARS-CoV-2 RNA is generally detectable in upper and lower respiratory specimens during the acute phase of infection. The lowest concentration of SARS-CoV-2 viral copies this assay can detect is 250 copies / mL. A negative result does not preclude SARS-CoV-2 infection and should not be used as the sole basis for treatment or other patient management decisions.  A negative result may occur with improper specimen collection / handling, submission of specimen other than nasopharyngeal swab, presence of viral mutation(s) within the areas targeted by this assay, and inadequate number of viral copies (<250 copies / mL). A negative result must be combined with clinical observations, patient history, and epidemiological information.  Fact Sheet for Patients:   StrictlyIdeas.no  Fact Sheet for Healthcare Providers: BankingDealers.co.za  This test is not yet approved or  cleared by the [place] FDA and has been authorized for detection and/or diagnosis of SARS-CoV-2 by FDA under an Emergency Use Authorization (EUA).  This EUA will remain in effect (meaning this test can be used) for the duration of the COVID-19 declaration under Section 564(b)(1) of the Act, 21 U.S.C. section 360bbb-3(b)(1), unless the authorization is terminated or revoked sooner.  Performed at [facility], [place] [address] [place], [place] [address]   Urine culture     Status: Abnormal   Collection Time: 09/08/19 10:22 PM   Specimen: Urine, Random  Result Value Ref Range Status   Specimen Description URINE, RANDOM  Final   Special Requests NONE  Final    Culture (A)  Final    <10,000 COLONIES/mL INSIGNIFICANT GROWTH Performed at [facility], [place] [address] [place], [place] [address]    Report Status 09/10/2019 FINAL  Final     Radiology  Studies: No results found.   Scheduled Meds: . atorvastatin  40 mg Oral Daily  . budesonide  0.5 mg Inhalation BID  . calcitRIOL  0.25 mcg Oral Q T,Th,Sat-1800  . calcium acetate  1,334 mg Oral TID WC  . calcium carbonate  1 tablet Oral BID WC  . Chlorhexidine Gluconate Cloth  6 each Topical Q0600  . Chlorhexidine Gluconate Cloth  6 each Topical Q0600  . Chlorhexidine Gluconate Cloth  6 each Topical Q0600  . clonazePAM  1 mg Oral QHS  . gabapentin  300 mg Oral Daily  . heparin  5,000 Units Subcutaneous Q8H  . levothyroxine  50 mcg Oral QAC breakfast  . predniSONE  40 mg Oral Q breakfast   Continuous Infusions:   LOS: 4 days    Time spent: 25 mins,More than 50% of that time was spent in counseling and/or coordination of care.  Darliss Cheney, MD [facility] P7/12/2019, 12:26 PM

## 2019-09-14 ENCOUNTER — Inpatient Hospital Stay (HOSPITAL_COMMUNITY): Payer: Medicaid Other

## 2019-09-14 DIAGNOSIS — J449 Chronic obstructive pulmonary disease, unspecified: Secondary | ICD-10-CM

## 2019-09-14 DIAGNOSIS — N186 End stage renal disease: Secondary | ICD-10-CM

## 2019-09-14 DIAGNOSIS — Z01810 Encounter for preprocedural cardiovascular examination: Secondary | ICD-10-CM

## 2019-09-14 DIAGNOSIS — Z992 Dependence on renal dialysis: Secondary | ICD-10-CM

## 2019-09-14 LAB — RENAL FUNCTION PANEL
Albumin: 2 g/dL — ABNORMAL LOW (ref 3.5–5.0)
Anion gap: 8 (ref 5–15)
BUN: 22 mg/dL (ref 8–23)
CO2: 28 mmol/L (ref 22–32)
Calcium: 8.2 mg/dL — ABNORMAL LOW (ref 8.9–10.3)
Chloride: 100 mmol/L (ref 98–111)
Creatinine, Ser: 4.35 mg/dL — ABNORMAL HIGH (ref 0.61–1.24)
GFR calc Af Amer: 16 mL/min — ABNORMAL LOW (ref >60)
GFR calc non Af Amer: 14 mL/min — ABNORMAL LOW (ref >60)
Glucose, Bld: 110 mg/dL — ABNORMAL HIGH (ref 70–99)
Phosphorus: 3.9 mg/dL (ref 2.5–4.6)
Potassium: 3.5 mmol/L (ref 3.5–5.1)
Sodium: 136 mmol/L (ref 135–145)

## 2019-09-14 MED ORDER — HEPARIN SODIUM (PORCINE) 1000 UNIT/ML IJ SOLN
INTRAMUSCULAR | Status: AC
  Administered 2019-09-14: 4 [IU]
  Filled 2019-09-14: qty 4

## 2019-09-14 MED ORDER — HEPARIN SODIUM (PORCINE) 1000 UNIT/ML IJ SOLN
INTRAMUSCULAR | Status: AC
  Administered 2019-09-14: 1000 [IU]
  Filled 2019-09-14: qty 4

## 2019-09-14 MED ORDER — OXYCODONE HCL 5 MG PO TABS
5.0000 mg | ORAL_TABLET | Freq: Two times a day (BID) | ORAL | Status: DC | PRN
  Administered 2019-09-14 – 2019-09-17 (×4): 5 mg via ORAL
  Filled 2019-09-14 (×4): qty 1

## 2019-09-14 NOTE — Progress Notes (Signed)
   09/14/19 0405  Hand-Off documentation  Handoff Given Given to shift RN/LPN  Report given to (Full Name) Rudene Re, RN  Handoff Received Received from shift RN/LPN  Report received from (Full Name) Camyah Pultz  Vital Signs  Temp (!) 97.3 F (36.3 C)  Temp Source Oral  Pulse Rate (!) 57  Pulse Rate Source Monitor  Resp 14  BP (!) 94/58  BP Location Right Arm  BP Method Automatic  Patient Position (if appropriate) Lying  Oxygen Therapy  SpO2 92 %  O2 Device Nasal Cannula  O2 Flow Rate (L/min) 3 L/min  Pain Assessment  Pain Scale 0-10  Pain Score 0  Post-Hemodialysis Assessment  Rinseback Volume (mL) 250 mL  KECN 276 V  Dialyzer Clearance Lightly streaked  Duration of HD Treatment -hour(s) 3 hour(s)  Hemodialysis Intake (mL) 500 mL  UF Total -Machine (mL) 566 mL  Net UF (mL) 66 mL  Tolerated HD Treatment Yes  Post-Hemodialysis Comments tx achieved as expected but BP has been low, has run even.  AVG/AVF Arterial Site Held (minutes) 0 minutes  AVG/AVF Venous Site Held (minutes) 0 minutes  Education / Care Plan  Dialysis Education Provided Yes  Note  Observations ps stable  Hemodialysis Catheter  No placement date or time found.   Placed prior to admission: Yes  Site Condition No complications  Blue Lumen Status Heparin locked;Capped (Central line)  Red Lumen Status Heparin locked;Capped (Central line)  Catheter fill solution Heparin 1000 units/ml  Catheter fill volume (Arterial) 1.8 cc  Catheter fill volume (Venous) 1.8  Dressing Type Occlusive  Dressing Status Clean;Dry;Intact  Interventions Other (Comment)  Drainage Description None

## 2019-09-14 NOTE — Progress Notes (Signed)
VASCULAR LAB    Upper extremity mapping completed.    Preliminary report:  See CV proc for preliminary results.  Aiyonna Lucado, RVT 09/14/2019, 3:14 PM

## 2019-09-14 NOTE — Consult Note (Signed)
Vascular and Vein Specialist of [facility]  Patient name: Ralph Becker MRN: [id number] DOB: [birth year] Sex: male   REQUESTING PROVIDER:   Renal   REASON FOR CONSULT:    Dialysis access  HISTORY OF PRESENT ILLNESS:   Ralph Becker is a 59 y.o. male, who I have been asked to evaluate for dialysis access.  He is right-handed.  He has a catheter in place.  The patient has a history of COPD.  He is on dialysis Tuesday Thursday Saturday via a right sided catheter.  He is medically managed for hypertension and hyperlipidemia.  He has a history of hepatitis C.  He was admitted status post a seizure.  He was hyperkalemic in the ER with a potassium of 7.7  PAST MEDICAL HISTORY    Past Medical History:  Diagnosis Date   Chronic hepatitis C ([place])    COPD (chronic obstructive pulmonary disease) ([place]) 09/08/2019   ESRD (end stage renal disease) ([place])    HD Tues, Thurs, Sat   Essential hypertension    Mixed hyperlipidemia    Nicotine dependence, cigarettes, uncomplicated    Seizure disorder ([place]) 09/08/2019     FAMILY HISTORY   Family History  Family history unknown: Yes    SOCIAL HISTORY:   Social History   Socioeconomic History   Marital status: Single    Spouse name: Not on file   Number of children: Not on file   Years of education: Not on file   Highest education level: Not on file  Occupational History   Not on file  Tobacco Use   Smoking status: Current Every Day Smoker   Smokeless tobacco: Never Used  Substance and Sexual Activity   Alcohol use: Never   Drug use: Never   Sexual activity: Not on file  Other Topics Concern   Not on file  Social History Narrative   Not on file   Social Determinants of Health   Financial Resource Strain:    Difficulty of Paying Living Expenses:   Food Insecurity:    Worried About Charity fundraiser in the Last Year:    Arboriculturist in the Last Year:   Transportation  Needs:    Film/video editor (Medical):    Lack of Transportation (Non-Medical):   Physical Activity:    Days of Exercise per Week:    Minutes of Exercise per Session:   Stress:    Feeling of Stress :   Social Connections:    Frequency of Communication with Friends and Family:    Frequency of Social Gatherings with Friends and Family:    Attends Religious Services:    Active Member of Clubs or Organizations:    Attends Archivist Meetings:    Marital Status:   Intimate Partner Violence:    Fear of Current or Ex-Partner:    Emotionally Abused:    Physically Abused:    Sexually Abused:     ALLERGIES:    Allergies  Allergen Reactions   Penicillins     Unknown. Can't remember.     CURRENT MEDICATIONS:    Current Facility-Administered Medications  Medication Dose Route Frequency Provider Last Rate Last Admin   acetaminophen (TYLENOL) tablet 650 mg  650 mg Oral Q6H PRN Vernelle Emerald, MD   650 mg at 09/14/19 0940   Or   acetaminophen (TYLENOL) suppository 650 mg  650 mg Rectal Q6H PRN Shalhoub, Sherryll Burger, MD       atorvastatin (LIPITOR) tablet 40  mg  40 mg Oral Daily Shalhoub, Sherryll Burger, MD   40 mg at 09/14/19 0940   budesonide (PULMICORT) nebulizer solution 0.5 mg  0.5 mg Inhalation BID Vernelle Emerald, MD   0.5 mg at 09/14/19 4010   calcitRIOL (ROCALTROL) capsule 0.25 mcg  0.25 mcg Oral Q T,Th,Sat-1800 Rosita Fire, MD   0.25 mcg at 09/13/19 1813   calcium acetate (PHOSLO) capsule 1,334 mg  1,334 mg Oral TID WC Rosita Fire, MD   1,334 mg at 09/14/19 1331   calcium carbonate (TUMS - dosed in mg elemental calcium) chewable tablet 200 mg of elemental calcium  1 tablet Oral BID WC Shelly Coss, MD   200 mg of elemental calcium at 09/14/19 0940   Chlorhexidine Gluconate Cloth 2 % PADS 6 each  6 each Topical Q0600 Vernelle Emerald, MD   6 each at 09/14/19 0940   Chlorhexidine Gluconate Cloth 2 % PADS 6 each  6 each  Topical Q0600 Rosita Fire, MD   6 each at 09/12/19 0543   Chlorhexidine Gluconate Cloth 2 % PADS 6 each  6 each Topical Q0600 Roney Jaffe, MD   6 each at 09/13/19 1211   clonazePAM (KLONOPIN) tablet 1 mg  1 mg Oral QHS Shelly Coss, MD   1 mg at 09/13/19 2244   gabapentin (NEURONTIN) capsule 300 mg  300 mg Oral Daily Shalhoub, Sherryll Burger, MD   300 mg at 09/14/19 0940   heparin injection 5,000 Units  5,000 Units Subcutaneous Q8H Shalhoub, Sherryll Burger, MD   5,000 Units at 09/14/19 1331   hydrALAZINE (APRESOLINE) injection 10 mg  10 mg Intravenous Q6H PRN Shalhoub, Sherryll Burger, MD       ipratropium-albuterol (DUONEB) 0.5-2.5 (3) MG/3ML nebulizer solution 3 mL  3 mL Nebulization Q4H PRN Shalhoub, Sherryll Burger, MD       ondansetron [facility]) tablet 4 mg  4 mg Oral Q6H PRN Shalhoub, Sherryll Burger, MD       Or   ondansetron (ZOFRAN) injection 4 mg  4 mg Intravenous Q6H PRN Shalhoub, Sherryll Burger, MD       oxyCODONE (Oxy IR/ROXICODONE) immediate release tablet 5 mg  5 mg Oral Q12H PRN Darliss Cheney, MD       polyethylene glycol (MIRALAX / GLYCOLAX) packet 17 g  17 g Oral Daily PRN Shalhoub, Sherryll Burger, MD       predniSONE (DELTASONE) tablet 40 mg  40 mg Oral Q breakfast Shelly Coss, MD   40 mg at 09/14/19 2725    REVIEW OF SYSTEMS:   [X]  denotes positive finding, [ ]  denotes negative finding Cardiac  Comments:  Chest pain or chest pressure:    Shortness of breath upon exertion:    Short of breath when lying flat:    Irregular heart rhythm:        Vascular    Pain in calf, thigh, or hip brought on by ambulation:    Pain in feet at night that wakes you up from your sleep:     Blood clot in your veins:    Leg swelling:         Pulmonary    Oxygen at home:    Productive cough:     Wheezing:         Neurologic    Sudden weakness in arms or legs:     Sudden numbness in arms or legs:     Sudden onset of difficulty speaking or slurred speech:  Temporary loss of vision in one eye:       Problems with dizziness:         Gastrointestinal    Blood in stool:      Vomited blood:         Genitourinary    Burning when urinating:     Blood in urine:        Psychiatric    Major depression:         Hematologic    Bleeding problems:    Problems with blood clotting too easily:        Skin    Rashes or ulcers:        Constitutional    Fever or chills:     PHYSICAL EXAM:   Vitals:   09/14/19 0350 09/14/19 0405 09/14/19 0727 09/14/19 0819  BP: 117/77 (!) 94/58  (!) 117/93  Pulse: 93 (!) 57    Resp: (!) 22 14  14   Temp:  (!) 97.3 F (36.3 C)    TempSrc:  Oral    SpO2:  92% 95% 95%  Weight:      Height:        GENERAL: The patient is a well-nourished male, in no acute distress. The vital signs are documented above. CARDIAC: There is a regular rate and rhythm.  VASCULAR: Palpable left brachial and radial pulse PULMONARY: Nonlabored respirations ABDOMEN: Soft and non-tender with normal pitched bowel sounds.  MUSCULOSKELETAL: There are no major deformities or cyanosis. NEUROLOGIC: No focal weakness or paresthesias are detected. SKIN: There are no ulcers or rashes noted. PSYCHIATRIC: The patient has a normal affect.  STUDIES:   I have reviewed his vein mapping with the following findings: +-----------------+-------------+----------+---------+   Right Cephalic   Diameter (cm) Depth (cm) Findings    +-----------------+-------------+----------+---------+   Mid upper arm     0.30      1.49           +-----------------+-------------+----------+---------+   Dist upper arm     0.33      1.70   branching   +-----------------+-------------+----------+---------+   Antecubital fossa   0.26      1.34           +-----------------+-------------+----------+---------+   Prox forearm                  branching   +-----------------+-------------+----------+---------+   +-----------------+-------------+----------+---------+    Right Basilic   Diameter (cm) Depth (cm) Findings    +-----------------+-------------+----------+---------+   Prox upper arm     0.44      0.22           +-----------------+-------------+----------+---------+   Mid upper arm     0.50      0.32           +-----------------+-------------+----------+---------+   Dist upper arm     0.50      0.20   branching   +-----------------+-------------+----------+---------+   Antecubital fossa   0.49      0.21           +-----------------+-------------+----------+---------+   Prox forearm      0.30      0.22   branching   +-----------------+-------------+----------+---------+   Mid forearm      0.30      0.13           +-----------------+-------------+----------+---------+   Wrist         0.16      0.09           +-----------------+-------------+----------+---------+   +-----------------+-------------+----------+--------------+  Left Cephalic   Diameter (cm) Depth (cm)   Findings     +-----------------+-------------+----------+--------------+   Prox upper arm                 not visualized   +-----------------+-------------+----------+--------------+   Mid upper arm                 not visualized   +-----------------+-------------+----------+--------------+   Dist upper arm     0.15      0.16             +-----------------+-------------+----------+--------------+   Antecubital fossa   0.24      0.24             +-----------------+-------------+----------+--------------+   Prox forearm      0.17      0.22             +-----------------+-------------+----------+--------------+   Mid forearm      0.16      0.17             +-----------------+-------------+----------+--------------+   Wrist                     not visualized    +-----------------+-------------+----------+--------------+   +-----------------+-------------+----------+--------------+   Left Basilic    Diameter (cm) Depth (cm)   Findings     +-----------------+-------------+----------+--------------+   Prox upper arm     0.59      0.22     origin     +-----------------+-------------+----------+--------------+   Mid upper arm     0.43      0.36             +-----------------+-------------+----------+--------------+   Dist upper arm     0.47      0.20             +-----------------+-------------+----------+--------------+   Antecubital fossa   0.32      0.26             +-----------------+-------------+----------+--------------+   Prox forearm      0.31      0.24             +-----------------+-------------+----------+--------------+   Mid forearm      0.24      0.10             +-----------------+-------------+----------+--------------+   Distal forearm     0.17      0.16             +-----------------+-------------+----------+--------------+   Wrist                     not visualized   +-----------------+-------------+----------+--------------+  ASSESSMENT and PLAN   I discussed proceeding with a left arm fistula either a brachiocephalic fistula or basilic vein transposition.  We discussed staging a basilic vein fistula.  A graft will be placed if his veins turn out to not be optimal.  I discussed the details of the procedure including the risk of not maturity, the need for future interventions, the risk of steal syndrome, and access failure.  All of his questions were answered.  He has been scheduled for tomorrow.  He will be n.p.o. after midnight.   Leia Alf, MD, FACS Vascular and Vein Specialists of [facility] [phone] Pager [phone]

## 2019-09-14 NOTE — H&P (View-Only) (Signed)
Vascular and Vein Specialist of [facility]  Patient name: Ralph Becker MRN: [id number] DOB: 1960-10-20 Sex: male   REQUESTING PROVIDER:   Renal   REASON FOR CONSULT:    Dialysis access  HISTORY OF PRESENT ILLNESS:   Ralph Becker is a 59 y.o. male, who I have been asked to evaluate for dialysis access.  He is right-handed.  He has a catheter in place.  The patient has a history of COPD.  He is on dialysis Tuesday Thursday Saturday via a right sided catheter.  He is medically managed for hypertension and hyperlipidemia.  He has a history of hepatitis C.  He was admitted status post a seizure.  He was hyperkalemic in the ER with a potassium of 7.7  PAST MEDICAL HISTORY    Past Medical History:  Diagnosis Date  . Chronic hepatitis C ([place])   . COPD (chronic obstructive pulmonary disease) ([place]) 09/08/2019  . ESRD (end stage renal disease) ([place])    HD Tues, Thurs, Sat  . Essential hypertension   . Mixed hyperlipidemia   . Nicotine dependence, cigarettes, uncomplicated   . Seizure disorder ([place]) 09/08/2019     FAMILY HISTORY   Family History  Family history unknown: Yes    SOCIAL HISTORY:   Social History   Socioeconomic History  . Marital status: Single    Spouse name: Not on file  . Number of children: Not on file  . Years of education: Not on file  . Highest education level: Not on file  Occupational History  . Not on file  Tobacco Use  . Smoking status: Current Every Day Smoker  . Smokeless tobacco: Never Used  Substance and Sexual Activity  . Alcohol use: Never  . Drug use: Never  . Sexual activity: Not on file  Other Topics Concern  . Not on file  Social History Narrative  . Not on file   Social Determinants of Health   Financial Resource Strain:   . Difficulty of Paying Living Expenses:   Food Insecurity:   . Worried About Charity fundraiser in the Last Year:   . Arboriculturist in the Last Year:   Transportation  Needs:   . Film/video editor (Medical):   Marland Kitchen Lack of Transportation (Non-Medical):   Physical Activity:   . Days of Exercise per Week:   . Minutes of Exercise per Session:   Stress:   . Feeling of Stress :   Social Connections:   . Frequency of Communication with Friends and Family:   . Frequency of Social Gatherings with Friends and Family:   . Attends Religious Services:   . Active Member of Clubs or Organizations:   . Attends Archivist Meetings:   Marland Kitchen Marital Status:   Intimate Partner Violence:   . Fear of Current or Ex-Partner:   . Emotionally Abused:   Marland Kitchen Physically Abused:   . Sexually Abused:     ALLERGIES:    Allergies  Allergen Reactions  . Penicillins     Unknown. Can't remember.     CURRENT MEDICATIONS:    Current Facility-Administered Medications  Medication Dose Route Frequency Provider Last Rate Last Admin  . acetaminophen (TYLENOL) tablet 650 mg  650 mg Oral Q6H PRN Vernelle Emerald, MD   650 mg at 09/14/19 0940   Or  . acetaminophen (TYLENOL) suppository 650 mg  650 mg Rectal Q6H PRN Shalhoub, Sherryll Burger, MD      . atorvastatin (LIPITOR) tablet 40  mg  40 mg Oral Daily Vernelle Emerald, MD   40 mg at 09/14/19 0940  . budesonide (PULMICORT) nebulizer solution 0.5 mg  0.5 mg Inhalation BID Vernelle Emerald, MD   0.5 mg at 09/14/19 0727  . calcitRIOL (ROCALTROL) capsule 0.25 mcg  0.25 mcg Oral Q T,Th,Sat-1800 Rosita Fire, MD   0.25 mcg at 09/13/19 1813  . calcium acetate (PHOSLO) capsule 1,334 mg  1,334 mg Oral TID WC Rosita Fire, MD   1,334 mg at 09/14/19 1331  . calcium carbonate (TUMS - dosed in mg elemental calcium) chewable tablet 200 mg of elemental calcium  1 tablet Oral BID WC Adhikari, Amrit, MD   200 mg of elemental calcium at 09/14/19 0940  . Chlorhexidine Gluconate Cloth 2 % PADS 6 each  6 each Topical Q0600 Shalhoub, Sherryll Burger, MD   6 each at 09/14/19 0940  . Chlorhexidine Gluconate Cloth 2 % PADS 6 each  6 each  Topical Q0600 Rosita Fire, MD   6 each at 09/12/19 0543  . Chlorhexidine Gluconate Cloth 2 % PADS 6 each  6 each Topical Q0600 Roney Jaffe, MD   6 each at 09/13/19 1211  . clonazePAM (KLONOPIN) tablet 1 mg  1 mg Oral QHS Shelly Coss, MD   1 mg at 09/13/19 2244  . gabapentin (NEURONTIN) capsule 300 mg  300 mg Oral Daily Shalhoub, Sherryll Burger, MD   300 mg at 09/14/19 0940  . heparin injection 5,000 Units  5,000 Units Subcutaneous Q8H Shalhoub, Sherryll Burger, MD   5,000 Units at 09/14/19 1331  . hydrALAZINE (APRESOLINE) injection 10 mg  10 mg Intravenous Q6H PRN Shalhoub, Sherryll Burger, MD      . ipratropium-albuterol (DUONEB) 0.5-2.5 (3) MG/3ML nebulizer solution 3 mL  3 mL Nebulization Q4H PRN Shalhoub, Sherryll Burger, MD      . ondansetron (ZOFRAN) tablet 4 mg  4 mg Oral Q6H PRN Shalhoub, Sherryll Burger, MD       Or  . ondansetron (ZOFRAN) injection 4 mg  4 mg Intravenous Q6H PRN Shalhoub, Sherryll Burger, MD      . oxyCODONE (Oxy IR/ROXICODONE) immediate release tablet 5 mg  5 mg Oral Q12H PRN Darliss Cheney, MD      . polyethylene glycol (MIRALAX / GLYCOLAX) packet 17 g  17 g Oral Daily PRN Shalhoub, Sherryll Burger, MD      . predniSONE (DELTASONE) tablet 40 mg  40 mg Oral Q breakfast Shelly Coss, MD   40 mg at 09/14/19 7591    REVIEW OF SYSTEMS:   [X]  denotes positive finding, [ ]  denotes negative finding Cardiac  Comments:  Chest pain or chest pressure:    Shortness of breath upon exertion:    Short of breath when lying flat:    Irregular heart rhythm:        Vascular    Pain in calf, thigh, or hip brought on by ambulation:    Pain in feet at night that wakes you up from your sleep:     Blood clot in your veins:    Leg swelling:         Pulmonary    Oxygen at home:    Productive cough:     Wheezing:         Neurologic    Sudden weakness in arms or legs:     Sudden numbness in arms or legs:     Sudden onset of difficulty speaking or slurred speech:  Temporary loss of vision in one eye:       Problems with dizziness:         Gastrointestinal    Blood in stool:      Vomited blood:         Genitourinary    Burning when urinating:     Blood in urine:        Psychiatric    Major depression:         Hematologic    Bleeding problems:    Problems with blood clotting too easily:        Skin    Rashes or ulcers:        Constitutional    Fever or chills:     PHYSICAL EXAM:   Vitals:   09/14/19 0350 09/14/19 0405 09/14/19 0727 09/14/19 0819  BP: 117/77 (!) 94/58  (!) 117/93  Pulse: 93 (!) 57    Resp: (!) 22 14  14   Temp:  (!) 97.3 F (36.3 C)    TempSrc:  Oral    SpO2:  92% 95% 95%  Weight:      Height:        GENERAL: The patient is a well-nourished male, in no acute distress. The vital signs are documented above. CARDIAC: There is a regular rate and rhythm.  VASCULAR: Palpable left brachial and radial pulse PULMONARY: Nonlabored respirations ABDOMEN: Soft and non-tender with normal pitched bowel sounds.  MUSCULOSKELETAL: There are no major deformities or cyanosis. NEUROLOGIC: No focal weakness or paresthesias are detected. SKIN: There are no ulcers or rashes noted. PSYCHIATRIC: The patient has a normal affect.  STUDIES:   I have reviewed his vein mapping with the following findings: +-----------------+-------------+----------+---------+  Right Cephalic  Diameter (cm)Depth (cm)Findings   +-----------------+-------------+----------+---------+  Mid upper arm    0.30     1.49         +-----------------+-------------+----------+---------+  Dist upper arm    0.33     1.70  branching  +-----------------+-------------+----------+---------+  Antecubital fossa  0.26     1.34         +-----------------+-------------+----------+---------+  Prox forearm               branching  +-----------------+-------------+----------+---------+   +-----------------+-------------+----------+---------+   Right Basilic  Diameter (cm)Depth (cm)Findings   +-----------------+-------------+----------+---------+  Prox upper arm    0.44     0.22         +-----------------+-------------+----------+---------+  Mid upper arm    0.50     0.32         +-----------------+-------------+----------+---------+  Dist upper arm    0.50     0.20  branching  +-----------------+-------------+----------+---------+  Antecubital fossa  0.49     0.21         +-----------------+-------------+----------+---------+  Prox forearm     0.30     0.22  branching  +-----------------+-------------+----------+---------+  Mid forearm     0.30     0.13         +-----------------+-------------+----------+---------+  Wrist        0.16     0.09         +-----------------+-------------+----------+---------+   +-----------------+-------------+----------+--------------+  Left Cephalic  Diameter (cm)Depth (cm)  Findings    +-----------------+-------------+----------+--------------+  Prox upper arm              not visualized  +-----------------+-------------+----------+--------------+  Mid upper arm              not visualized  +-----------------+-------------+----------+--------------+  Dist upper  arm    0.15     0.16           +-----------------+-------------+----------+--------------+  Antecubital fossa  0.24     0.24           +-----------------+-------------+----------+--------------+  Prox forearm     0.17     0.22           +-----------------+-------------+----------+--------------+  Mid forearm     0.16     0.17           +-----------------+-------------+----------+--------------+  Wrist                  not visualized   +-----------------+-------------+----------+--------------+   +-----------------+-------------+----------+--------------+  Left Basilic   Diameter (cm)Depth (cm)  Findings    +-----------------+-------------+----------+--------------+  Prox upper arm    0.59     0.22    origin    +-----------------+-------------+----------+--------------+  Mid upper arm    0.43     0.36           +-----------------+-------------+----------+--------------+  Dist upper arm    0.47     0.20           +-----------------+-------------+----------+--------------+  Antecubital fossa  0.32     0.26           +-----------------+-------------+----------+--------------+  Prox forearm     0.31     0.24           +-----------------+-------------+----------+--------------+  Mid forearm     0.24     0.10           +-----------------+-------------+----------+--------------+  Distal forearm    0.17     0.16           +-----------------+-------------+----------+--------------+  Wrist                  not visualized  +-----------------+-------------+----------+--------------+  ASSESSMENT and PLAN   I discussed proceeding with a left arm fistula either a brachiocephalic fistula or basilic vein transposition.  We discussed staging a basilic vein fistula.  A graft will be placed if his veins turn out to not be optimal.  I discussed the details of the procedure including the risk of not maturity, the need for future interventions, the risk of steal syndrome, and access failure.  All of his questions were answered.  He has been scheduled for tomorrow.  He will be n.p.o. after midnight.   Leia Alf, MD, FACS Vascular and Vein Specialists of [facility] [phone] Pager [phone]

## 2019-09-14 NOTE — Progress Notes (Signed)
PROGRESS NOTE    Emad Brechtel  UEA:[id number] DOB: [birth year] DOA: 09/08/2019 PCP: Patient, No Pcp Per   Brief Narrative:  Patient is a 59 year old male with history of COPD, end-stage renal disease on dialysis on TTS, hypertension, hyperlipidemia, nicotine dependence, hepatitis C, seizure disorder but not antiepileptics, chronic back pain who presented to the emergency department via EMS brought after suspected episode of seizure.  Patient was unable to contribute any information due to confusion.  He briefly was able to explain that he was sitting in the wheelchair when he experienced started feeling shaky.  As per ED notes, seizure lasted for 1 minute.  Upon further questioning, patient admitted that he missed his dialysis session.On presentation his potassium was 7.7.  Patient was given calcium gluconate, insulin, dextrose and Lokelma.  Nephrology consulted and he underwent emergent dialysis.  Neurology was also consulted regarding suspicion for seizure but not started on antiepileptics because it was suspected to be from uremia.  Hospital course remarkable for new oxygen requirement most likely secondary to his COPD.  Patient evaluated by PT/OT and recommended skilled facility on discharge.  He remains hemodynamically stable.  He is waiting for bed in SNF.  He is medically stable for discharge soon as bed is available.  Assessment & Plan:   Principal Problem:   Hyperkalemia Active Problems:   Mixed hyperlipidemia   Acute metabolic encephalopathy   Hypertensive urgency   Seizure (HCC)   COPD (chronic obstructive pulmonary disease) (HCC)   Hyperkalemia: Presented with potassium of more than 7.  He was given calcium gluconate, insulin, dextrose, Lokelma.    He underwent emergent dialysis.  Now normal.  ESRD on dialysis: Typically dialyzed on TTS is scheduled.  Nephrology following.  Emergent dialysis has been done here.  He said he missed a session of dialysis, he has history of  noncompliance.  Nephrology following here for dialysis.  Patient has been advised for compliance on dialysis.   Acute metabolic encephalopathy: Present on admission.Secondary to uremic encephalopathy.  CT head was unremarkable.  Normal folic acid, vitamin Y78.  Currently he is alert and oriented.  Subclinical hypothyroidism: Elevated TSH in the range of 8.  Normal T4.  Does not indicate Synthroid.    Hypertensive urgency: Presented with severe hypertension most likely secondary to missing dialysis.  Improved after dialysis.  Started home regimen but he became hypotensive and had to be given a bolus of 500cc  on 09/09/2019, so home medications are on hold.  Blood pressure on low normal side despite of holding antihypertensives.  Seizure: As per the EMS report, he had 1 minute of seizure activity. Patient was hospitalized at [facility] in March for seizure but was not sent home on any medications because it was also suspected to be from uremia. Case discussed with neurology.  Since seizure could have been precipitated from uremia, AED not started.  EEG was consistent with toxic/metabolic encephalopathy, no definite epileptiform discharges.    Hyperlipidemia: Continue statin  Acute hypoxic respiratory failure/COPD: Mild wheezing was noted on presentation.  Continue as needed bronchodilator therapy.  Currently on 2-3 L of oxygen. not on oxygen at home.  If he continues to need oxygen, it is okay to send him on oxygen to skilled  nursing facility. Also started on prednisone for wheezes however he has no wheezes today.  We will continue total of 5 days ending on 09/15/2019.  We will try to wean oxygen.  Smoker: Smokes half pack a day.  Counseled for cessation.  Anxiety: Noted to be jittery,having jerking movements,anxious on 09/12/2019.  He has been doing fine now.  Restarted home Klonopin.  Debility/deconditioning: Patient seen by PT/OT and recommended skilled nursing facility on discharge.   Social worker following.            DVT prophylaxis:Heparin [place] Code Status: Full Family Communication: Discussed with patient in detail Status YS:[id number]   Dispo: The patient is from: Home              Anticipated d/c is to:SNF              Anticipated d/c date is: 09/15/2019.              Patient currently is medically stable for discharge.     Consultants: Nephrology, neurology  Procedures:None  Antimicrobials:  Anti-infectives (From admission, onward)   None      Subjective: Seen and examined.  No complaints.  Objective: Vitals:   09/14/19 0350 09/14/19 0405 09/14/19 0727 09/14/19 0819  BP: 117/77 (!) 94/58  (!) 117/93  Pulse: 93 (!) 57    Resp: (!) 22 14  14   Temp:  (!) 97.3 F (36.3 C)    TempSrc:  Oral    SpO2:  92% 95% 95%  Weight:      Height:        Intake/Output Summary (Last 24 hours) at 09/14/2019 1305 Last data filed at 09/14/2019 0405 Gross per 24 hour  Intake --  Output 66 ml  Net -66 ml   Filed Weights   09/09/19 0400 09/11/19 1200 09/11/19 1610  Weight: 74.8 kg 59.1 kg 58.2 kg    Examination:  General exam: Appears calm and comfortable  Respiratory system: Clear to auscultation. Respiratory effort normal. Cardiovascular system: S1 & S2 heard, RRR. No JVD, murmurs, rubs, gallops or clicks. No pedal edema. Gastrointestinal system: Abdomen is nondistended, soft and nontender. No organomegaly or masses felt. Normal bowel sounds heard. Central nervous system: Alert and oriented. No focal neurological deficits. Extremities: Symmetric 5 x 5 power. Skin: No rashes, lesions or ulcers.  Psychiatry: Judgement and insight appear normal. Mood & affect appropriate.   Data Reviewed: I have personally reviewed following labs and imaging studies  CBC: Recent Labs  Lab 09/08/19 1904 09/08/19 1951 09/09/19 0615 09/11/19 1109  WBC 9.8  --  8.6 8.8  NEUTROABS 7.5  --  5.5  --   HGB 10.7* 11.2* 11.1* 10.2*  HCT 35.3* 33.0* 34.7* 33.9*   MCV 92.9  --  90.6 93.4  PLT 277  --  261 932   Basic Metabolic Panel: Recent Labs  Lab 09/09/19 0615 09/09/19 0615 09/10/19 0403 09/11/19 0242 09/12/19 0404 09/13/19 0402 09/14/19 1201  NA 136   < > 137 137 135 137 136  K 4.2   < > 5.3* 5.5* 4.5 5.1 3.5  CL 98   < > 99 97* 98 98 100  CO2 26   < > 26 27 28 27 28   GLUCOSE 89   < > 85 81 107* 106* 110*  BUN 20   < > 34* 47* 26* 43* 22  CREATININE 4.90*   < > 7.09* 8.93* 5.14* 7.13* 4.35*  CALCIUM 8.0*   < > 8.3* 8.6* 8.4* 8.7* 8.2*  MG 1.6*  --  2.4  --   --   --   --   PHOS  --   --  9.3* 11.2* 6.6* 8.1* 3.9   < > = values in this interval  not displayed.   GFR: Estimated Creatinine Clearance: 14.7 mL/min (A) (by C-G formula based on SCr of 4.35 mg/dL (H)). Liver Function Tests: Recent Labs  Lab 09/08/19 2013 09/08/19 2013 09/09/19 0615 09/09/19 0615 09/10/19 0403 09/11/19 0242 09/12/19 0404 09/13/19 0402 09/14/19 1201  AST 15  --  17  --   --   --   --   --   --   ALT 12  --  13  --   --   --   --   --   --   ALKPHOS 100  --  108  --   --   --   --   --   --   BILITOT 0.7  --  0.7  --   --   --   --   --   --   PROT 7.3  --  7.5  --   --   --   --   --   --   ALBUMIN 2.6*   < > 2.5*   < > 2.4* 2.1* 2.1* 2.2* 2.0*   < > = values in this interval not displayed.   No results for input(s): LIPASE, AMYLASE in the last 168 hours. Recent Labs  Lab 09/08/19 0704  AMMONIA 36*   Coagulation Profile: No results for input(s): INR, PROTIME in the last 168 hours. Cardiac Enzymes: No results for input(s): CKTOTAL, CKMB, CKMBINDEX, TROPONINI in the last 168 hours. BNP (last 3 results) No results for input(s): PROBNP in the last 8760 hours. HbA1C: No results for input(s): HGBA1C in the last 72 hours. CBG: Recent Labs  Lab 09/08/19 1947  GLUCAP 86   Lipid Profile: No results for input(s): CHOL, HDL, LDLCALC, TRIG, CHOLHDL, LDLDIRECT in the last 72 hours. Thyroid Function Tests: No results for input(s): TSH,  T4TOTAL, FREET4, T3FREE, THYROIDAB in the last 72 hours. Anemia Panel: No results for input(s): VITAMINB12, FOLATE, FERRITIN, TIBC, IRON, RETICCTPCT in the last 72 hours. Sepsis Labs: Recent Labs  Lab 09/08/19 1945  LATICACIDVEN 1.1    Recent Results (from the past 240 hour(s))  SARS Coronavirus 2 by RT PCR (hospital order, performed in [facility] hospital lab) Nasopharyngeal Nasopharyngeal Swab     Status: None   Collection Time: 09/08/19 10:11 PM   Specimen: Nasopharyngeal Swab  Result Value Ref Range Status   SARS Coronavirus 2 NEGATIVE NEGATIVE Final    Comment: (NOTE) SARS-CoV-2 target nucleic acids are NOT DETECTED.  The SARS-CoV-2 RNA is generally detectable in upper and lower respiratory specimens during the acute phase of infection. The lowest concentration of SARS-CoV-2 viral copies this assay can detect is 250 copies / mL. A negative result does not preclude SARS-CoV-2 infection and should not be used as the sole basis for treatment or other patient management decisions.  A negative result may occur with improper specimen collection / handling, submission of specimen other than nasopharyngeal swab, presence of viral mutation(s) within the areas targeted by this assay, and inadequate number of viral copies (<250 copies / mL). A negative result must be combined with clinical observations, patient history, and epidemiological information.  Fact Sheet for Patients:   StrictlyIdeas.no  Fact Sheet for Healthcare Providers: BankingDealers.co.za  This test is not yet approved or  cleared by the [place] FDA and has been authorized for detection and/or diagnosis of SARS-CoV-2 by FDA under an Emergency Use Authorization (EUA).  This EUA will remain in effect (meaning this test can be used) for the duration  of the COVID-19 declaration under Section 564(b)(1) of the Act, 21 U.S.C. section 360bbb-3(b)(1), unless the  authorization is terminated or revoked sooner.  Performed at [facility], [place] [address] [place], [place] [address]   Urine culture     Status: Abnormal   Collection Time: 09/08/19 10:22 PM   Specimen: Urine, Random  Result Value Ref Range Status   Specimen Description URINE, RANDOM  Final   Special Requests NONE  Final   Culture (A)  Final    <10,000 COLONIES/mL INSIGNIFICANT GROWTH Performed at [facility], [place] [address] [place], [place] [address]    Report Status 09/10/2019 FINAL  Final     Radiology Studies: No results found.   Scheduled Meds: . atorvastatin  40 mg Oral Daily  . budesonide  0.5 mg Inhalation BID  . calcitRIOL  0.25 mcg Oral Q T,Th,Sat-1800  . calcium acetate  1,334 mg Oral TID WC  . calcium carbonate  1 tablet Oral BID WC  . Chlorhexidine Gluconate Cloth  6 each Topical Q0600  . Chlorhexidine Gluconate Cloth  6 each Topical Q0600  . Chlorhexidine Gluconate Cloth  6 each Topical Q0600  . clonazePAM  1 mg Oral QHS  . gabapentin  300 mg Oral Daily  . heparin  5,000 Units Subcutaneous Q8H  . predniSONE  40 mg Oral Q breakfast   Continuous Infusions:   LOS: 5 days    Total time spent: 26 minutes  Darliss Cheney, MD [facility] P7/01/2020, 1:05 PM

## 2019-09-14 NOTE — Progress Notes (Signed)
[facility] Progress Note  Subjective: seen in room, no new c/o's  Vitals:   09/14/19 0350 09/14/19 0405 09/14/19 0727 09/14/19 0819  BP: 117/77 (!) 94/58  (!) 117/93  Pulse: 93 (!) 57    Resp: (!) 22 14  14   Temp:  (!) 97.3 F (36.3 C)    TempSrc:  Oral    SpO2:  92% 95% 95%  Weight:      Height:        Exam: NAD nasal O2 RRR no RG Chest cta bilat  Abd soft ntnd  Ext no edema  Nonfocal, Ox 3   TDC in chest    OP HD: TTS [facility]  4h  60kg  2/2.25 bath  TDC  Hep 4000   Calc 0.25 tiw   venofer 100 x 10   Assessment/ Plan: 1. Severe hyperkalemia: Due to missed dialysis treatment.  Required urgent dialysis on 7/5 and then had HD yest again. Resolved.  2. SOB/ hypoxemic resp failure: no edema on exam and CXR unremarkable. Getting bronchodilators per primary team. Under dry wt by 2kg. Resolved 3. ESRD: HD TTS. Had HD overnight. Next HD Tuesday.  4. HD access: will ask [facility] to see for possible permanent access 5. Anemia of ESRD: Hemoglobin at goal.  Monitor. 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  Started calcium acetate.   7. HTN/volume: Blood pressure and volume status acceptable.  Continue current antihypertensives.  Monitor BP. Under dry wt by 2kg, no edema on exam.  8. Seizure disorder: CT head with no acute finding.  Per primary team. 9. Dispo - SNF placement in process     Ralph Becker 09/14/2019, 3:09 PM   Recent Labs  Lab 09/09/19 0615 09/10/19 0403 09/11/19 1109 09/12/19 0404 09/13/19 0402 09/14/19 1201  K 4.2   < >  --    < > 5.1 3.5  BUN 20   < >  --    < > 43* 22  CREATININE 4.90*   < >  --    < > 7.13* 4.35*  CALCIUM 8.0*   < >  --    < > 8.7* 8.2*  PHOS  --    < >  --    < > 8.1* 3.9  HGB 11.1*  --  10.2*  --   --   --    < > = values in this interval not displayed.   Inpatient medications: . atorvastatin  40 mg Oral Daily  . budesonide  0.5 mg Inhalation BID  . calcitRIOL  0.25 mcg Oral Q T,Th,Sat-1800  . calcium acetate   1,334 mg Oral TID WC  . calcium carbonate  1 tablet Oral BID WC  . Chlorhexidine Gluconate Cloth  6 each Topical Q0600  . Chlorhexidine Gluconate Cloth  6 each Topical Q0600  . Chlorhexidine Gluconate Cloth  6 each Topical Q0600  . clonazePAM  1 mg Oral QHS  . gabapentin  300 mg Oral Daily  . heparin  5,000 Units Subcutaneous Q8H  . predniSONE  40 mg Oral Q breakfast    acetaminophen **OR** acetaminophen, hydrALAZINE, ipratropium-albuterol, ondansetron **OR** ondansetron (ZOFRAN) IV, oxyCODONE, polyethylene glycol

## 2019-09-15 ENCOUNTER — Inpatient Hospital Stay (HOSPITAL_COMMUNITY): Payer: Medicaid Other | Admitting: Certified Registered Nurse Anesthetist

## 2019-09-15 ENCOUNTER — Encounter (HOSPITAL_COMMUNITY)
Admission: EM | Disposition: A | Discharge status: Home / Self Care | Payer: Self-pay | Source: Home / Self Care | Attending: Family Medicine

## 2019-09-15 ENCOUNTER — Encounter (HOSPITAL_COMMUNITY): Payer: Self-pay | Admitting: Internal Medicine

## 2019-09-15 HISTORY — PX: AV FISTULA PLACEMENT: SHX1204

## 2019-09-15 LAB — CBC
HCT: 29 % — ABNORMAL LOW (ref 39.0–52.0)
Hemoglobin: 8.8 g/dL — ABNORMAL LOW (ref 13.0–17.0)
MCH: 28.6 pg (ref 26.0–34.0)
MCHC: 30.3 g/dL (ref 30.0–36.0)
MCV: 94.2 fL (ref 80.0–100.0)
Platelets: 198 10*3/uL (ref 150–400)
RBC: 3.08 MIL/uL — ABNORMAL LOW (ref 4.22–5.81)
RDW: 17.3 % — ABNORMAL HIGH (ref 11.5–15.5)
WBC: 10.6 10*3/uL — ABNORMAL HIGH (ref 4.0–10.5)
nRBC: 0 % (ref 0.0–0.2)

## 2019-09-15 LAB — RENAL FUNCTION PANEL
Albumin: 2 g/dL — ABNORMAL LOW (ref 3.5–5.0)
Anion gap: 11 (ref 5–15)
BUN: 37 mg/dL — ABNORMAL HIGH (ref 8–23)
CO2: 23 mmol/L (ref 22–32)
Calcium: 8.7 mg/dL — ABNORMAL LOW (ref 8.9–10.3)
Chloride: 101 mmol/L (ref 98–111)
Creatinine, Ser: 5.68 mg/dL — ABNORMAL HIGH (ref 0.61–1.24)
GFR calc Af Amer: 11 mL/min — ABNORMAL LOW (ref >60)
GFR calc non Af Amer: 10 mL/min — ABNORMAL LOW (ref >60)
Glucose, Bld: 85 mg/dL (ref 70–99)
Phosphorus: 4.4 mg/dL (ref 2.5–4.6)
Potassium: 3.9 mmol/L (ref 3.5–5.1)
Sodium: 135 mmol/L (ref 135–145)

## 2019-09-15 LAB — PROTIME-INR
INR: 1.1 (ref 0.8–1.2)
Prothrombin Time: 13.4 seconds (ref 11.4–15.2)

## 2019-09-15 SURGERY — ARTERIOVENOUS (AV) FISTULA CREATION
Anesthesia: General | Site: Arm Upper | Laterality: Left

## 2019-09-15 MED ORDER — LIDOCAINE 2% (20 MG/ML) 5 ML SYRINGE
INTRAMUSCULAR | Status: DC | PRN
  Administered 2019-09-15: 100 mg via INTRAVENOUS

## 2019-09-15 MED ORDER — MIDAZOLAM HCL 5 MG/5ML IJ SOLN
INTRAMUSCULAR | Status: DC | PRN
  Administered 2019-09-15: 2 mg via INTRAVENOUS

## 2019-09-15 MED ORDER — FENTANYL CITRATE (PF) 100 MCG/2ML IJ SOLN
INTRAMUSCULAR | Status: DC | PRN
  Administered 2019-09-15 (×3): 50 ug via INTRAVENOUS

## 2019-09-15 MED ORDER — CHLORHEXIDINE GLUCONATE 0.12 % MT SOLN: 15.0000 mL | Freq: Once | OROMUCOSAL | Status: AC

## 2019-09-15 MED ORDER — 0.9 % SODIUM CHLORIDE (POUR BTL) OPTIME
TOPICAL | Status: DC | PRN
  Administered 2019-09-15: 1000 mL

## 2019-09-15 MED ORDER — PHENYLEPHRINE 40 MCG/ML (10ML) SYRINGE FOR IV PUSH (FOR BLOOD PRESSURE SUPPORT)
PREFILLED_SYRINGE | INTRAVENOUS | Status: DC | PRN
  Administered 2019-09-15 (×2): 120 ug via INTRAVENOUS
  Administered 2019-09-15: 80 ug via INTRAVENOUS

## 2019-09-15 MED ORDER — PHENYLEPHRINE HCL-NACL 10-0.9 MG/250ML-% IV SOLN
INTRAVENOUS | Status: DC | PRN
  Administered 2019-09-15: 25 ug/min via INTRAVENOUS

## 2019-09-15 MED ORDER — SODIUM CHLORIDE 0.9 % IV SOLN
INTRAVENOUS | Status: DC | PRN
  Administered 2019-09-15: 500 mL

## 2019-09-15 MED ORDER — CHLORHEXIDINE GLUCONATE CLOTH 2 % EX PADS
6.0000 | MEDICATED_PAD | Freq: Every day | CUTANEOUS | Status: DC
  Administered 2019-09-16: 6 via TOPICAL

## 2019-09-15 MED ORDER — VANCOMYCIN HCL IN DEXTROSE 1-5 GM/200ML-% IV SOLN
INTRAVENOUS | Status: AC
  Filled 2019-09-15: qty 200

## 2019-09-15 MED ORDER — CHLORHEXIDINE GLUCONATE 0.12 % MT SOLN
OROMUCOSAL | Status: AC
  Administered 2019-09-15: 15 mL via OROMUCOSAL
  Filled 2019-09-15: qty 15

## 2019-09-15 MED ORDER — PROPOFOL 10 MG/ML IV BOLUS
INTRAVENOUS | Status: DC | PRN
  Administered 2019-09-15: 200 mg via INTRAVENOUS
  Administered 2019-09-15: 50 mg via INTRAVENOUS
  Administered 2019-09-15: 30 mg via INTRAVENOUS

## 2019-09-15 MED ORDER — EPHEDRINE 5 MG/ML INJ
INTRAVENOUS | Status: AC
  Filled 2019-09-15: qty 10

## 2019-09-15 MED ORDER — SODIUM CHLORIDE 0.9 % IV SOLN: INTRAVENOUS | Status: DC

## 2019-09-15 MED ORDER — EPHEDRINE SULFATE-NACL 50-0.9 MG/10ML-% IV SOSY
PREFILLED_SYRINGE | INTRAVENOUS | Status: DC | PRN
  Administered 2019-09-15 (×2): 10 mg via INTRAVENOUS

## 2019-09-15 MED ORDER — ONDANSETRON HCL 4 MG/2ML IJ SOLN
INTRAMUSCULAR | Status: AC
  Filled 2019-09-15: qty 2

## 2019-09-15 MED ORDER — FENTANYL CITRATE (PF) 250 MCG/5ML IJ SOLN
INTRAMUSCULAR | Status: AC
  Filled 2019-09-15: qty 5

## 2019-09-15 MED ORDER — LIDOCAINE-EPINEPHRINE 0.5 %-1:200000 IJ SOLN
INTRAMUSCULAR | Status: AC
  Filled 2019-09-15: qty 1

## 2019-09-15 MED ORDER — VANCOMYCIN HCL 1000 MG IV SOLR
INTRAVENOUS | Status: DC | PRN
  Administered 2019-09-15: 1000 mg via INTRAVENOUS

## 2019-09-15 MED ORDER — PHENYLEPHRINE 40 MCG/ML (10ML) SYRINGE FOR IV PUSH (FOR BLOOD PRESSURE SUPPORT)
PREFILLED_SYRINGE | INTRAVENOUS | Status: AC
  Filled 2019-09-15: qty 10

## 2019-09-15 MED ORDER — PROTAMINE SULFATE 10 MG/ML IV SOLN
INTRAVENOUS | Status: AC
  Filled 2019-09-15: qty 5

## 2019-09-15 MED ORDER — MIDAZOLAM HCL 2 MG/2ML IJ SOLN
INTRAMUSCULAR | Status: AC
  Filled 2019-09-15: qty 2

## 2019-09-15 MED ORDER — ONDANSETRON HCL 4 MG/2ML IJ SOLN
INTRAMUSCULAR | Status: DC | PRN
  Administered 2019-09-15: 4 mg via INTRAVENOUS

## 2019-09-15 MED ORDER — SODIUM CHLORIDE 0.9 % IV SOLN
INTRAVENOUS | Status: AC
  Filled 2019-09-15: qty 1.2

## 2019-09-15 MED ORDER — PROPOFOL 10 MG/ML IV BOLUS
INTRAVENOUS | Status: AC
  Filled 2019-09-15: qty 20

## 2019-09-15 SURGICAL SUPPLY — 34 items
ARMBAND PINK RESTRICT EXTREMIT (MISCELLANEOUS) ×6 IMPLANT
CANISTER SUCT 3000ML PPV (MISCELLANEOUS) ×3 IMPLANT
CANNULA VESSEL 3MM 2 BLNT TIP (CANNULA) ×3 IMPLANT
CLIP LIGATING EXTRA MED SLVR (CLIP) ×3 IMPLANT
CLIP LIGATING EXTRA SM BLUE (MISCELLANEOUS) ×3 IMPLANT
COVER PROBE W GEL 5X96 (DRAPES) ×3 IMPLANT
COVER WAND RF STERILE (DRAPES) ×1 IMPLANT
DECANTER SPIKE VIAL GLASS SM (MISCELLANEOUS) ×1 IMPLANT
DERMABOND ADVANCED (GAUZE/BANDAGES/DRESSINGS) ×2
DERMABOND ADVANCED .7 DNX12 (GAUZE/BANDAGES/DRESSINGS) ×1 IMPLANT
ELECT REM PT RETURN 9FT ADLT (ELECTROSURGICAL) ×3
ELECTRODE REM PT RTRN 9FT ADLT (ELECTROSURGICAL) ×1 IMPLANT
GLOVE BIOGEL PI IND STRL 6.5 (GLOVE) IMPLANT
GLOVE BIOGEL PI IND STRL 7.0 (GLOVE) IMPLANT
GLOVE BIOGEL PI INDICATOR 6.5 (GLOVE) ×2
GLOVE BIOGEL PI INDICATOR 7.0 (GLOVE) ×2
GLOVE ECLIPSE 6.5 STRL STRAW (GLOVE) ×2 IMPLANT
GLOVE SS BIOGEL STRL SZ 7.5 (GLOVE) ×1 IMPLANT
GLOVE SUPERSENSE BIOGEL SZ 7.5 (GLOVE) ×2
GLOVE SURG SS PI 6.5 STRL IVOR (GLOVE) ×4 IMPLANT
GOWN STRL NON-REIN LRG LVL3 (GOWN DISPOSABLE) ×2 IMPLANT
GOWN STRL REUS W/ TWL LRG LVL3 (GOWN DISPOSABLE) ×3 IMPLANT
GOWN STRL REUS W/TWL LRG LVL3 (GOWN DISPOSABLE) ×4
KIT BASIN OR (CUSTOM PROCEDURE TRAY) ×3 IMPLANT
KIT TURNOVER KIT B (KITS) ×3 IMPLANT
NS IRRIG 1000ML POUR BTL (IV SOLUTION) ×3 IMPLANT
PACK CV ACCESS (CUSTOM PROCEDURE TRAY) ×3 IMPLANT
PAD ARMBOARD 7.5X6 YLW CONV (MISCELLANEOUS) ×6 IMPLANT
SUT PROLENE 6 0 CC (SUTURE) ×3 IMPLANT
SUT VIC AB 3-0 SH 27 (SUTURE) ×2
SUT VIC AB 3-0 SH 27X BRD (SUTURE) ×1 IMPLANT
TOWEL GREEN STERILE (TOWEL DISPOSABLE) ×3 IMPLANT
UNDERPAD 30X36 HEAVY ABSORB (UNDERPADS AND DIAPERS) ×3 IMPLANT
WATER STERILE IRR 1000ML POUR (IV SOLUTION) ×3 IMPLANT

## 2019-09-15 NOTE — Discharge Instructions (Signed)
° °  [facility] ° °Discharge Instructions ° °AV Fistula or Graft Surgery for Dialysis Access ° °Please refer to the following instructions for your post-procedure care. Your surgeon or physician assistant will discuss any changes with you. ° °Activity ° °You may drive the day following your surgery, if you are comfortable and no longer taking prescription pain medication. Resume full activity as the soreness in your incision resolves. ° °Bathing/Showering ° °You may shower after you go home. Keep your incision dry for 48 hours. Do not soak in a bathtub, hot tub, or swim until the incision heals completely. You may not shower if you have a hemodialysis catheter. ° °Incision Care ° °Clean your incision with mild soap and water after 48 hours. Pat the area dry with a clean towel. You do not need a bandage unless otherwise instructed. Do not apply any ointments or creams to your incision. You may have skin glue on your incision. Do not peel it off. It will come off on its own in about one week. Your arm may swell a bit after surgery. To reduce swelling use pillows to elevate your arm so it is above your heart. Your doctor will tell you if you need to lightly wrap your arm with an ACE bandage. ° °Diet ° °Resume your normal diet. There are not special food restrictions following this procedure. In order to heal from your surgery, it is CRITICAL to get adequate nutrition. Your body requires vitamins, minerals, and protein. Vegetables are the best source of vitamins and minerals. Vegetables also provide the perfect balance of protein. Processed food has little nutritional value, so try to avoid this. ° °Medications ° °Resume taking all of your medications. If your incision is causing pain, you may take over-the counter pain relievers such as acetaminophen (Tylenol). If you were prescribed a stronger pain medication, please be aware these medications can cause nausea and constipation. Prevent  nausea by taking the medication with a snack or meal. Avoid constipation by drinking plenty of fluids and eating foods with high amount of fiber, such as fruits, vegetables, and grains. Do not take Tylenol if you are taking prescription pain medications. ° ° ° ° °Follow up °Your surgeon may want to see you in the office following your access surgery. If so, this will be arranged at the time of your surgery. ° °Please call us immediately for any of the following conditions: ° °Increased pain, redness, drainage (pus) from your incision site °Fever of 101 degrees or higher °Severe or worsening pain at your incision site °Hand pain or numbness. ° °Reduce your risk of vascular disease: ° °Stop smoking. If you would like help, call [facility] at 1-800-QUIT-NOW (1-800-784-8669) or [facility] at [phone] ° °Manage your cholesterol °Maintain a desired weight °Control your diabetes °Keep your blood pressure down ° °Dialysis ° °It will take several weeks to several months for your new dialysis access to be ready for use. Your surgeon will determine when it is OK to use it. Your nephrologist will continue to direct your dialysis. You can continue to use your Permcath until your new access is ready for use. ° °If you have any questions, please call the office at [phone]. ° °

## 2019-09-15 NOTE — Interval H&P Note (Signed)
History and Physical Interval Note:  09/15/2019 11:47 AM  Ralph Becker  has presented today for surgery, with the diagnosis of Dialysis.  The various methods of treatment have been discussed with the patient and family. After consideration of risks, benefits and other options for treatment, the patient has consented to  Procedure(s): ARTERIOVENOUS (AV) FISTULA CREATION VS. GRAFT (Left) as a surgical intervention.  The patient's history has been reviewed, patient examined, no change in status, stable for surgery.  I have reviewed the patient's chart and labs.  Questions were answered to the patient's satisfaction.     Curt Jews

## 2019-09-15 NOTE — Progress Notes (Signed)
Consent obtained for left arm fistula vs graft procedure schedule for today. Pt remains NPO, understands procedure and does not have questions at this time.

## 2019-09-15 NOTE — Progress Notes (Signed)
PROGRESS NOTE    Ralph Becker  QVZ:[id number] DOB: Oct 30, 1960 DOA: 09/08/2019 PCP: Patient, No Pcp Per   Brief Narrative:  Patient is a 59 year old male with history of COPD, end-stage renal disease on dialysis on TTS, hypertension, hyperlipidemia, nicotine dependence, hepatitis C, seizure disorder but not antiepileptics, chronic back pain who presented to the emergency department via EMS brought after suspected episode of seizure.  Patient was unable to contribute any information due to confusion.  He briefly was able to explain that he was sitting in the wheelchair when he experienced started feeling shaky.  As per ED notes, seizure lasted for 1 minute.  Upon further questioning, patient admitted that he missed his dialysis session.On presentation his potassium was 7.7.  Patient was given calcium gluconate, insulin, dextrose and Lokelma.  Nephrology consulted and he underwent emergent dialysis.  Neurology was also consulted regarding suspicion for seizure but not started on antiepileptics because it was suspected to be from uremia.  Hospital course remarkable for new oxygen requirement most likely secondary to his COPD.  Patient evaluated by PT/OT and recommended skilled facility on discharge.  He remains hemodynamically stable.  He is waiting for bed in SNF.  He is medically stable for discharge soon as bed is available.  Assessment & Plan:   Principal Problem:   Hyperkalemia Active Problems:   Mixed hyperlipidemia   Acute metabolic encephalopathy   Hypertensive urgency   Seizure (HCC)   COPD (chronic obstructive pulmonary disease) (HCC)   Hyperkalemia: Presented with potassium of more than 7.  He was given calcium gluconate, insulin, dextrose, Lokelma.    He underwent emergent dialysis.  Now normal.  ESRD on dialysis: Typically dialyzed on TTS is scheduled.  Nephrology following.  Emergent dialysis has been done here.  He said he missed a session of dialysis, he has history of  noncompliance.  Nephrology following here for dialysis.  Patient has been advised for compliance on dialysis.  Vascular surgery has been consulted and patient is scheduled to have AV fistula done today.  Acute metabolic encephalopathy: Present on admission.Secondary to uremic encephalopathy.  CT head was unremarkable.  Normal folic acid, vitamin P29.  Currently he is alert and oriented.  Subclinical hypothyroidism: Elevated TSH in the range of 8.  Normal T4.  Does not indicate Synthroid.    Hypertensive urgency: Presented with severe hypertension most likely secondary to missing dialysis.  Improved after dialysis.  Started home regimen but he became hypotensive and had to be given a bolus of 500cc  on 09/09/2019, so home medications are on hold.  Blood pressure on low normal side despite of holding antihypertensives.  Seizure: As per the EMS report, he had 1 minute of seizure activity. Patient was hospitalized at [facility] in March for seizure but was not sent home on any medications because it was also suspected to be from uremia. Case discussed with neurology.  Since seizure could have been precipitated from uremia, AED not started.  EEG was consistent with toxic/metabolic encephalopathy, no definite epileptiform discharges.    Hyperlipidemia: Continue statin  Acute hypoxic respiratory failure/COPD: Mild wheezing was noted on presentation.  Continue as needed bronchodilator therapy.  He is now on room air.  He was started on prednisone for wheezes.  He has no wheezes since last 3 days.  Today will be day 5 for his prednisone.  We will stop that.  Smoker: Smokes half pack a day.  Counseled for cessation.  Anxiety: Noted to be jittery,having jerking movements,anxious on  09/12/2019.  He has been doing fine now.  Restarted home Klonopin.  Debility/deconditioning: Patient seen by PT/OT and recommended skilled nursing facility on discharge.  Social worker following.          DVT  prophylaxis:Heparin Marshall Code Status: Full Family Communication: Discussed with patient in detail Status HW:[id number]   Dispo: The patient is from: Home              Anticipated d/c is to:SNF              Anticipated d/c date is: 09/16/2019.  Waiting for bed.              Patient currently is medically stable for discharge.     Consultants: Nephrology, neurology  Procedures:None  Antimicrobials:  Anti-infectives (From admission, onward)   Start     Dose/Rate Route Frequency Ordered Stop   09/15/19 1159  vancomycin (VANCOCIN) 1-5 GM/200ML-% IVPB       Note to Pharmacy: Grace Blight   : cabinet override      09/15/19 1159 09/16/19 0014      Subjective: Seen and examined.  He has no complaints.  Objective: Vitals:   09/15/19 0600 09/15/19 0742 09/15/19 0757 09/15/19 1128  BP:  121/90    Pulse:  63    Resp:  15    Temp:  98.6 F (37 C)    TempSrc:  Oral    SpO2:  99% 100%   Weight: 60.4 kg     Height:    6' 0.01" (1.829 m)    Intake/Output Summary (Last 24 hours) at 09/15/2019 1303 Last data filed at 09/15/2019 1237 Gross per 24 hour  Intake 200 ml  Output --  Net 200 ml   Filed Weights   09/11/19 1200 09/11/19 1610 09/15/19 0600  Weight: 59.1 kg 58.2 kg 60.4 kg    Examination:  General exam: Appears calm and comfortable  Respiratory system: Clear to auscultation. Respiratory effort normal. Cardiovascular system: S1 & S2 heard, RRR. No JVD, murmurs, rubs, gallops or clicks. No pedal edema. Gastrointestinal system: Abdomen is nondistended, soft and nontender. No organomegaly or masses felt. Normal bowel sounds heard. Central nervous system: Alert and oriented. No focal neurological deficits. Extremities: Symmetric 5 x 5 power. Skin: No rashes, lesions or ulcers.  Psychiatry: Judgement and insight appear normal. Mood & affect appropriate.   Data Reviewed: I have personally reviewed following labs and imaging studies  CBC: Recent Labs  Lab 09/08/19 1904  09/08/19 1951 09/09/19 0615 09/11/19 1109 09/15/19 0358  WBC 9.8  --  8.6 8.8 10.6*  NEUTROABS 7.5  --  5.5  --   --   HGB 10.7* 11.2* 11.1* 10.2* 8.8*  HCT 35.3* 33.0* 34.7* 33.9* 29.0*  MCV 92.9  --  90.6 93.4 94.2  PLT 277  --  261 214 491   Basic Metabolic Panel: Recent Labs  Lab 09/09/19 0615 09/09/19 0615 09/10/19 0403 09/10/19 0403 09/11/19 0242 09/12/19 0404 09/13/19 0402 09/14/19 1201 09/15/19 0358  NA 136   < > 137   < > 137 135 137 136 135  K 4.2   < > 5.3*   < > 5.5* 4.5 5.1 3.5 3.9  CL 98   < > 99   < > 97* 98 98 100 101  CO2 26   < > 26   < > 27 28 27 28 23   GLUCOSE 89   < > 85   < > 81 107* 106* 110*  85  BUN 20   < > 34*   < > 47* 26* 43* 22 37*  CREATININE 4.90*   < > 7.09*   < > 8.93* 5.14* 7.13* 4.35* 5.68*  CALCIUM 8.0*   < > 8.3*   < > 8.6* 8.4* 8.7* 8.2* 8.7*  MG 1.6*  --  2.4  --   --   --   --   --   --   PHOS  --   --  9.3*   < > 11.2* 6.6* 8.1* 3.9 4.4   < > = values in this interval not displayed.   GFR: Estimated Creatinine Clearance: 11.7 mL/min (A) (by C-G formula based on SCr of 5.68 mg/dL (H)). Liver Function Tests: Recent Labs  Lab 09/08/19 2013 09/08/19 2013 09/09/19 0615 09/10/19 0403 09/11/19 0242 09/12/19 0404 09/13/19 0402 09/14/19 1201 09/15/19 0358  AST 15  --  17  --   --   --   --   --   --   ALT 12  --  13  --   --   --   --   --   --   ALKPHOS 100  --  108  --   --   --   --   --   --   BILITOT 0.7  --  0.7  --   --   --   --   --   --   PROT 7.3  --  7.5  --   --   --   --   --   --   ALBUMIN 2.6*   < > 2.5*   < > 2.1* 2.1* 2.2* 2.0* 2.0*   < > = values in this interval not displayed.   No results for input(s): LIPASE, AMYLASE in the last 168 hours. No results for input(s): AMMONIA in the last 168 hours. Coagulation Profile: Recent Labs  Lab 09/15/19 0358  INR 1.1   Cardiac Enzymes: No results for input(s): CKTOTAL, CKMB, CKMBINDEX, TROPONINI in the last 168 hours. BNP (last 3 results) No results for  input(s): PROBNP in the last 8760 hours. HbA1C: No results for input(s): HGBA1C in the last 72 hours. CBG: Recent Labs  Lab 09/08/19 1947  GLUCAP 86   Lipid Profile: No results for input(s): CHOL, HDL, LDLCALC, TRIG, CHOLHDL, LDLDIRECT in the last 72 hours. Thyroid Function Tests: No results for input(s): TSH, T4TOTAL, FREET4, T3FREE, THYROIDAB in the last 72 hours. Anemia Panel: No results for input(s): VITAMINB12, FOLATE, FERRITIN, TIBC, IRON, RETICCTPCT in the last 72 hours. Sepsis Labs: Recent Labs  Lab 09/08/19 1945  LATICACIDVEN 1.1    Recent Results (from the past 240 hour(s))  SARS Coronavirus 2 by RT PCR (hospital order, performed in [facility] hospital lab) Nasopharyngeal Nasopharyngeal Swab     Status: None   Collection Time: 09/08/19 10:11 PM   Specimen: Nasopharyngeal Swab  Result Value Ref Range Status   SARS Coronavirus 2 NEGATIVE NEGATIVE Final    Comment: (NOTE) SARS-CoV-2 target nucleic acids are NOT DETECTED.  The SARS-CoV-2 RNA is generally detectable in upper and lower respiratory specimens during the acute phase of infection. The lowest concentration of SARS-CoV-2 viral copies this assay can detect is 250 copies / mL. A negative result does not preclude SARS-CoV-2 infection and should not be used as the sole basis for treatment or other patient management decisions.  A negative result may occur with improper specimen collection / handling, submission of specimen  other than nasopharyngeal swab, presence of viral mutation(s) within the areas targeted by this assay, and inadequate number of viral copies (<250 copies / mL). A negative result must be combined with clinical observations, patient history, and epidemiological information.  Fact Sheet for Patients:   StrictlyIdeas.no  Fact Sheet for Healthcare Providers: BankingDealers.co.za  This test is not yet approved or  cleared by the [place] FDA  and has been authorized for detection and/or diagnosis of SARS-CoV-2 by FDA under an Emergency Use Authorization (EUA).  This EUA will remain in effect (meaning this test can be used) for the duration of the COVID-19 declaration under Section 564(b)(1) of the Act, 21 U.S.C. section 360bbb-3(b)(1), unless the authorization is terminated or revoked sooner.  Performed at [facility], [place] [address] [place], [place] [address]   Urine culture     Status: Abnormal   Collection Time: 09/08/19 10:22 PM   Specimen: Urine, Random  Result Value Ref Range Status   Specimen Description URINE, RANDOM  Final   Special Requests NONE  Final   Culture (A)  Final    <10,000 COLONIES/mL INSIGNIFICANT GROWTH Performed at [facility], [place] [address], [place], [place] [address]    Report Status 09/10/2019 FINAL  Final     Radiology Studies: VAS [place] UPPER EXT VEIN MAPPING (PRE-OP AVF)  Result Date: 09/15/2019 UPPER EXTREMITY VEIN MAPPING  Indications: Pre-dialysis access. Limitations: Body habitus. IV, right forearm. Comparison Study: No prior study on file Performing Technologist: Sharion Dove RVS  Examination Guidelines: A complete evaluation includes B-mode imaging, spectral Doppler, color Doppler, and power Doppler as needed of all accessible portions of each vessel. Bilateral testing is considered an integral part of a complete examination. Limited examinations for reoccurring indications may be performed as noted. +-----------------+-------------+----------+---------+ Right Cephalic   Diameter (cm)Depth (cm)Findings  +-----------------+-------------+----------+---------+ Mid upper arm        0.30        1.49             +-----------------+-------------+----------+---------+ Dist upper arm       0.33        1.70   branching +-----------------+-------------+----------+---------+ Antecubital fossa    0.26        1.34              +-----------------+-------------+----------+---------+ Prox forearm                            branching +-----------------+-------------+----------+---------+ +-----------------+-------------+----------+---------+ Right Basilic    Diameter (cm)Depth (cm)Findings  +-----------------+-------------+----------+---------+ Prox upper arm       0.44        0.22             +-----------------+-------------+----------+---------+ Mid upper arm        0.50        0.32             +-----------------+-------------+----------+---------+ Dist upper arm       0.50        0.20   branching +-----------------+-------------+----------+---------+ Antecubital fossa    0.49        0.21             +-----------------+-------------+----------+---------+ Prox forearm         0.30        0.22   branching +-----------------+-------------+----------+---------+ Mid forearm          0.30        0.13             +-----------------+-------------+----------+---------+  Wrist                0.16        0.09             +-----------------+-------------+----------+---------+ +-----------------+-------------+----------+--------------+ Left Cephalic    Diameter (cm)Depth (cm)   Findings    +-----------------+-------------+----------+--------------+ Prox upper arm                          not visualized +-----------------+-------------+----------+--------------+ Mid upper arm                           not visualized +-----------------+-------------+----------+--------------+ Dist upper arm       0.15        0.16                  +-----------------+-------------+----------+--------------+ Antecubital fossa    0.24        0.24                  +-----------------+-------------+----------+--------------+ Prox forearm         0.17        0.22                  +-----------------+-------------+----------+--------------+ Mid forearm          0.16        0.17                   +-----------------+-------------+----------+--------------+ Wrist                                   not visualized +-----------------+-------------+----------+--------------+ +-----------------+-------------+----------+--------------+ Left Basilic     Diameter (cm)Depth (cm)   Findings    +-----------------+-------------+----------+--------------+ Prox upper arm       0.59        0.22       origin     +-----------------+-------------+----------+--------------+ Mid upper arm        0.43        0.36                  +-----------------+-------------+----------+--------------+ Dist upper arm       0.47        0.20                  +-----------------+-------------+----------+--------------+ Antecubital fossa    0.32        0.26                  +-----------------+-------------+----------+--------------+ Prox forearm         0.31        0.24                  +-----------------+-------------+----------+--------------+ Mid forearm          0.24        0.10                  +-----------------+-------------+----------+--------------+ Distal forearm       0.17        0.16                  +-----------------+-------------+----------+--------------+ Wrist                                   not visualized +-----------------+-------------+----------+--------------+ *See  table(s) above for measurements and observations.  Diagnosing physician: Harold Barban MD Electronically signed by Harold Barban MD on 09/15/2019 at 6:33:03 AM.    Final      Scheduled Meds: . [MAR Hold] atorvastatin  40 mg Oral Daily  . [MAR Hold] budesonide  0.5 mg Inhalation BID  . [MAR Hold] calcitRIOL  0.25 mcg Oral Q T,Th,Sat-1800  . [MAR Hold] calcium acetate  1,334 mg Oral TID WC  . [MAR Hold] calcium carbonate  1 tablet Oral BID WC  . [MAR Hold] Chlorhexidine Gluconate Cloth  6 each Topical Q0600  . [MAR Hold] Chlorhexidine Gluconate Cloth  6 each Topical Q0600  . [MAR Hold] Chlorhexidine  Gluconate Cloth  6 each Topical Q0600  . [MAR Hold] clonazePAM  1 mg Oral QHS  . [MAR Hold] gabapentin  300 mg Oral Daily  . [MAR Hold] heparin  5,000 Units Subcutaneous Q8H  . predniSONE  40 mg Oral Q breakfast   Continuous Infusions: . sodium chloride 10 mL/hr at 09/15/19 1224  . vancomycin       LOS: 6 days    Total time spent: 28 minutes  Darliss Cheney, MD [facility] P7/02/2020, 1:03 PM

## 2019-09-15 NOTE — Progress Notes (Signed)
[facility] Progress Note  Subjective: seen in PACU , no c/o, sp 1st stage LUA BVT aVF  Vitals:   09/15/19 1420 09/15/19 1433 09/15/19 1450 09/15/19 1506  BP: 135/85 113/84 113/82 100/66  Pulse: 86 79 77 71  Resp: 20 18 15    Temp: 98.1 F (36.7 C)  98.1 F (36.7 C)   TempSrc:      SpO2: 95% 94% 95% 95%  Weight:      Height:        Exam: NAD nasal O2 RRR no RG Chest cta bilat  Abd soft ntnd  Ext no edema  Nonfocal, Ox 3   TDC in chest, LUA wounds are new    OP HD: TTS [facility]  4h  60kg  2/2.25 bath  TDC  Hep 4000   Calc 0.25 tiw   venofer 100 x 10   Assessment/ Plan: 1. Severe hyperkalemia: Due to missed dialysis treatment.  Required urgent dialysis on 7/5. Resolved.  2. SOB/ hypoxemic resp failure: no edema on exam and CXR normal. Sp bronchodilators per primary team. Under dry wt by 2kg. Resolved 3. ESRD: HD TTS. Next HD tomorrow.  4. HD access: appreciate VVS assist, had new LUA AVF surgery today 5. Anemia of ESRD: Hemoglobin at goal.  Monitor. 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  Started calcium acetate.   7. HTN/volume: Blood pressure and volume status acceptable.  Continue current antihypertensives.  Monitor BP. No edema on exam.  8. Seizure disorder: CT head with no acute finding.  Per primary team. 9. Dispo - SNF placement in process     Rob Calogero Geisen 09/15/2019, 5:07 PM   Recent Labs  Lab 09/11/19 1109 09/12/19 0404 09/14/19 1201 09/15/19 0358  K  --    < > 3.5 3.9  BUN  --    < > 22 37*  CREATININE  --    < > 4.35* 5.68*  CALCIUM  --    < > 8.2* 8.7*  PHOS  --    < > 3.9 4.4  HGB 10.2*  --   --  8.8*   < > = values in this interval not displayed.   Inpatient medications: . atorvastatin  40 mg Oral Daily  . budesonide  0.5 mg Inhalation BID  . calcitRIOL  0.25 mcg Oral Q T,Th,Sat-1800  . calcium acetate  1,334 mg Oral TID WC  . calcium carbonate  1 tablet Oral BID WC  . Chlorhexidine Gluconate Cloth  6 each Topical Q0600  .  Chlorhexidine Gluconate Cloth  6 each Topical Q0600  . Chlorhexidine Gluconate Cloth  6 each Topical Q0600  . clonazePAM  1 mg Oral QHS  . gabapentin  300 mg Oral Daily  . heparin  5,000 Units Subcutaneous Q8H  . predniSONE  40 mg Oral Q breakfast   . vancomycin     acetaminophen **OR** acetaminophen, hydrALAZINE, ipratropium-albuterol, ondansetron **OR** ondansetron (ZOFRAN) IV, oxyCODONE, polyethylene glycol

## 2019-09-15 NOTE — Anesthesia Procedure Notes (Signed)
Procedure Name: LMA Insertion Date/Time: 09/15/2019 12:35 PM Performed by: Colin Benton, CRNA Pre-anesthesia Checklist: Patient identified, Emergency Drugs available, Suction available and Patient being monitored Patient Re-evaluated:Patient Re-evaluated prior to induction Oxygen Delivery Method: Circle system utilized Preoxygenation: Pre-oxygenation with 100% oxygen Induction Type: IV induction Ventilation: Mask ventilation without difficulty LMA: LMA inserted LMA Size: 4.0 Number of attempts: 1 Placement Confirmation: positive ETCO2 and breath sounds checked- equal and bilateral Tube secured with: Tape Dental Injury: Teeth and Oropharynx as per pre-operative assessment

## 2019-09-15 NOTE — Anesthesia Postprocedure Evaluation (Signed)
Anesthesia Post Note  Patient: Ralph Becker  Procedure(s) Performed: ARTERIOVENOUS (AV) FISTULA CREATION LEFT ARM (Left Arm Upper)     Patient location during evaluation: PACU Anesthesia Type: General Level of consciousness: awake and alert Pain management: pain level controlled Vital Signs Assessment: post-procedure vital signs reviewed and stable Respiratory status: spontaneous breathing, nonlabored ventilation, respiratory function stable and patient connected to nasal cannula oxygen Cardiovascular status: blood pressure returned to baseline and stable Postop Assessment: no apparent nausea or vomiting Anesthetic complications: no   No complications documented.  Last Vitals:  Vitals:   09/15/19 1420 09/15/19 1433  BP: 135/85 113/84  Pulse: 86 79  Resp: 20 18  Temp: 36.7 C   SpO2: 95% 94%    Last Pain:  Vitals:   09/15/19 1420  TempSrc:   PainSc: 10-Worst pain ever                 Robynne Roat DAVID

## 2019-09-15 NOTE — Op Note (Signed)
    OPERATIVE REPORT  DATE OF SURGERY: 09/15/2019  PATIENT: Ralph Becker, 59 y.o. male MRN: [id number]  DOB: May 01, 1960  PRE-OPERATIVE DIAGNOSIS: End-stage renal disease  POST-OPERATIVE DIAGNOSIS:  Same  PROCEDURE: Left first stage basilic vein transposition fistula  SURGEON:  Curt Jews, M.D.  PHYSICIAN ASSISTANT: Nurse  The assistant was needed for exposure and to expedite the case  ANESTHESIA: LMA  EBL: per anesthesia record  Total I/O In: 400 [I.V.:200; IV Piggyback:200] Out: 10 [Blood:10]  BLOOD ADMINISTERED: none  DRAINS: none  SPECIMEN: none  COUNTS CORRECT:  YES  PATIENT DISPOSITION:  PACU - hemodynamically stable  PROCEDURE DETAILS: Patient was taken operating placed supine additionally the area of the left arm prepped draped you sterile fashion.  Incision was made over the basilic vein on the medial aspect of the arm.  The cephalic vein was extremely small by SonoSite.  Basilic brains were vein branches were ligated with 3-0 and 4 silk ties and divided.  The vein was mobilized up to the level above the elbow.  The brachial artery was exposed through a separate incision.  The artery had minimal atherosclerotic change.  The vein was ligated distally and divided and was brought out through a subcutaneous tunnel to the brachial artery.  The artery was occluded proximally distally and was opened with 11 blade sent longstanding with Potts scissors.  A small arteriotomy was created to reduce risk of steal.  The vein was cut to appropriate length and was spatulated and sewn end-to-side to the artery with a running 6-0 Prolene suture.  Clamps were removed and excellent thrill was noted.  The wounds irrigated with saline.  Hemostasis was obtained with electrocautery.  Wounds were closed with 3-0 Vicryl in the subcutaneous and subcuticular tissue.  Sterile dressing was applied and the patient was transferred to the recovery room in stable condition   [name], M.D.,  [facility] 09/15/2019 2:27 PM

## 2019-09-15 NOTE — Anesthesia Preprocedure Evaluation (Addendum)
Anesthesia Evaluation  Patient identified by MRN, date of birth, ID band Patient awake    Reviewed: Allergy & Precautions, NPO status   Airway Mallampati: I  TM Distance: >3 FB Neck ROM: Full    Dental  (+) Edentulous Upper, Edentulous Lower, Dental Advisory Given   Pulmonary COPD, Current Smoker,    Pulmonary exam normal        Cardiovascular hypertension, Pt. on medications and Pt. on home beta blockers Normal cardiovascular exam     Neuro/Psych Seizures -, Poorly Controlled,     GI/Hepatic (+) Hepatitis -, C  Endo/Other    Renal/GU ESRF and DialysisRenal disease     Musculoskeletal   Abdominal   Peds  Hematology   Anesthesia Other Findings   Reproductive/Obstetrics                            Anesthesia Physical Anesthesia Plan  ASA: III  Anesthesia Plan: General   Post-op Pain Management:    Induction: Intravenous  PONV Risk Score and Plan: 1 and Ondansetron  Airway Management Planned: LMA  Additional Equipment:   Intra-op Plan:   Post-operative Plan: Extubation in OR  Informed Consent: I have reviewed the patients History and Physical, chart, labs and discussed the procedure including the risks, benefits and alternatives for the proposed anesthesia with the patient or authorized representative who has indicated his/her understanding and acceptance.     Dental advisory given  Plan Discussed with: CRNA and Surgeon  Anesthesia Plan Comments:        Anesthesia Quick Evaluation

## 2019-09-15 NOTE — Transfer of Care (Signed)
Immediate Anesthesia Transfer of Care Note  Patient: Ralph Becker  Procedure(s) Performed: ARTERIOVENOUS (AV) FISTULA CREATION LEFT ARM (Left Arm Upper)  Patient Location: PACU  Anesthesia Type:General  Level of Consciousness: awake, alert , oriented and patient cooperative  Airway & Oxygen Therapy: Patient Spontanous Breathing and Patient connected to nasal cannula oxygen  Post-op Assessment: Report given to RN, Post -op Vital signs reviewed and stable and Patient moving all extremities X 4  Post vital signs: Reviewed and stable  Last Vitals:  Vitals Value Taken Time  BP 135/85 09/15/19 1419  Temp    Pulse 81 09/15/19 1421  Resp 19 09/15/19 1421  SpO2 96 % 09/15/19 1421  Vitals shown include unvalidated device data.  Last Pain:  Vitals:   09/15/19 0742  TempSrc: Oral  PainSc: 0-No pain      Patients Stated Pain Goal: 0 (41/99/14 4458)  Complications: No complications documented.

## 2019-09-16 ENCOUNTER — Encounter (HOSPITAL_COMMUNITY): Payer: Self-pay | Admitting: Vascular Surgery

## 2019-09-16 LAB — RENAL FUNCTION PANEL
Albumin: 2.1 g/dL — ABNORMAL LOW (ref 3.5–5.0)
Anion gap: 16 — ABNORMAL HIGH (ref 5–15)
BUN: 52 mg/dL — ABNORMAL HIGH (ref 8–23)
CO2: 18 mmol/L — ABNORMAL LOW (ref 22–32)
Calcium: 8.7 mg/dL — ABNORMAL LOW (ref 8.9–10.3)
Chloride: 101 mmol/L (ref 98–111)
Creatinine, Ser: 7.41 mg/dL — ABNORMAL HIGH (ref 0.61–1.24)
GFR calc Af Amer: 8 mL/min — ABNORMAL LOW (ref >60)
GFR calc non Af Amer: 7 mL/min — ABNORMAL LOW (ref >60)
Glucose, Bld: 153 mg/dL — ABNORMAL HIGH (ref 70–99)
Phosphorus: 5.7 mg/dL — ABNORMAL HIGH (ref 2.5–4.6)
Potassium: 4.2 mmol/L (ref 3.5–5.1)
Sodium: 135 mmol/L (ref 135–145)

## 2019-09-16 MED ORDER — CALCITRIOL 0.25 MCG PO CAPS
ORAL_CAPSULE | ORAL | Status: AC
  Administered 2019-09-16: 0.25 ug
  Filled 2019-09-16: qty 1

## 2019-09-16 MED ORDER — HEPARIN SODIUM (PORCINE) 1000 UNIT/ML IJ SOLN
INTRAMUSCULAR | Status: AC
  Administered 2019-09-16: 1000 [IU]
  Filled 2019-09-16: qty 4

## 2019-09-16 NOTE — Procedures (Signed)
   I was present at this dialysis session, have reviewed the session itself and made  appropriate changes [name] MD [place] pager [phone]   09/16/2019, 4:03 PM

## 2019-09-16 NOTE — Progress Notes (Signed)
PROGRESS NOTE    Ralph Becker  TDV:[id number] DOB: 1960/04/04 DOA: 09/08/2019 PCP: Patient, No Pcp Per   Brief Narrative:  Patient is a 59 year old male with history of COPD, end-stage renal disease on dialysis on TTS, hypertension, hyperlipidemia, nicotine dependence, hepatitis C, seizure disorder but not antiepileptics, chronic back pain who presented to the emergency department via EMS brought after suspected episode of seizure.  Patient was unable to contribute any information due to confusion.  He briefly was able to explain that he was sitting in the wheelchair when he experienced started feeling shaky.  As per ED notes, seizure lasted for 1 minute.  Upon further questioning, patient admitted that he missed his dialysis session.On presentation his potassium was 7.7.  Patient was given calcium gluconate, insulin, dextrose and Lokelma.  Nephrology consulted and he underwent emergent dialysis.  Neurology was also consulted regarding suspicion for seizure but not started on antiepileptics because it was suspected to be from uremia.  Hospital course remarkable for new oxygen requirement most likely secondary to his COPD.  Patient evaluated by PT/OT and recommended skilled facility on discharge.  He remains hemodynamically stable.  He is waiting for bed in SNF.  He is medically stable for discharge soon as bed is available.  Assessment & Plan:   Principal Problem:   Hyperkalemia Active Problems:   Mixed hyperlipidemia   Acute metabolic encephalopathy   Hypertensive urgency   Seizure (HCC)   COPD (chronic obstructive pulmonary disease) (HCC)   Hyperkalemia: Presented with potassium of more than 7.  He was given calcium gluconate, insulin, dextrose, Lokelma.    He underwent emergent dialysis.  Now normal.  ESRD on dialysis: Typically dialyzed on TTS is scheduled.  Nephrology following.  Emergent dialysis has been done here.  He said he missed a session of dialysis, he has history of  noncompliance.  Nephrology following here for dialysis.  Patient has been advised for compliance on dialysis.  Vascular surgery has been consulted and patient is scheduled to have AV fistula done today.  Acute metabolic encephalopathy: Present on admission.Secondary to uremic encephalopathy.  CT head was unremarkable.  Normal folic acid, vitamin G62.  Currently he is alert and oriented.  Subclinical hypothyroidism: Elevated TSH in the range of 8.  Normal T4.  Does not indicate Synthroid.    Hypertensive urgency: Presented with severe hypertension most likely secondary to missing dialysis.  Improved after dialysis.  Started home regimen but he became hypotensive and had to be given a bolus of 500cc  on 09/09/2019, so home medications are on hold.  Blood pressure on low normal side despite of holding antihypertensives.  Seizure: As per the EMS report, he had 1 minute of seizure activity. Patient was hospitalized at [facility] in March for seizure but was not sent home on any medications because it was also suspected to be from uremia. Case discussed with neurology.  Since seizure could have been precipitated from uremia, AED not started.  EEG was consistent with toxic/metabolic encephalopathy, no definite epileptiform discharges.  Neurology recommended driving probations for 6 months.  Hyperlipidemia: Continue statin  Acute hypoxic respiratory failure/COPD: Mild wheezing was noted on presentation.  Continue as needed bronchodilator therapy.  He is now on room air.  He was started on prednisone for wheezes.  He has no wheezes since last 3 days.  Today will be day 5 for his prednisone.  We will stop that.  Smoker: Smokes half pack a day.  Counseled for cessation.  Anxiety: Noted  to be jittery,having jerking movements,anxious on 09/12/2019.  He has been doing fine now.  Restarted home Klonopin.  Debility/deconditioning: Patient seen by PT/OT and recommended skilled nursing facility on discharge.   Social worker following.  Patient claims that he has been walking and has improved and was wondering if he could go home.  Per TOC, patient has Medicaid and will not cover home health.  Awaiting PT to see him and provide further recommendations.         DVT prophylaxis:Heparin [place] Code Status: Full Family Communication: Discussed with patient in detail Status IR:[id number]   Dispo: The patient is from: Home              Anticipated d/c is to:SNF              Anticipated d/c date is: Waiting for bed.              Patient currently is medically stable for discharge.     Consultants: Nephrology, neurology  Procedures:None  Antimicrobials:  Anti-infectives (From admission, onward)   Start     Dose/Rate Route Frequency Ordered Stop   09/15/19 1159  vancomycin (VANCOCIN) 1-5 GM/200ML-% IVPB       Note to Pharmacy: Grace Blight   : cabinet override      09/15/19 1159 09/16/19 0014      Subjective: Seen and examined in the dialysis unit.  He has no complaints.  Objective: Vitals:   09/16/19 0930 09/16/19 1000 09/16/19 1025 09/16/19 1030  BP: 126/86 125/82 127/82 113/77  Pulse: 73 76 75 (!) 105  Resp: 17 20 20 20   Temp:    98.3 F (36.8 C)  TempSrc:    Oral  SpO2:   98%   Weight:   60.8 kg   Height:        Intake/Output Summary (Last 24 hours) at 09/16/2019 1233 Last data filed at 09/16/2019 1130 Gross per 24 hour  Intake 705 ml  Output 1010 ml  Net -305 ml   Filed Weights   09/15/19 0600 09/16/19 0650 09/16/19 1025  Weight: 60.4 kg 61.8 kg 60.8 kg    Examination:  General exam: Appears calm and comfortable  Respiratory system: Clear to auscultation. Respiratory effort normal. Cardiovascular system: S1 & S2 heard, RRR. No JVD, murmurs, rubs, gallops or clicks. No pedal edema. Gastrointestinal system: Abdomen is nondistended, soft and nontender. No organomegaly or masses felt. Normal bowel sounds heard. Central nervous system: Alert and oriented. No focal  neurological deficits. Extremities: Symmetric 5 x 5 power. Skin: No rashes, lesions or ulcers.  Psychiatry: Judgement and insight appear normal. Mood & affect appropriate.   Data Reviewed: I have personally reviewed following labs and imaging studies  CBC: Recent Labs  Lab 09/11/19 1109 09/15/19 0358  WBC 8.8 10.6*  HGB 10.2* 8.8*  HCT 33.9* 29.0*  MCV 93.4 94.2  PLT 214 751   Basic Metabolic Panel: Recent Labs  Lab 09/10/19 0403 09/11/19 0242 09/12/19 0404 09/13/19 0402 09/14/19 1201 09/15/19 0358 09/16/19 0305  NA 137   < > 135 137 136 135 135  K 5.3*   < > 4.5 5.1 3.5 3.9 4.2  CL 99   < > 98 98 100 101 101  CO2 26   < > 28 27 28 23  18*  GLUCOSE 85   < > 107* 106* 110* 85 153*  BUN 34*   < > 26* 43* 22 37* 52*  CREATININE 7.09*   < > 5.14* 7.13*  4.35* 5.68* 7.41*  CALCIUM 8.3*   < > 8.4* 8.7* 8.2* 8.7* 8.7*  MG 2.4  --   --   --   --   --   --   PHOS 9.3*   < > 6.6* 8.1* 3.9 4.4 5.7*   < > = values in this interval not displayed.   GFR: Estimated Creatinine Clearance: 9 mL/min (A) (by C-G formula based on SCr of 7.41 mg/dL (H)). Liver Function Tests: Recent Labs  Lab 09/12/19 0404 09/13/19 0402 09/14/19 1201 09/15/19 0358 09/16/19 0305  ALBUMIN 2.1* 2.2* 2.0* 2.0* 2.1*   No results for input(s): LIPASE, AMYLASE in the last 168 hours. No results for input(s): AMMONIA in the last 168 hours. Coagulation Profile: Recent Labs  Lab 09/15/19 0358  INR 1.1   Cardiac Enzymes: No results for input(s): CKTOTAL, CKMB, CKMBINDEX, TROPONINI in the last 168 hours. BNP (last 3 results) No results for input(s): PROBNP in the last 8760 hours. HbA1C: No results for input(s): HGBA1C in the last 72 hours. CBG: No results for input(s): GLUCAP in the last 168 hours. Lipid Profile: No results for input(s): CHOL, HDL, LDLCALC, TRIG, CHOLHDL, LDLDIRECT in the last 72 hours. Thyroid Function Tests: No results for input(s): TSH, T4TOTAL, FREET4, T3FREE, THYROIDAB in the  last 72 hours. Anemia Panel: No results for input(s): VITAMINB12, FOLATE, FERRITIN, TIBC, IRON, RETICCTPCT in the last 72 hours. Sepsis Labs: No results for input(s): PROCALCITON, LATICACIDVEN in the last 168 hours.  Recent Results (from the past 240 hour(s))  SARS Coronavirus 2 by RT PCR (hospital order, performed in [facility] hospital lab) Nasopharyngeal Nasopharyngeal Swab     Status: None   Collection Time: 09/08/19 10:11 PM   Specimen: Nasopharyngeal Swab  Result Value Ref Range Status   SARS Coronavirus 2 NEGATIVE NEGATIVE Final    Comment: (NOTE) SARS-CoV-2 target nucleic acids are NOT DETECTED.  The SARS-CoV-2 RNA is generally detectable in upper and lower respiratory specimens during the acute phase of infection. The lowest concentration of SARS-CoV-2 viral copies this assay can detect is 250 copies / mL. A negative result does not preclude SARS-CoV-2 infection and should not be used as the sole basis for treatment or other patient management decisions.  A negative result may occur with improper specimen collection / handling, submission of specimen other than nasopharyngeal swab, presence of viral mutation(s) within the areas targeted by this assay, and inadequate number of viral copies (<250 copies / mL). A negative result must be combined with clinical observations, patient history, and epidemiological information.  Fact Sheet for Patients:   StrictlyIdeas.no  Fact Sheet for Healthcare Providers: BankingDealers.co.za  This test is not yet approved or  cleared by the [place] FDA and has been authorized for detection and/or diagnosis of SARS-CoV-2 by FDA under an Emergency Use Authorization (EUA).  This EUA will remain in effect (meaning this test can be used) for the duration of the COVID-19 declaration under Section 564(b)(1) of the Act, 21 U.S.C. section 360bbb-3(b)(1), unless the authorization is terminated  or revoked sooner.  Performed at [facility], [place] [address] [place], [place] [address]   Urine culture     Status: Abnormal   Collection Time: 09/08/19 10:22 PM   Specimen: Urine, Random  Result Value Ref Range Status   Specimen Description URINE, RANDOM  Final   Special Requests NONE  Final   Culture (A)  Final    <10,000 COLONIES/mL INSIGNIFICANT GROWTH Performed at [facility],  [address], [place], [place] [address]    Report Status 09/10/2019 FINAL  Final     Radiology Studies: VAS [place] UPPER EXT VEIN MAPPING (PRE-OP AVF)  Result Date: 09/15/2019 UPPER EXTREMITY VEIN MAPPING  Indications: Pre-dialysis access. Limitations: Body habitus. IV, right forearm. Comparison Study: No prior study on file Performing Technologist: Sharion Dove RVS  Examination Guidelines: A complete evaluation includes B-mode imaging, spectral Doppler, color Doppler, and power Doppler as needed of all accessible portions of each vessel. Bilateral testing is considered an integral part of a complete examination. Limited examinations for reoccurring indications may be performed as noted. +-----------------+-------------+----------+---------+ Right Cephalic   Diameter (cm)Depth (cm)Findings  +-----------------+-------------+----------+---------+ Mid upper arm        0.30        1.49             +-----------------+-------------+----------+---------+ Dist upper arm       0.33        1.70   branching +-----------------+-------------+----------+---------+ Antecubital fossa    0.26        1.34             +-----------------+-------------+----------+---------+ Prox forearm                            branching +-----------------+-------------+----------+---------+ +-----------------+-------------+----------+---------+ Right Basilic    Diameter (cm)Depth (cm)Findings  +-----------------+-------------+----------+---------+ Prox upper arm       0.44        0.22              +-----------------+-------------+----------+---------+ Mid upper arm        0.50        0.32             +-----------------+-------------+----------+---------+ Dist upper arm       0.50        0.20   branching +-----------------+-------------+----------+---------+ Antecubital fossa    0.49        0.21             +-----------------+-------------+----------+---------+ Prox forearm         0.30        0.22   branching +-----------------+-------------+----------+---------+ Mid forearm          0.30        0.13             +-----------------+-------------+----------+---------+ Wrist                0.16        0.09             +-----------------+-------------+----------+---------+ +-----------------+-------------+----------+--------------+ Left Cephalic    Diameter (cm)Depth (cm)   Findings    +-----------------+-------------+----------+--------------+ Prox upper arm                          not visualized +-----------------+-------------+----------+--------------+ Mid upper arm                           not visualized +-----------------+-------------+----------+--------------+ Dist upper arm       0.15        0.16                  +-----------------+-------------+----------+--------------+ Antecubital fossa    0.24        0.24                  +-----------------+-------------+----------+--------------+ Prox forearm  0.17        0.22                  +-----------------+-------------+----------+--------------+ Mid forearm          0.16        0.17                  +-----------------+-------------+----------+--------------+ Wrist                                   not visualized +-----------------+-------------+----------+--------------+ +-----------------+-------------+----------+--------------+ Left Basilic     Diameter (cm)Depth (cm)   Findings    +-----------------+-------------+----------+--------------+ Prox upper arm       0.59         0.22       origin     +-----------------+-------------+----------+--------------+ Mid upper arm        0.43        0.36                  +-----------------+-------------+----------+--------------+ Dist upper arm       0.47        0.20                  +-----------------+-------------+----------+--------------+ Antecubital fossa    0.32        0.26                  +-----------------+-------------+----------+--------------+ Prox forearm         0.31        0.24                  +-----------------+-------------+----------+--------------+ Mid forearm          0.24        0.10                  +-----------------+-------------+----------+--------------+ Distal forearm       0.17        0.16                  +-----------------+-------------+----------+--------------+ Wrist                                   not visualized +-----------------+-------------+----------+--------------+ *See table(s) above for measurements and observations.  Diagnosing physician: Harold Barban MD Electronically signed by Harold Barban MD on 09/15/2019 at 6:33:03 AM.    Final      Scheduled Meds: . atorvastatin  40 mg Oral Daily  . budesonide  0.5 mg Inhalation BID  . calcitRIOL  0.25 mcg Oral Q T,Th,Sat-1800  . calcium acetate  1,334 mg Oral TID WC  . calcium carbonate  1 tablet Oral BID WC  . Chlorhexidine Gluconate Cloth  6 each Topical Q0600  . Chlorhexidine Gluconate Cloth  6 each Topical Q0600  . Chlorhexidine Gluconate Cloth  6 each Topical Q0600  . Chlorhexidine Gluconate Cloth  6 each Topical Q0600  . clonazePAM  1 mg Oral QHS  . gabapentin  300 mg Oral Daily  . heparin  5,000 Units Subcutaneous Q8H   Continuous Infusions:    LOS: 7 days    Total time spent: 27 minutes  Darliss Cheney, MD [facility] P7/13/2021, 12:33 PM

## 2019-09-16 NOTE — TOC Transition Note (Signed)
Transition of Care [facility]) - CM/SW Discharge Note   Patient Details  Name: Ralph Becker MRN: [id number] Date of Birth: 11/24/1960  [facility] CM/SW Contact:  Pollie Friar, RN Phone Number: 09/16/2019, 4:21 PM   Clinical Narrative:    Asked by CSW to place orders for Outpatient therapy for pt. He states he has transportation to outpatient therapy. He prefers [name] as it is closer to his home.  CM will fax orders to [facility] once d/c summary completed.  Per CSW pt has recommended DME at home.    Final next level of care: OP Rehab Barriers to Discharge: No Barriers Identified   Patient Goals and CMS Choice Patient states their goals for this hospitalization and ongoing recovery are:: to get rehab near [facility].gov Compare Post Acute Care list provided to:: Patient Choice offered to / list presented to : Patient  Discharge Placement                       Discharge Plan and Services     Post Acute Care Choice: [place]                               Social Determinants of Health (SDOH) Interventions     Readmission Risk Interventions No flowsheet data found.

## 2019-09-16 NOTE — Progress Notes (Signed)
Pt reported large loose stool.  When asked how often pt has been having loose stools, pt replied everyday 3-4 stools a day. MD notified

## 2019-09-16 NOTE — Progress Notes (Signed)
Physical Therapy Treatment Patient Details Name: Ralph Becker MRN: [id number] DOB: [birth year] Today's Date: 09/16/2019    History of Present Illness 59yo male presenting to the ED s/p seizure. Reports multiple HD sessions. Admitted with hyperkalemia, hypertensive urgency, seizure. PMH hepatitis C, COPD, ESRD on HD, HTN, HLD, seizure disorder, hx multiple TBIs    PT Comments    Patient received in bed, very pleasant and appropriate with PT today. Able to complete bed mobility with Mod(I), then only S to light min guard for functional transfers and gait approximately 56f with no device although he demonstrated high level gait pattern and was able to make tight turns quickly without LOB or unsteadiness and did not need external support to steady. No jerking or clonic movements noted today. SpO2 no lower than 95% on room air during session. Discussed updated PT recommendations- per CSW medicaid will not cover HHPT (which would have been therapist's next recommendation) so feel he would definitely benefit from at least OP PT if this is available to him. He does clarify that his room mate is basically at home 24/7 and is happy to help him.  Left sitting in bed with all needs met, bed alarm active this afternoon, MD aware of updated PT recommendations.     Follow Up Recommendations  Outpatient PT;Supervision/Assistance - 24 hour (medicaid will not cover HHPT per CSW)     Equipment Recommendations  Rolling walker with 5" wheels;3in1 (PT)    Recommendations for Other Services       Precautions / Restrictions Precautions Precautions: Fall;Other (comment) Precaution Comments: watch O2, has clonic jerking movements that onset with progressive fatigue Restrictions Weight Bearing Restrictions: No    Mobility  Bed Mobility Overal bed mobility: Modified Independent Bed Mobility: Supine to Sit;Sit to Supine     Supine to sit: Modified independent (Device/Increase time) Sit to supine: Modified  independent (Device/Increase time)   General bed mobility comments: Mod I   Transfers Overall transfer level: Needs assistance Equipment used: None Transfers: Sit to/from Stand Sit to Stand: Supervision         General transfer comment: close S but able to perform functional transfers well, did need assist for lines  Ambulation/Gait Ambulation/Gait assistance: Min guard Gait Distance (Feet): 80 Feet Assistive device: None Gait Pattern/deviations: WFL(Within Functional Limits);Step-through pattern Gait velocity: decreased   General Gait Details: able to easily gait train with WNL gait pattern and no LOB even on tight turns- no jerking/clonic movements or unsteadiness noted. Min guard provided for safety but honestly not really needed   Stairs             Wheelchair Mobility    Modified Rankin (Stroke Patients Only)       Balance Overall balance assessment: Needs assistance Sitting-balance support: No upper extremity supported;Feet unsupported Sitting balance-Leahy Scale: Normal     Standing balance support: During functional activity Standing balance-Leahy Scale: Good Standing balance comment: min guard for safety but able to maintain balance without external assist                            Cognition Arousal/Alertness: Awake/alert Behavior During Therapy: WFL for tasks assessed/performed Overall Cognitive Status: No family/caregiver present to determine baseline cognitive functioning Area of Impairment: Safety/judgement;Awareness;Problem solving;Memory                     Memory: Decreased short-term memory   Safety/Judgement: Decreased awareness of safety;Decreased awareness of deficits Awareness:  Intellectual   General Comments: ongoing mild deficits and STM deficits but MUCH improved since evaluation. appropraite and accurate with communication with PT. Safety much improved since eval.      Exercises      General Comments  General comments (skin integrity, edema, etc.): SpO2 no lower than 95% on room air with PT      Pertinent Vitals/Pain Pain Assessment: Faces Faces Pain Scale: Hurts a little bit Pain Location: back Pain Descriptors / Indicators: Aching;Discomfort Pain Intervention(s): Limited activity within patient's tolerance;Monitored during session    Home Living                      Prior Function            PT Goals (current goals can now be found in the care plan section) Acute Rehab PT Goals Patient Stated Goal: go home after I stop shaking PT Goal Formulation: With patient Time For Goal Achievement: 09/23/19 Potential to Achieve Goals: Fair Progress towards PT goals: Progressing toward goals    Frequency    Min 3X/week      PT Plan Discharge plan needs to be updated;Frequency needs to be updated    Co-evaluation              AM-PAC PT "6 Clicks" Mobility   Outcome Measure  Help needed turning from your back to your side while in a flat bed without using bedrails?: None Help needed moving from lying on your back to sitting on the side of a flat bed without using bedrails?: None Help needed moving to and from a bed to a chair (including a wheelchair)?: A Little Help needed standing up from a chair using your arms (e.g., wheelchair or bedside chair)?: A Little Help needed to walk in hospital room?: A Little Help needed climbing 3-5 steps with a railing? : A Little 6 Click Score: 20    End of Session Equipment Utilized During Treatment: Gait belt Activity Tolerance: Patient tolerated treatment well Patient left: in bed;with call bell/phone within reach;with bed alarm set Nurse Communication: Mobility status PT Visit Diagnosis: Unsteadiness on feet (R26.81);Muscle weakness (generalized) (M62.81);Difficulty in walking, not elsewhere classified (R26.2)     Time: 2353-6144 PT Time Calculation (min) (ACUTE ONLY): 14 min  Charges:  $Gait Training: 8-22  mins                     Windell Norfolk, DPT, PN1   Supplemental Physical Therapist [place]    Pager [phone] Acute Rehab Office [phone]

## 2019-09-16 NOTE — Progress Notes (Signed)
PT Cancellation Note  Patient Details Name: Ralph Becker MRN: [id number] DOB: [birth year]   Cancelled Treatment:    Reason Eval/Treat Not Completed: Patient at procedure or test/unavailable At HD/unavailable for PT. Will attempt to return later if time/schedule allow.   Windell Norfolk, DPT, PN1   Supplemental Physical Therapist [facility]    Pager [phone] Acute Rehab Office [phone]

## 2019-09-16 NOTE — Progress Notes (Signed)
  Progress Note    09/16/2019 8:04 AM 1 Day Post-Op  Subjective:  Seen in HD unit. No comps via right IJ TDC. Some post-op pain   Vitals:   09/16/19 0700 09/16/19 0730  BP: 132/78 115/74  Pulse: 78 70  Resp: 19 19  Temp:    SpO2:      Physical Exam: Cardiac:  RRR Lungs:  nonlabored Incisions:  Well approximated. Some erythema. No drainage or hematoma. Extremities:  5/5 grip strength. Motor and sensation intact. 3+ left radial pulse. Good thrill and bruit in fistula   CBC    Component Value Date/Time   WBC 10.6 (H) 09/15/2019 0358   RBC 3.08 (L) 09/15/2019 0358   HGB 8.8 (L) 09/15/2019 0358   HCT 29.0 (L) 09/15/2019 0358   PLT 198 09/15/2019 0358   MCV 94.2 09/15/2019 0358   MCH 28.6 09/15/2019 0358   MCHC 30.3 09/15/2019 0358   RDW 17.3 (H) 09/15/2019 0358   LYMPHSABS 2.1 09/09/2019 0615   MONOABS 0.6 09/09/2019 0615   EOSABS 0.2 09/09/2019 0615   BASOSABS 0.1 09/09/2019 0615    BMET    Component Value Date/Time   NA 135 09/16/2019 0305   K 4.2 09/16/2019 0305   CL 101 09/16/2019 0305   CO2 18 (L) 09/16/2019 0305   GLUCOSE 153 (H) 09/16/2019 0305   BUN 52 (H) 09/16/2019 0305   CREATININE 7.41 (H) 09/16/2019 0305   CALCIUM 8.7 (L) 09/16/2019 0305   GFRNONAA 7 (L) 09/16/2019 0305   GFRAA 8 (L) 09/16/2019 0305     Intake/Output Summary (Last 24 hours) at 09/16/2019 0804 Last data filed at 09/15/2019 1438 Gross per 24 hour  Intake 450 ml  Output 10 ml  Net 440 ml    HOSPITAL MEDICATIONS Scheduled Meds: . atorvastatin  40 mg Oral Daily  . budesonide  0.5 mg Inhalation BID  . calcitRIOL  0.25 mcg Oral Q T,Th,Sat-1800  . calcium acetate  1,334 mg Oral TID WC  . calcium carbonate  1 tablet Oral BID WC  . Chlorhexidine Gluconate Cloth  6 each Topical Q0600  . Chlorhexidine Gluconate Cloth  6 each Topical Q0600  . Chlorhexidine Gluconate Cloth  6 each Topical Q0600  . Chlorhexidine Gluconate Cloth  6 each Topical Q0600  . clonazePAM  1 mg Oral QHS    . gabapentin  300 mg Oral Daily  . heparin  5,000 Units Subcutaneous Q8H   Continuous Infusions: PRN Meds:.acetaminophen **OR** acetaminophen, hydrALAZINE, ipratropium-albuterol, ondansetron **OR** ondansetron (ZOFRAN) IV, oxyCODONE, polyethylene glycol  Assessment: POD 1 left B-C AVF. Stable post-op    Plan: -We will make arrangements for outpt follow-up.    Risa Grill, PA-C [facility] [phone] 09/16/2019  8:04 AM

## 2019-09-17 LAB — RENAL FUNCTION PANEL
Albumin: 2.1 g/dL — ABNORMAL LOW (ref 3.5–5.0)
Anion gap: 12 (ref 5–15)
BUN: 35 mg/dL — ABNORMAL HIGH (ref 8–23)
CO2: 24 mmol/L (ref 22–32)
Calcium: 8.4 mg/dL — ABNORMAL LOW (ref 8.9–10.3)
Chloride: 100 mmol/L (ref 98–111)
Creatinine, Ser: 5.32 mg/dL — ABNORMAL HIGH (ref 0.61–1.24)
GFR calc Af Amer: 12 mL/min — ABNORMAL LOW (ref >60)
GFR calc non Af Amer: 11 mL/min — ABNORMAL LOW (ref >60)
Glucose, Bld: 151 mg/dL — ABNORMAL HIGH (ref 70–99)
Phosphorus: 2.6 mg/dL (ref 2.5–4.6)
Potassium: 3.5 mmol/L (ref 3.5–5.1)
Sodium: 136 mmol/L (ref 135–145)

## 2019-09-17 NOTE — Discharge Summary (Signed)
Physician Discharge Summary  Ralph Becker CBS:[id number] DOB: [birth year] DOA: 09/08/2019  PCP: Patient, No Pcp Per  Admit date: 09/08/2019 Discharge date: 09/17/2019  Admitted From: Home Disposition: Home  Recommendations for Outpatient Follow-up:  1. Follow up with PCP in 1-2 weeks 2. Follow-up with vascular surgery as outpatient 3. Follow-up with outpatient hemodialysis 4. You are referred for outpatient PT 5. You are being advised to not drive a vehicle for neck 6 months 6. Please obtain BMP/CBC in one week 7. Please follow up with your PCP on the following pending results: Unresulted Labs (From admission, onward) Comment          Start     Ordered   09/10/19 0500  Renal function panel  Daily,   R      09/09/19 0747   Signed and Held  Renal function panel  Once,   R        Signed and Held   Signed and Held  CBC  Once,   R        Signed and Held   Signed and Held  Renal function panel  Once,   R        Signed and Held   Signed and Held  CBC  Once,   R        Signed and Held           Home Health: None Equipment/Devices: Wheeled walker and commode  Discharge Condition: Stable CODE STATUS: Full code Diet recommendation: Renal  Subjective: Seen and examined.  Feels much better without any symptoms.  Brief/Interim Summary: Patient is a 59 year old male with history of COPD, end-stage renal disease on dialysis on TTS, hypertension, hyperlipidemia, nicotine dependence, hepatitis C, seizure disorder but not antiepileptics, chronic back pain who presented to the emergency department via EMS brought after suspected episode of seizure.  Patient was unable to contribute any information due to confusion.  He briefly was able to explain that he was sitting in the wheelchair when he experienced started feeling shaky.  As per ED notes, seizure lasted for 1 minute.  Upon further questioning, patient admitted that he missed his dialysis session.On presentation his potassium was 7.7.  Patient  was given calcium gluconate, insulin, dextrose and Lokelma.  Nephrology consulted and he underwent emergent dialysis.  Neurology was also consulted regarding suspicion for seizure but not started on antiepileptics because it was suspected to be from uremia however they recommended driving probations for 6 months.  Hospital course remarkable for new oxygen requirement most likely secondary to his COPD but he has been weaned down to room air now.  Patient evaluated by PT/OT and recommended skilled facility on discharge.  Patient waited for placement for several days but continue to work with physical therapy and before any bed was obtained for him, patient's physical status improved significantly to the point that PT had recommended discharging home with home health however due to him having Medicaid insurance, home health was not covered so PT recommended outpatient PT for which referral has been given to him.  Patient also had left upper extremity AV fistula done by vascular surgery 2 days ago here.  Patient's acute metabolic encephalopathy has resolved.  He had elevated TSH with normal T4 making a diagnosis of subclinical hypothyroidism.  No need of Synthroid.  He also came in with hypertensive urgency which was thought to be secondary to missing dialysis.  This improved after dialysis.  He was started back on his home antihypertensive regimen but  he developed hypotensive requiring 500 cc IV fluid bolus to improve his blood pressure and his antihypertensives were held.  Now his blood pressure has started to come back again so some of his antihypertensives are being continued at discharge.  He was on amlodipine, losartan, labetalol and metoprolol both, hydralazine.  He has been continued on hydralazine and metoprolol but labetalol, losartan and amlodipine is being discontinued at this point in time.  He is being discharged in stable condition.  Discharge Diagnoses:  Principal Problem:   Hyperkalemia Active  Problems:   Mixed hyperlipidemia   Acute metabolic encephalopathy   Hypertensive urgency   Seizure (HCC)   COPD (chronic obstructive pulmonary disease) [facility])    Discharge Instructions  Discharge Instructions    Ambulatory referral to Occupational Therapy   Complete by: As directed    Ambulatory referral to Physical Therapy   Complete by: As directed      Allergies as of 09/17/2019      Reactions   Penicillins    Unknown. Can't remember.       Medication List    STOP taking these medications   amLODipine 10 MG tablet Commonly known as: NORVASC   clotrimazole 1 % cream Commonly known as: LOTRIMIN   labetalol 200 MG tablet Commonly known as: NORMODYNE   losartan 50 MG tablet Commonly known as: COZAAR     TAKE these medications   atorvastatin 40 MG tablet Commonly known as: LIPITOR Take 40 mg by mouth daily.   clonazePAM 1 MG tablet Commonly known as: KLONOPIN Take 1 mg by mouth at bedtime.   Flovent HFA 220 MCG/ACT inhaler Generic drug: fluticasone Inhale 2 puffs into the lungs every 12 (twelve) hours.   gabapentin 300 MG capsule Commonly known as: NEURONTIN Take 300 mg by mouth daily.   hydrALAZINE 50 MG tablet Commonly known as: APRESOLINE Take 50 mg by mouth in the morning and at bedtime.   metoprolol tartrate 25 MG tablet Commonly known as: LOPRESSOR Take 25 mg by mouth 2 (two) times daily.   sodium bicarbonate 650 MG tablet Take 650 mg by mouth 2 (two) times daily.   Suboxone 8-2 MG Film Generic drug: Buprenorphine HCl-Naloxone HCl Place 1 Film under the tongue daily.   tamsulosin 0.4 MG Caps capsule Commonly known as: FLOMAX Take 0.4 mg by mouth at bedtime.            Durable Medical Equipment  (From admission, onward)         Start     Ordered   09/16/19 1619  For home use only DME 3 n 1  Once        09/16/19 1619   09/16/19 1619  For home use only DME Walker rolling  Once       Question Answer Comment  Walker: With 5 Inch  Wheels   Patient needs a walker to treat with the following condition Weakness      09/16/19 1619          Follow-up Information    [facility] Follow up in 5 week(s).   Specialty: Vascular Surgery Why: sent Contact information: [place] [address] [phone]             Allergies  Allergen Reactions  . Penicillins     Unknown. Can't remember.     Consultations: Nephrology and vascular surgery   Procedures/Studies: EEG  Result Date: 09/09/2019 Greta Doom, MD  09/09/2019 10:47 AM History: 59 year old male who missed some dialysis and presented with a BUN of 100 and encephalopathy.  In the setting, he had seizure-like activity. Sedation: None Technique: This is a 21 channel routine scalp EEG performed at the bedside with bipolar and monopolar montages arranged in accordance to the international 10/20 system of electrode placement. One channel was dedicated to EKG recording. Background: There is a posterior dominant rhythm of 7 - 8Hz , however the background is dominated by generalized irregular delta and theta range slow activity.  There is also intermittent frontally predominant rhythmic delta range activity(FIRDA) without evolution.  There are frequent triphasic morphology discharges with bifrontal predominance.  There is excessive beta which at times does give a sharply contoured appearance to the background as well.  He has frequent myoclonus which is associated with muscle artifact on EEG. Photic stimulation: Physiologic driving is not performed EEG Abnormalities: 1) triphasic waves 2) frontal intermittent rhythmic delta activity (FIRDA) 3) generalized irregular slow activity 4) slow PDR Clinical Interpretation: This EEG is most consistent with a toxic/metabolic encephalopathy.  The triphasic discharges are very sharply contoured at times, and these can represent cortical irritability, but I do not feel  there are any definite epileptiform discharges.  If high concern for epilepsy remains, could consider repeating the study after further dialysis/correction of metabolic derangements. Roland Rack, MD [facility] [phone] If 7pm- 7am, please page neurology on call as listed in [place].   CT HEAD WO CONTRAST  Result Date: 09/08/2019 CLINICAL DATA:  Recent seizure activity EXAM: CT HEAD WITHOUT CONTRAST TECHNIQUE: Contiguous axial images were obtained from the base of the skull through the vertex without intravenous contrast. COMPARISON:  08/09/2019 FINDINGS: Brain: No evidence of acute infarction, hemorrhage, hydrocephalus, extra-axial collection or mass lesion/mass effect. Mild chronic white matter ischemic changes are noted. Vascular: No hyperdense vessel or unexpected calcification. Skull: Normal. Negative for fracture or focal lesion. Sinuses/Orbits: No acute finding. Other: None. IMPRESSION: No acute intracranial abnormality noted. Electronically Signed   By: Inez Catalina M.D.   On: 09/08/2019 19:34   DG CHEST PORT 1 VIEW  Result Date: 09/10/2019 CLINICAL DATA:  Shortness of breath COPD EXAM: PORTABLE CHEST 1 VIEW COMPARISON:  09/08/2019 FINDINGS: RIGHT-sided dual lumen dialysis catheter in place. Cardiomediastinal contours and hilar structures are stable with catheter terminating at the caval to atrial junction/upper RIGHT atrium with unchanged appearance. Lungs are clear. No sign of effusion. Thoracic spinal fusion as before. Limited assessment of skeletal structures otherwise without acute process. IMPRESSION: No active disease. Electronically Signed   By: Zetta Bills M.D.   On: 09/10/2019 08:54   DG Chest Port 1 View  Result Date: 09/08/2019 CLINICAL DATA:  Seizure. EXAM: PORTABLE CHEST 1 VIEW COMPARISON:  August 09, 2019 (7:31 a.m.) FINDINGS: The endotracheal tube and nasogastric tube seen on the prior study have been removed. A right internal jugular venous catheter is noted  with its distal tip at the junction of the superior vena cava and right atrium. This represents a new finding. Mild, diffuse chronic appearing increased lung markings are seen. There is no evidence of acute infiltrate, pleural effusion or pneumothorax. The heart size and mediastinal contours are within normal limits. Multiple chronic right-sided rib fractures are seen. IMPRESSION: Interval right internal jugular venous catheter placement positioning, as described above, without evidence of acute or active cardiopulmonary disease. Electronically Signed   By: Virgina Norfolk M.D.   On: 09/08/2019 18:31   VAS [place] UPPER EXT VEIN MAPPING (PRE-OP AVF)  Result Date: 09/15/2019 UPPER EXTREMITY VEIN MAPPING  Indications: Pre-dialysis access. Limitations: Body habitus. IV, right forearm. Comparison Study: No prior study on file Performing Technologist: Sharion Dove RVS  Examination Guidelines: A complete evaluation includes B-mode imaging, spectral Doppler, color Doppler, and power Doppler as needed of all accessible portions of each vessel. Bilateral testing is considered an integral part of a complete examination. Limited examinations for reoccurring indications may be performed as noted. +-----------------+-------------+----------+---------+ Right Cephalic   Diameter (cm)Depth (cm)Findings  +-----------------+-------------+----------+---------+ Mid upper arm        0.30        1.49             +-----------------+-------------+----------+---------+ Dist upper arm       0.33        1.70   branching +-----------------+-------------+----------+---------+ Antecubital fossa    0.26        1.34             +-----------------+-------------+----------+---------+ Prox forearm                            branching +-----------------+-------------+----------+---------+ +-----------------+-------------+----------+---------+ Right Basilic    Diameter (cm)Depth (cm)Findings   +-----------------+-------------+----------+---------+ Prox upper arm       0.44        0.22             +-----------------+-------------+----------+---------+ Mid upper arm        0.50        0.32             +-----------------+-------------+----------+---------+ Dist upper arm       0.50        0.20   branching +-----------------+-------------+----------+---------+ Antecubital fossa    0.49        0.21             +-----------------+-------------+----------+---------+ Prox forearm         0.30        0.22   branching +-----------------+-------------+----------+---------+ Mid forearm          0.30        0.13             +-----------------+-------------+----------+---------+ Wrist                0.16        0.09             +-----------------+-------------+----------+---------+ +-----------------+-------------+----------+--------------+ Left Cephalic    Diameter (cm)Depth (cm)   Findings    +-----------------+-------------+----------+--------------+ Prox upper arm                          not visualized +-----------------+-------------+----------+--------------+ Mid upper arm                           not visualized +-----------------+-------------+----------+--------------+ Dist upper arm       0.15        0.16                  +-----------------+-------------+----------+--------------+ Antecubital fossa    0.24        0.24                  +-----------------+-------------+----------+--------------+ Prox forearm         0.17        0.22                  +-----------------+-------------+----------+--------------+ Mid  forearm          0.16        0.17                  +-----------------+-------------+----------+--------------+ Wrist                                   not visualized +-----------------+-------------+----------+--------------+ +-----------------+-------------+----------+--------------+ Left Basilic     Diameter (cm)Depth (cm)    Findings    +-----------------+-------------+----------+--------------+ Prox upper arm       0.59        0.22       origin     +-----------------+-------------+----------+--------------+ Mid upper arm        0.43        0.36                  +-----------------+-------------+----------+--------------+ Dist upper arm       0.47        0.20                  +-----------------+-------------+----------+--------------+ Antecubital fossa    0.32        0.26                  +-----------------+-------------+----------+--------------+ Prox forearm         0.31        0.24                  +-----------------+-------------+----------+--------------+ Mid forearm          0.24        0.10                  +-----------------+-------------+----------+--------------+ Distal forearm       0.17        0.16                  +-----------------+-------------+----------+--------------+ Wrist                                   not visualized +-----------------+-------------+----------+--------------+ *See table(s) above for measurements and observations.  Diagnosing physician: Harold Barban MD Electronically signed by Harold Barban MD on 09/15/2019 at 6:33:03 AM.    Final      Discharge Exam: Vitals:   09/17/19 0803 09/17/19 0901  BP:  119/71  Pulse:    Resp:    Temp:    SpO2: 96%    Vitals:   09/16/19 1951 09/17/19 0705 09/17/19 0803 09/17/19 0901  BP: (!) 156/92 (!) 162/114  119/71  Pulse: 77 79    Resp: 18 19    Temp: 98.2 F (36.8 C) 98.2 F (36.8 C)    TempSrc: Oral Oral    SpO2: 97%  96%   Weight:      Height:        General: Pt is alert, awake, not in acute distress Cardiovascular: RRR, S1/S2 +, no rubs, no gallops Respiratory: CTA bilaterally, no wheezing, no rhonchi Abdominal: Soft, NT, ND, bowel sounds + Extremities: no edema, no cyanosis    The results of significant diagnostics from this hospitalization (including imaging, microbiology, ancillary and  laboratory) are listed below for reference.     Microbiology: Recent Results (from the past 240 hour(s))  SARS Coronavirus 2 by RT PCR (hospital order, performed in [facility] hospital lab) Nasopharyngeal Nasopharyngeal Swab  Status: None   Collection Time: 09/08/19 10:11 PM   Specimen: Nasopharyngeal Swab  Result Value Ref Range Status   SARS Coronavirus 2 NEGATIVE NEGATIVE Final    Comment: (NOTE) SARS-CoV-2 target nucleic acids are NOT DETECTED.  The SARS-CoV-2 RNA is generally detectable in upper and lower respiratory specimens during the acute phase of infection. The lowest concentration of SARS-CoV-2 viral copies this assay can detect is 250 copies / mL. A negative result does not preclude SARS-CoV-2 infection and should not be used as the sole basis for treatment or other patient management decisions.  A negative result may occur with improper specimen collection / handling, submission of specimen other than nasopharyngeal swab, presence of viral mutation(s) within the areas targeted by this assay, and inadequate number of viral copies (<250 copies / mL). A negative result must be combined with clinical observations, patient history, and epidemiological information.  Fact Sheet for Patients:   StrictlyIdeas.no  Fact Sheet for Healthcare Providers: BankingDealers.co.za  This test is not yet approved or  cleared by the [place] FDA and has been authorized for detection and/or diagnosis of SARS-CoV-2 by FDA under an Emergency Use Authorization (EUA).  This EUA will remain in effect (meaning this test can be used) for the duration of the COVID-19 declaration under Section 564(b)(1) of the Act, 21 U.S.C. section 360bbb-3(b)(1), unless the authorization is terminated or revoked sooner.  Performed at [facility], [place] [address], [place], [place] [address]   Urine culture     Status: Abnormal   Collection Time:  09/08/19 10:22 PM   Specimen: Urine, Random  Result Value Ref Range Status   Specimen Description URINE, RANDOM  Final   Special Requests NONE  Final   Culture (A)  Final    <10,000 COLONIES/mL INSIGNIFICANT GROWTH Performed at [facility], [place] [address], [place], [place] [address]    Report Status 09/10/2019 FINAL  Final     Labs: BNP (last 3 results) No results for input(s): BNP in the last 8760 hours. Basic Metabolic Panel: Recent Labs  Lab 09/13/19 0402 09/14/19 1201 09/15/19 0358 09/16/19 0305 09/17/19 1011  NA 137 136 135 135 136  K 5.1 3.5 3.9 4.2 3.5  CL 98 100 101 101 100  CO2 27 28 23  18* 24  GLUCOSE 106* 110* 85 153* 151*  BUN 43* 22 37* 52* 35*  CREATININE 7.13* 4.35* 5.68* 7.41* 5.32*  CALCIUM 8.7* 8.2* 8.7* 8.7* 8.4*  PHOS 8.1* 3.9 4.4 5.7* 2.6   Liver Function Tests: Recent Labs  Lab 09/13/19 0402 09/14/19 1201 09/15/19 0358 09/16/19 0305 09/17/19 1011  ALBUMIN 2.2* 2.0* 2.0* 2.1* 2.1*   No results for input(s): LIPASE, AMYLASE in the last 168 hours. No results for input(s): AMMONIA in the last 168 hours. CBC: Recent Labs  Lab 09/11/19 1109 09/15/19 0358  WBC 8.8 10.6*  HGB 10.2* 8.8*  HCT 33.9* 29.0*  MCV 93.4 94.2  PLT 214 198   Cardiac Enzymes: No results for input(s): CKTOTAL, CKMB, CKMBINDEX, TROPONINI in the last 168 hours. BNP: Invalid input(s): POCBNP CBG: No results for input(s): GLUCAP in the last 168 hours. D-Dimer No results for input(s): DDIMER in the last 72 hours. Hgb A1c No results for input(s): HGBA1C in the last 72 hours. Lipid Profile No results for input(s): CHOL, HDL, LDLCALC, TRIG, CHOLHDL, LDLDIRECT in the last 72 hours. Thyroid function studies No results for input(s): TSH, T4TOTAL, T3FREE, THYROIDAB in the last 72 hours.  Invalid input(s):  FREET3 Anemia work up No results for input(s): VITAMINB12, FOLATE, FERRITIN, TIBC, IRON, RETICCTPCT in the last 72 hours. Urinalysis    Component Value  Date/Time   COLORURINE YELLOW 09/08/2019 2221   APPEARANCEUR CLEAR 09/08/2019 2221   LABSPEC 1.012 09/08/2019 2221   PHURINE 8.0 09/08/2019 2221   GLUCOSEU >=500 (A) 09/08/2019 2221   HGBUR NEGATIVE 09/08/2019 2221   BILIRUBINUR NEGATIVE 09/08/2019 2221   KETONESUR NEGATIVE 09/08/2019 2221   PROTEINUR >=300 (A) 09/08/2019 2221   NITRITE NEGATIVE 09/08/2019 2221   LEUKOCYTESUR NEGATIVE 09/08/2019 2221   Sepsis Labs Invalid input(s): PROCALCITONIN,  WBC,  LACTICIDVEN Microbiology Recent Results (from the past 240 hour(s))  SARS Coronavirus 2 by RT PCR (hospital order, performed in [place] hospital lab) Nasopharyngeal Nasopharyngeal Swab     Status: None   Collection Time: 09/08/19 10:11 PM   Specimen: Nasopharyngeal Swab  Result Value Ref Range Status   SARS Coronavirus 2 NEGATIVE NEGATIVE Final    Comment: (NOTE) SARS-CoV-2 target nucleic acids are NOT DETECTED.  The SARS-CoV-2 RNA is generally detectable in upper and lower respiratory specimens during the acute phase of infection. The lowest concentration of SARS-CoV-2 viral copies this assay can detect is 250 copies / mL. A negative result does not preclude SARS-CoV-2 infection and should not be used as the sole basis for treatment or other patient management decisions.  A negative result may occur with improper specimen collection / handling, submission of specimen other than nasopharyngeal swab, presence of viral mutation(s) within the areas targeted by this assay, and inadequate number of viral copies (<250 copies / mL). A negative result must be combined with clinical observations, patient history, and epidemiological information.  Fact Sheet for Patients:   StrictlyIdeas.no  Fact Sheet for Healthcare Providers: BankingDealers.co.za  This test is not yet approved or  cleared by the [place] FDA and has been authorized for detection and/or diagnosis of SARS-CoV-2  by FDA under an Emergency Use Authorization (EUA).  This EUA will remain in effect (meaning this test can be used) for the duration of the COVID-19 declaration under Section 564(b)(1) of the Act, 21 U.S.C. section 360bbb-3(b)(1), unless the authorization is terminated or revoked sooner.  Performed at [facility], [place] [address], [place], [place] [address]   Urine culture     Status: Abnormal   Collection Time: 09/08/19 10:22 PM   Specimen: Urine, Random  Result Value Ref Range Status   Specimen Description URINE, RANDOM  Final   Special Requests NONE  Final   Culture (A)  Final    <10,000 COLONIES/mL INSIGNIFICANT GROWTH Performed at [facility], [place] [address], [place],  [address]    Report Status 09/10/2019 FINAL  Final     Time coordinating discharge: Over 30 minutes  SIGNED:   Darliss Cheney, MD  [facility] 09/17/2019, 11:20 AM  If 7PM-7AM, please contact night-coverage [email or web address]

## 2019-09-17 NOTE — Progress Notes (Signed)
[facility] Progress Note  Subjective: seen in room, no c/o, states that doesn't need SNF and is going home today  Vitals:   09/16/19 1951 09/17/19 0705 09/17/19 0803 09/17/19 0901  BP: (!) 156/92 (!) 162/114  119/71  Pulse: 77 79    Resp: 18 19    Temp: 98.2 F (36.8 C) 98.2 F (36.8 C)    TempSrc: Oral Oral    SpO2: 97%  96%   Weight:      Height:        Exam: NAD nasal O2 RRR no RG Chest cta bilat  Abd soft ntnd  Ext no edema  Nonfocal, Ox 3   TDC in chest, LUA wounds are new    OP HD: TTS [facility]  4h  60kg  2/2.25 bath  TDC  Hep 4000   Calc 0.25 tiw   venofer 100 x 10   Assessment/ Plan: 1. Severe hyperkalemia: Due to missed dialysis treatment.  Required urgent dialysis on 7/5. Resolved.  2. SOB/ hypoxemic resp failure: no edema on exam and CXR normal. Sp bronchodilators per primary team. Under dry wt by 2kg. Resolved.  3. ESRD: HD TTS. Outpt HD tomorrow 4. HD access: sp new LUA 1st stage BVT on 7/12, will f/u w/ [facility] 5. Anemia of ESRD: Hemoglobin at goal.  Monitor. 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  Started calcium acetate.   7. HTN/volume: Blood pressure and volume status acceptable.  Continue current antihypertensives.  Monitor BP. No edema on exam.  8. Seizure disorder: CT head with no acute finding.  Per primary team. 9. Dispo - dc to home today     Ralph Becker 09/17/2019, 1:49 PM   Recent Labs  Lab 09/11/19 1109 09/12/19 0404 09/15/19 0358 09/15/19 0358 09/16/19 0305 09/17/19 1011  K  --    < > 3.9   < > 4.2 3.5  BUN  --    < > 37*   < > 52* 35*  CREATININE  --    < > 5.68*   < > 7.41* 5.32*  CALCIUM  --    < > 8.7*   < > 8.7* 8.4*  PHOS  --    < > 4.4   < > 5.7* 2.6  HGB 10.2*  --  8.8*  --   --   --    < > = values in this interval not displayed.   Inpatient medications:  atorvastatin  40 mg Oral Daily   budesonide  0.5 mg Inhalation BID   calcitRIOL  0.25 mcg Oral Q T,Th,Sat-1800   calcium acetate  1,334 mg Oral  TID WC   calcium carbonate  1 tablet Oral BID WC   Chlorhexidine Gluconate Cloth  6 each Topical Q0600   Chlorhexidine Gluconate Cloth  6 each Topical Q0600   Chlorhexidine Gluconate Cloth  6 each Topical Q0600   Chlorhexidine Gluconate Cloth  6 each Topical Q0600   clonazePAM  1 mg Oral QHS   gabapentin  300 mg Oral Daily   heparin  5,000 Units Subcutaneous Q8H    acetaminophen **OR** acetaminophen, hydrALAZINE, ipratropium-albuterol, ondansetron **OR** ondansetron (ZOFRAN) IV, oxyCODONE, polyethylene glycol

## 2019-09-18 ENCOUNTER — Telehealth: Payer: Self-pay | Admitting: Nurse Practitioner

## 2019-09-18 NOTE — Telephone Encounter (Signed)
Transition of care contact from inpatient facility  Date of Discharge: 09/17/2019 Date of Contact: 09/18/2019 Method of contact: Phone  Attempted to contact patient to discuss transition of care from inpatient admission. Patient did not answer the phone. Message was left on the patient's voicemail with call back number [phone].

## 2019-09-19 ENCOUNTER — Telehealth: Payer: Self-pay | Admitting: Nephrology

## 2019-09-19 NOTE — Telephone Encounter (Signed)
Transition of care contact from inpatient facility  Date of Discharge:  09/17/19 Date of Contact: 09/19/19 --attempt  Method of contact: Phone  Attempted to contact patient to discuss transition of care from inpatient admission. Patient did not answer the phone and unable to leave voicemail.

## 2019-10-10 ENCOUNTER — Other Ambulatory Visit: Payer: Self-pay

## 2019-10-10 DIAGNOSIS — Z992 Dependence on renal dialysis: Secondary | ICD-10-CM

## 2019-10-28 ENCOUNTER — Inpatient Hospital Stay (HOSPITAL_COMMUNITY): Admit: 2019-10-28 | Payer: Medicaid Other

## 2019-11-09 ENCOUNTER — Emergency Department (HOSPITAL_COMMUNITY): Payer: Medicaid Other

## 2019-11-09 ENCOUNTER — Inpatient Hospital Stay (HOSPITAL_COMMUNITY)
Admission: EM | Admit: 2019-11-09 | Discharge: 2019-11-20 | DRG: 280 | Disposition: A | Discharge status: Hospice | Payer: Medicaid Other | Attending: Family Medicine | Admitting: Family Medicine

## 2019-11-09 ENCOUNTER — Encounter (HOSPITAL_COMMUNITY): Payer: Self-pay

## 2019-11-09 DIAGNOSIS — N186 End stage renal disease: Secondary | ICD-10-CM | POA: Diagnosis present

## 2019-11-09 DIAGNOSIS — R402313 Coma scale, best motor response, none, at hospital admission: Secondary | ICD-10-CM | POA: Diagnosis present

## 2019-11-09 DIAGNOSIS — K7201 Acute and subacute hepatic failure with coma: Secondary | ICD-10-CM | POA: Diagnosis present

## 2019-11-09 DIAGNOSIS — R402113 Coma scale, eyes open, never, at hospital admission: Secondary | ICD-10-CM | POA: Diagnosis present

## 2019-11-09 DIAGNOSIS — E162 Hypoglycemia, unspecified: Secondary | ICD-10-CM | POA: Diagnosis present

## 2019-11-09 DIAGNOSIS — R0902 Hypoxemia: Secondary | ICD-10-CM

## 2019-11-09 DIAGNOSIS — G40909 Epilepsy, unspecified, not intractable, without status epilepticus: Secondary | ICD-10-CM | POA: Diagnosis present

## 2019-11-09 DIAGNOSIS — E43 Unspecified severe protein-calorie malnutrition: Secondary | ICD-10-CM | POA: Diagnosis present

## 2019-11-09 DIAGNOSIS — J14 Pneumonia due to Hemophilus influenzae: Secondary | ICD-10-CM | POA: Diagnosis not present

## 2019-11-09 DIAGNOSIS — D65 Disseminated intravascular coagulation [defibrination syndrome]: Secondary | ICD-10-CM | POA: Diagnosis present

## 2019-11-09 DIAGNOSIS — F17200 Nicotine dependence, unspecified, uncomplicated: Secondary | ICD-10-CM | POA: Diagnosis present

## 2019-11-09 DIAGNOSIS — R54 Age-related physical debility: Secondary | ICD-10-CM | POA: Diagnosis present

## 2019-11-09 DIAGNOSIS — B182 Chronic viral hepatitis C: Secondary | ICD-10-CM | POA: Diagnosis present

## 2019-11-09 DIAGNOSIS — T8241XA Breakdown (mechanical) of vascular dialysis catheter, initial encounter: Secondary | ICD-10-CM

## 2019-11-09 DIAGNOSIS — Z9289 Personal history of other medical treatment: Secondary | ICD-10-CM

## 2019-11-09 DIAGNOSIS — J9601 Acute respiratory failure with hypoxia: Secondary | ICD-10-CM | POA: Diagnosis present

## 2019-11-09 DIAGNOSIS — A419 Sepsis, unspecified organism: Secondary | ICD-10-CM | POA: Diagnosis not present

## 2019-11-09 DIAGNOSIS — Z01818 Encounter for other preprocedural examination: Secondary | ICD-10-CM

## 2019-11-09 DIAGNOSIS — I120 Hypertensive chronic kidney disease with stage 5 chronic kidney disease or end stage renal disease: Secondary | ICD-10-CM | POA: Diagnosis present

## 2019-11-09 DIAGNOSIS — E782 Mixed hyperlipidemia: Secondary | ICD-10-CM | POA: Diagnosis present

## 2019-11-09 DIAGNOSIS — J440 Chronic obstructive pulmonary disease with acute lower respiratory infection: Secondary | ICD-10-CM | POA: Diagnosis present

## 2019-11-09 DIAGNOSIS — I469 Cardiac arrest, cause unspecified: Secondary | ICD-10-CM | POA: Diagnosis present

## 2019-11-09 DIAGNOSIS — K759 Inflammatory liver disease, unspecified: Secondary | ICD-10-CM

## 2019-11-09 DIAGNOSIS — I214 Non-ST elevation (NSTEMI) myocardial infarction: Principal | ICD-10-CM | POA: Diagnosis present

## 2019-11-09 DIAGNOSIS — I4892 Unspecified atrial flutter: Secondary | ICD-10-CM | POA: Diagnosis present

## 2019-11-09 DIAGNOSIS — E875 Hyperkalemia: Secondary | ICD-10-CM

## 2019-11-09 DIAGNOSIS — J189 Pneumonia, unspecified organism: Secondary | ICD-10-CM

## 2019-11-09 DIAGNOSIS — E872 Acidosis: Secondary | ICD-10-CM | POA: Diagnosis present

## 2019-11-09 DIAGNOSIS — K7581 Nonalcoholic steatohepatitis (NASH): Secondary | ICD-10-CM | POA: Diagnosis present

## 2019-11-09 DIAGNOSIS — Z20822 Contact with and (suspected) exposure to covid-19: Secondary | ICD-10-CM | POA: Diagnosis present

## 2019-11-09 DIAGNOSIS — Z880 Allergy status to penicillin: Secondary | ICD-10-CM

## 2019-11-09 DIAGNOSIS — Z7189 Other specified counseling: Secondary | ICD-10-CM

## 2019-11-09 DIAGNOSIS — Z681 Body mass index (BMI) 19 or less, adult: Secondary | ICD-10-CM

## 2019-11-09 DIAGNOSIS — R627 Adult failure to thrive: Secondary | ICD-10-CM

## 2019-11-09 DIAGNOSIS — Z66 Do not resuscitate: Secondary | ICD-10-CM

## 2019-11-09 DIAGNOSIS — I472 Ventricular tachycardia: Secondary | ICD-10-CM | POA: Diagnosis present

## 2019-11-09 DIAGNOSIS — E8770 Fluid overload, unspecified: Secondary | ICD-10-CM | POA: Diagnosis present

## 2019-11-09 DIAGNOSIS — J690 Pneumonitis due to inhalation of food and vomit: Secondary | ICD-10-CM | POA: Diagnosis present

## 2019-11-09 DIAGNOSIS — Z515 Encounter for palliative care: Secondary | ICD-10-CM

## 2019-11-09 DIAGNOSIS — Z79899 Other long term (current) drug therapy: Secondary | ICD-10-CM

## 2019-11-09 DIAGNOSIS — Z992 Dependence on renal dialysis: Secondary | ICD-10-CM

## 2019-11-09 DIAGNOSIS — I4891 Unspecified atrial fibrillation: Secondary | ICD-10-CM | POA: Diagnosis present

## 2019-11-09 DIAGNOSIS — R6521 Severe sepsis with septic shock: Secondary | ICD-10-CM | POA: Diagnosis not present

## 2019-11-09 DIAGNOSIS — I4901 Ventricular fibrillation: Secondary | ICD-10-CM | POA: Diagnosis present

## 2019-11-09 DIAGNOSIS — R402213 Coma scale, best verbal response, none, at hospital admission: Secondary | ICD-10-CM | POA: Diagnosis present

## 2019-11-09 DIAGNOSIS — I358 Other nonrheumatic aortic valve disorders: Secondary | ICD-10-CM | POA: Diagnosis present

## 2019-11-09 DIAGNOSIS — F1110 Opioid abuse, uncomplicated: Secondary | ICD-10-CM | POA: Diagnosis present

## 2019-11-09 LAB — I-STAT ARTERIAL BLOOD GAS, ED
Acid-base deficit: 13 mmol/L — ABNORMAL HIGH (ref 0.0–2.0)
Bicarbonate: 15.2 mmol/L — ABNORMAL LOW (ref 20.0–28.0)
Calcium, Ion: 1.01 mmol/L — ABNORMAL LOW (ref 1.15–1.40)
HCT: 43 % (ref 39.0–52.0)
Hemoglobin: 14.6 g/dL (ref 13.0–17.0)
O2 Saturation: 100 %
Patient temperature: 93
Potassium: 6.5 mmol/L (ref 3.5–5.1)
Sodium: 134 mmol/L — ABNORMAL LOW (ref 135–145)
TCO2: 17 mmol/L — ABNORMAL LOW (ref 22–32)
pCO2 arterial: 38.1 mmHg (ref 32.0–48.0)
pH, Arterial: 7.191 — CL (ref 7.350–7.450)
pO2, Arterial: 320 mmHg — ABNORMAL HIGH (ref 83.0–108.0)

## 2019-11-09 LAB — SARS CORONAVIRUS 2 BY RT PCR (HOSPITAL ORDER, PERFORMED IN ~~LOC~~ HOSPITAL LAB): SARS Coronavirus 2: NEGATIVE

## 2019-11-09 MED ORDER — CALCIUM GLUCONATE-NACL 1-0.675 GM/50ML-% IV SOLN
1.0000 g | Freq: Once | INTRAVENOUS | Status: AC
  Administered 2019-11-10: 1000 mg via INTRAVENOUS
  Filled 2019-11-09: qty 50

## 2019-11-09 MED ORDER — INSULIN ASPART 100 UNIT/ML IV SOLN
10.0000 [IU] | Freq: Once | INTRAVENOUS | Status: AC
  Administered 2019-11-10: 10 [IU] via INTRAVENOUS

## 2019-11-09 MED ORDER — FENTANYL CITRATE (PF) 100 MCG/2ML IJ SOLN: 100.0000 ug | INTRAMUSCULAR | Status: DC | PRN

## 2019-11-09 MED ORDER — SODIUM CHLORIDE 0.9 % IV SOLN
20.0000 mg/kg | Freq: Once | INTRAVENOUS | Status: AC
  Administered 2019-11-10: 1200 mg via INTRAVENOUS
  Filled 2019-11-09: qty 12

## 2019-11-09 MED ORDER — PROPOFOL 1000 MG/100ML IV EMUL
0.0000 ug/kg/min | INTRAVENOUS | Status: DC
  Administered 2019-11-09: 25 ug/kg/min via INTRAVENOUS
  Administered 2019-11-10: 5 ug/kg/min via INTRAVENOUS
  Administered 2019-11-10: 20 ug/kg/min via INTRAVENOUS
  Administered 2019-11-11: 10 ug/kg/min via INTRAVENOUS
  Filled 2019-11-09 (×3): qty 100

## 2019-11-09 MED ORDER — DEXTROSE 50 % IV SOLN
1.0000 | Freq: Once | INTRAVENOUS | Status: AC
  Administered 2019-11-10: 50 mL via INTRAVENOUS
  Filled 2019-11-09: qty 50

## 2019-11-09 MED ORDER — ASPIRIN 81 MG PO CHEW
324.0000 mg | CHEWABLE_TABLET | Freq: Once | ORAL | Status: DC
  Filled 2019-11-09: qty 4

## 2019-11-09 MED ORDER — PROPOFOL 1000 MG/100ML IV EMUL
INTRAVENOUS | Status: AC
  Administered 2019-11-09: 5 ug/kg/min via INTRAVENOUS
  Filled 2019-11-09: qty 100

## 2019-11-09 MED ORDER — PANTOPRAZOLE SODIUM 40 MG IV SOLR
40.0000 mg | Freq: Once | INTRAVENOUS | Status: AC
  Administered 2019-11-10: 40 mg via INTRAVENOUS
  Filled 2019-11-09: qty 40

## 2019-11-09 MED ORDER — PROPOFOL 1000 MG/100ML IV EMUL: 0.0000 ug/kg/min | INTRAVENOUS | Status: DC

## 2019-11-09 NOTE — ED Provider Notes (Addendum)
I saw and evaluated the patient, reviewed the resident's note and I agree with the findings and plan.  EKG: EKG Interpretation  Date/Time:  Sunday November 09 2019 22:23:02 EDT Ventricular Rate:  84 PR Interval:    QRS Duration: 140 QT Interval:  388 QTC Calculation: 462 R Axis:   -123 Text Interpretation: Sinus rhythm Borderline prolonged PR interval Biatrial enlargement Right bundle branch block Confirmed by Lacretia Leigh ([id number]) on 11/09/2019 10:34:64 PM   59 year old male arrives post cardiac arrest.  Patient had received ACLS protocol for approximately 10 minutes prior to arrival.  He had a downtime of approximately 5 minutes.  His airway was secured.  On arrival here patient is unresponsive with some decerebrate posturing.  Patient is EKG without evidence of STEMI.  Patient to have head CT as well as labs.  Will be admitted pending his work-up  CRITICAL CARE Performed by: Leota Jacobsen Total critical care time: 45 minutes Critical care time was exclusive of separately billable procedures and treating other patients. Critical care was necessary to treat or prevent imminent or life-threatening deterioration. Critical care was time spent personally by me on the following activities: development of treatment plan with patient and/or surrogate as well as nursing, discussions with consultants, evaluation of patient's response to treatment, examination of patient, obtaining history from patient or surrogate, ordering and performing treatments and interventions, ordering and review of laboratory studies, ordering and review of radiographic studies, pulse oximetry and re-evaluation of patient's condition.   Lacretia Leigh, MD 11/09/19 Janelle Floor    Lacretia Leigh, MD 11/24/19 [phone]

## 2019-11-09 NOTE — ED Provider Notes (Signed)
[facility] EMERGENCY DEPARTMENT Provider Note   CSN: [id number] Arrival date & time:        History Chief Complaint  Patient presents with   Post CPR    Ralph Becker is a 59 y.o. male past medical history of hepatitis C, COPD, hypertension, hyperlipidemia, seizure disorder, ESRD [place] dialyzed Tuesday/Thursday/Saturday via right chest permacath presents to the ED after being found unresponsive at what was reported to be unknown drug house.  Reportedly witnessed 5 minutes prior to being found down, uncertain circumstances.  Unknown bystande CPR.  On EMS arrival in asystole, received multiple rounds of compressions, about 10-12 in total, converting to V. fib followed by V. tach for which she was shocked twice, received epi x1, bicarb x1, 2 g of calcium.  Patient intubated with 50 mcg of fentanyl only.  Hypertensive and mildly tachycardic.  No further sedation administered by EMS and no paralytics.  The history is provided by the EMS personnel.  Cardiac Arrest Witnessed by:  Not witnessed Incident location:  Home Time since incident:  30 minutes Condition upon EMS arrival:  Unresponsive Pulse:  Absent Initial cardiac rhythm per EMS:  Asystole Treatments prior to arrival:  ACLS protocol, AED discharged and intubation Medications given prior to ED:  Epinephrine, lidocaine and sodium bicarbonate Number of shocks delivered:  2 Defibrillation successful: yes   Airway:  Intubation prior to arrival Rhythm on admission to ED:  Normal sinus      Past Medical History:  Diagnosis Date   Chronic hepatitis C (HCC)    COPD (chronic obstructive pulmonary disease) ([place]) 09/08/2019   ESRD (end stage renal disease) ([place])    HD Tues, Thurs, Sat   Essential hypertension    Mixed hyperlipidemia    Nicotine dependence, cigarettes, uncomplicated    Seizure disorder ([place]) 09/08/2019    Patient Active Problem List   Diagnosis Date Noted   Hyperkalemia 09/08/2019    Seizure disorder ([place]) 51/70/0174   Acute metabolic encephalopathy 94/49/6759   Hypertensive urgency 09/08/2019   Seizure ([place]) 09/08/2019   COPD (chronic obstructive pulmonary disease) ([place]) 09/08/2019   Mixed hyperlipidemia    Essential hypertension    Chronic hepatitis C without hepatic coma ([place]) 08/21/2019   DDD (degenerative disc disease), lumbar 08/21/2019   Fixation hardware in spine 08/21/2019   Gallstones 08/21/2019   Hepatic steatosis 08/21/2019   Pneumonia due to gram-negative bacteria ([place]) 08/21/2019   Severe protein-calorie malnutrition ([place]) 08/21/2019   Stage 5 chronic kidney disease on chronic dialysis ([place]) 08/21/2019   Acute renal failure superimposed on chronic kidney disease, on chronic dialysis ([place]) 08/10/2019   AKI (acute kidney injury) ([place]) 06/02/2019   Nicotine dependence 06/02/2019    Past Surgical History:  Procedure Laterality Date   AV FISTULA PLACEMENT Left 09/15/2019   Procedure: ARTERIOVENOUS (AV) FISTULA CREATION LEFT ARM;  Surgeon: [name], MD;  Location: [facility] OR;  Service: Vascular;  Laterality: Left;       Family History  Family history unknown: Yes    Social History   Tobacco Use   Smoking status: Current Every Day Smoker   Smokeless tobacco: Never Used  Substance Use Topics   Alcohol use: Never   Drug use: Never    Home Medications Prior to Admission medications   Medication Sig Start Date End Date Taking? Authorizing Provider  atorvastatin (LIPITOR) 40 MG tablet Take 40 mg by mouth daily. 08/08/19   [provider]  clonazePAM (KLONOPIN) 1 MG tablet Take 1 mg  by mouth at bedtime. 07/11/19   [provider]  FLOVENT HFA 220 MCG/ACT inhaler Inhale 2 puffs into the lungs every 12 (twelve) hours. 07/11/19   [provider]  gabapentin (NEURONTIN) 300 MG capsule Take 300 mg by mouth daily. 06/06/19   [provider]  hydrALAZINE (APRESOLINE) 50 MG tablet Take 50 mg by mouth in  the morning and at bedtime.  08/23/19   [provider]  metoprolol tartrate (LOPRESSOR) 25 MG tablet Take 25 mg by mouth 2 (two) times daily. 08/08/19   [provider]  sodium bicarbonate 650 MG tablet Take 650 mg by mouth 2 (two) times daily.  06/06/19   [provider]  SUBOXONE 8-2 MG FILM Place 1 Film under the tongue daily. 08/23/19   [provider]  tamsulosin (FLOMAX) 0.4 MG CAPS capsule Take 0.4 mg by mouth at bedtime. 06/26/19   [provider]    Allergies    Penicillins  Review of Systems   Review of Systems  Unable to perform ROS: Intubated    Physical Exam Updated Vital Signs BP 121/86    Pulse 74    Temp (!) 93 F (33.9 C)    Resp (!) 28    Ht 5\' 9"  (1.753 m)    Wt 60.8 kg    SpO2 100%    BMI 19.79 kg/m   Physical Exam Constitutional:      Appearance: He is ill-appearing.     Comments: Intubated, mechanically ventilated.  Decerebrate positioning  HENT:     Head: Normocephalic and atraumatic.     Mouth/Throat:     Mouth: Mucous membranes are moist.     Pharynx: Oropharynx is clear.  Eyes:     General: No scleral icterus.    Pupils: Pupils are equal, round, and reactive to light.  Cardiovascular:     Rate and Rhythm: Regular rhythm. Tachycardia present.     Pulses: Normal pulses.     Comments: Right chest permacath benign-appearing Pulmonary:     Effort: Pulmonary effort is normal. No respiratory distress.  Abdominal:     General: There is no distension.     Tenderness: There is no abdominal tenderness.  Musculoskeletal:        General: No tenderness or deformity.     Cervical back: Normal range of motion and neck supple.  Skin:    Comments: Track marks to left [facility]  Neurological:     Comments: Unresponsive, decerebrate posturing to pain throughout     ED Results / Procedures / Treatments   Labs (all labs ordered are listed, but only abnormal results are displayed) Labs Reviewed  CBC - Abnormal; Notable for the  following components:      Result Value   MCV 100.2 (*)    MCHC 29.1 (*)    RDW 16.7 (*)    nRBC 0.4 (*)    All other components within normal limits  APTT - Abnormal; Notable for the following components:   aPTT 40 (*)    All other components within normal limits  PROTIME-INR - Abnormal; Notable for the following components:   Prothrombin Time 29.1 (*)    INR 2.9 (*)    All other components within normal limits  I-STAT ARTERIAL BLOOD GAS, ED - Abnormal; Notable for the following components:   pH, Arterial 7.191 (*)    pO2, Arterial 320 (*)    Bicarbonate 15.2 (*)    TCO2 17 (*)    Acid-base deficit 13.0 (*)  Sodium 134 (*)    Potassium 6.5 (*)    Calcium, Ion 1.01 (*)    All other components within normal limits  SARS CORONAVIRUS 2 BY RT PCR (HOSPITAL ORDER, [facility])  CULTURE, BLOOD (SINGLE)  URINE CULTURE  COMPREHENSIVE METABOLIC PANEL  MAGNESIUM  BRAIN NATRIURETIC PEPTIDE  TRIGLYCERIDES  BLOOD GAS, ARTERIAL  LACTIC ACID, PLASMA  LACTIC ACID, PLASMA  URINALYSIS, ROUTINE W REFLEX MICROSCOPIC  I-STAT CHEM 8, ED  CBG MONITORING, ED  TROPONIN I (HIGH SENSITIVITY)  TROPONIN I (HIGH SENSITIVITY)    EKG EKG Interpretation  Date/Time:  Sunday November 09 2019 22:23:02 EDT Ventricular Rate:  84 PR Interval:    QRS Duration: 140 QT Interval:  388 QTC Calculation: 462 R Axis:   -123 Text Interpretation: Sinus rhythm Borderline prolonged PR interval Biatrial enlargement Right bundle branch block Confirmed by Lacretia Leigh ([id number]) on 11/09/2019 10:34:01 PM   Radiology CT Head Wo Contrast  Result Date: 11/09/2019 CLINICAL DATA:  Patient was found unresponsive at home. CPR. History of seizures, chronic hepatitis C, hypertension. EXAM: CT HEAD WITHOUT CONTRAST CT CERVICAL SPINE WITHOUT CONTRAST TECHNIQUE: Multidetector CT imaging of the head and cervical spine was performed following the standard protocol without intravenous contrast.  Multiplanar CT image reconstructions of the cervical spine were also generated. COMPARISON:  CT head 09/08/2019 FINDINGS: CT HEAD FINDINGS Brain: Motion artifact limits examination. Ventricles and sulci are symmetrical. No ventricular dilatation. No mass effect or midline shift. No abnormal extra-axial fluid collections. Gray-white matter junctions are distinct. Basal cisterns are not effaced. No acute intracranial hemorrhage. Vascular: Intracranial arterial vascular calcifications are present. Skull: The calvarium appears intact. Sinuses/Orbits: Small air-fluid levels in the maxillary antra bilaterally. Opacification of multiple ethmoid air cells. Mastoid air cells are clear. Other: OG and endotracheal tubes are present. CT CERVICAL SPINE FINDINGS Alignment: Motion artifact limits examination. Alignment appears normal. C1-2 articulation appears intact. Skull base and vertebrae: No vertebral compression deformities. No focal bone lesion or bone destruction. Postoperative fixation is noted in the upper thoracic spine, incompletely included. Soft tissues and spinal canal: No prevertebral fluid or swelling. No visible canal hematoma. Disc levels: Degenerative changes throughout the cervical spine with narrowed interspaces and endplate hypertrophic change. Upper chest: Motion artifact limits examination but there appears to be patchy infiltration in the lung apices with probable bronchiectasis. Consider pneumonia or edema. Aspiration could also have this appearance. Other: Enteric and endotracheal tubes are present. IMPRESSION: 1. No acute intracranial abnormalities. 2. Normal alignment of the cervical spine. Degenerative changes. No acute displaced fractures identified. 3. Patchy infiltration in the lung apices with probable bronchiectasis. Consider pneumonia or edema. Aspiration could also have this appearance. Electronically Signed   By: Lucienne Capers M.D.   On: 11/09/2019 23:36   CT Cervical Spine Wo  Contrast  Result Date: 11/09/2019 CLINICAL DATA:  Patient was found unresponsive at home. CPR. History of seizures, chronic hepatitis C, hypertension. EXAM: CT HEAD WITHOUT CONTRAST CT CERVICAL SPINE WITHOUT CONTRAST TECHNIQUE: Multidetector CT imaging of the head and cervical spine was performed following the standard protocol without intravenous contrast. Multiplanar CT image reconstructions of the cervical spine were also generated. COMPARISON:  CT head 09/08/2019 FINDINGS: CT HEAD FINDINGS Brain: Motion artifact limits examination. Ventricles and sulci are symmetrical. No ventricular dilatation. No mass effect or midline shift. No abnormal extra-axial fluid collections. Gray-white matter junctions are distinct. Basal cisterns are not effaced. No acute intracranial hemorrhage. Vascular: Intracranial arterial vascular calcifications are present. Skull: The  calvarium appears intact. Sinuses/Orbits: Small air-fluid levels in the maxillary antra bilaterally. Opacification of multiple ethmoid air cells. Mastoid air cells are clear. Other: OG and endotracheal tubes are present. CT CERVICAL SPINE FINDINGS Alignment: Motion artifact limits examination. Alignment appears normal. C1-2 articulation appears intact. Skull base and vertebrae: No vertebral compression deformities. No focal bone lesion or bone destruction. Postoperative fixation is noted in the upper thoracic spine, incompletely included. Soft tissues and spinal canal: No prevertebral fluid or swelling. No visible canal hematoma. Disc levels: Degenerative changes throughout the cervical spine with narrowed interspaces and endplate hypertrophic change. Upper chest: Motion artifact limits examination but there appears to be patchy infiltration in the lung apices with probable bronchiectasis. Consider pneumonia or edema. Aspiration could also have this appearance. Other: Enteric and endotracheal tubes are present. IMPRESSION: 1. No acute intracranial  abnormalities. 2. Normal alignment of the cervical spine. Degenerative changes. No acute displaced fractures identified. 3. Patchy infiltration in the lung apices with probable bronchiectasis. Consider pneumonia or edema. Aspiration could also have this appearance. Electronically Signed   By: Lucienne Capers M.D.   On: 11/09/2019 23:36   DG Chest Portable 1 View  Result Date: 11/09/2019 CLINICAL DATA:  Intubated.  Status post CPR. EXAM: PORTABLE CHEST 1 VIEW COMPARISON:  Radiograph 09/10/2019 FINDINGS: The endotracheal tube tip is difficult to visualize but likely at the level of the clavicular heads. Enteric tube is in place with tip and side-port below the diaphragm in the stomach. Unchanged appearance of right internal jugular dialysis catheter with tip at the atrial caval junction. Mild cardiomegaly with unchanged mediastinal contours. There is increasing peribronchial and interstitial thickening. No pneumothorax or large pleural effusion. No confluent airspace disease. Multiple remote right rib fractures. No acute osseous abnormalities are seen. IMPRESSION: 1. Endotracheal tube tip is difficult to visualize but likely at the level of the clavicular heads. 2. Enteric tube in place with tip and side-port below the diaphragm in the stomach. 3. Increasing peribronchial and interstitial thickening, may be pulmonary edema or atypical infection. Electronically Signed   By: Keith Rake M.D.   On: 11/09/2019 22:37    Procedures Procedures (including critical care time)  Medications Ordered in ED Medications  aspirin chewable tablet 324 mg (has no administration in time range)  fentaNYL (SUBLIMAZE) injection 100 mcg (has no administration in time range)  fentaNYL (SUBLIMAZE) injection 100 mcg (has no administration in time range)  propofol (DIPRIVAN) 1000 MG/100ML infusion (40 mcg/kg/min  60.8 kg Intravenous Rate/Dose Change 11/09/19 2340)  calcium gluconate 1 g/ 50 mL sodium chloride IVPB (1,000 mg  Intravenous New Bag/Given 11/10/19 0033)  fentaNYL 2550mcg in NS 260mL (9mcg/ml) infusion-PREMIX (25 mcg/hr Intravenous New Bag/Given 11/10/19 0040)  levETIRAcetam (KEPPRA) 1,200 mg in sodium chloride 0.9 % 100 mL IVPB (0 mg/kg  60 kg (Order-Specific) Intravenous Stopped 11/10/19 0028)  pantoprazole (PROTONIX) injection 40 mg (40 mg Intravenous Given 11/10/19 0028)  insulin aspart (novoLOG) injection 10 Units (10 Units Intravenous Given 11/10/19 0021)    And  dextrose 50 % solution 50 mL (50 mLs Intravenous Given 11/10/19 0024)    ED Course  I have reviewed the triage vital signs and the nursing notes.  Pertinent labs & imaging results that were available during my care of the patient were reviewed by me and considered in my medical decision making (see chart for details).    MDM Rules/Calculators/A&P  EKG findings by my read: Compared to prior: 09/08/2019.  Rate: 84 rhythm: sinus Axis: Extreme left axis deviation PR: 225 QRS: 140 QTc: 388.  Limited by severe background noise, however grossly no evidence of ischemia or arrhythmia, nor any other pathologic findings concerning considering patient presentation. Findings discussed with attending who agrees.  Differential diagnosis considered: ACS, PE, respiratory arrest, hyperkalemia, hypoxic brain injury  Patient presenting for cardiac arrest with initial rhythm asystole, unclear downtime however last seen 5 minutes reportedly prior to being found down, additional collateral history obtained from the patient's friend and primary caretaker Andria Meuse who states the patient did miss dialysis yesterday, no known drug use, has been feeling "weak" of the last couple days  On arrival the patient noted to be intermittently tachypneic, some decerebrate posturing noted though no frank seizure-like activity.  Per her caretaker he does not take any medications for his seizures and I am unsure if this represents pseudoseizures  EKG as  above not grossly ischemic, will follow up troponins  CT head and C-spine obtained, without obvious acute pathology.  Critical care consulted for admission, patient accepted to the MICU  Final Clinical Impression(s) / ED Diagnoses Final diagnoses:  Cardiac arrest ([place])  Hyperkalemia    Rx / DC Orders ED Discharge Orders    None     Labs, studies and imaging reviewed by myself and considered in medical decision making if ordered. Imaging interpreted by radiology. Pt was discussed with my attending, Dr. Zenia Resides  Electronically signed by:  Roderic Palau [place]9/6/202112:42 AM       Renold Genta, MD 11/10/19 3810    Lacretia Leigh, MD 11/11/19 1104

## 2019-11-09 NOTE — ED Triage Notes (Signed)
Pt BIB [facility] s/p cardiac arrest. Pt was found unresponsive at home, estimated down time prior to being found 5 minutes. Asystole on EMS arrival, 4 minutes CPR, V Fib, shocked. Pt then went into vtach, shocked again. EMS obtained ROSC after about 10-12 minutes of CPR. Intubated w/ 7.0 ETT in the field. Pt rec'd Epi x 1, 100mg  Lido&Lido drip, Bicarb x 1, 2g calcium gluconate and 50 mcg Fentanyl for intubation. EMS reports found in known drug house, track marks to L arm. Hx of ESRD on dialysis, hep C, hyperkalemia.

## 2019-11-10 ENCOUNTER — Inpatient Hospital Stay (HOSPITAL_COMMUNITY): Payer: Medicaid Other

## 2019-11-10 DIAGNOSIS — E43 Unspecified severe protein-calorie malnutrition: Secondary | ICD-10-CM | POA: Diagnosis present

## 2019-11-10 DIAGNOSIS — R627 Adult failure to thrive: Secondary | ICD-10-CM | POA: Diagnosis not present

## 2019-11-10 DIAGNOSIS — J440 Chronic obstructive pulmonary disease with acute lower respiratory infection: Secondary | ICD-10-CM | POA: Diagnosis present

## 2019-11-10 DIAGNOSIS — Z515 Encounter for palliative care: Secondary | ICD-10-CM | POA: Diagnosis not present

## 2019-11-10 DIAGNOSIS — Z7189 Other specified counseling: Secondary | ICD-10-CM | POA: Diagnosis not present

## 2019-11-10 DIAGNOSIS — R6521 Severe sepsis with septic shock: Secondary | ICD-10-CM | POA: Diagnosis not present

## 2019-11-10 DIAGNOSIS — I469 Cardiac arrest, cause unspecified: Secondary | ICD-10-CM | POA: Diagnosis present

## 2019-11-10 DIAGNOSIS — I4901 Ventricular fibrillation: Secondary | ICD-10-CM | POA: Diagnosis present

## 2019-11-10 DIAGNOSIS — R402113 Coma scale, eyes open, never, at hospital admission: Secondary | ICD-10-CM | POA: Diagnosis present

## 2019-11-10 DIAGNOSIS — J9601 Acute respiratory failure with hypoxia: Secondary | ICD-10-CM | POA: Diagnosis present

## 2019-11-10 DIAGNOSIS — J9602 Acute respiratory failure with hypercapnia: Secondary | ICD-10-CM

## 2019-11-10 DIAGNOSIS — D65 Disseminated intravascular coagulation [defibrination syndrome]: Secondary | ICD-10-CM | POA: Diagnosis present

## 2019-11-10 DIAGNOSIS — R579 Shock, unspecified: Secondary | ICD-10-CM | POA: Diagnosis not present

## 2019-11-10 DIAGNOSIS — N186 End stage renal disease: Secondary | ICD-10-CM | POA: Diagnosis present

## 2019-11-10 DIAGNOSIS — I120 Hypertensive chronic kidney disease with stage 5 chronic kidney disease or end stage renal disease: Secondary | ICD-10-CM | POA: Diagnosis present

## 2019-11-10 DIAGNOSIS — R7401 Elevation of levels of liver transaminase levels: Secondary | ICD-10-CM

## 2019-11-10 DIAGNOSIS — J690 Pneumonitis due to inhalation of food and vomit: Secondary | ICD-10-CM | POA: Diagnosis present

## 2019-11-10 DIAGNOSIS — E872 Acidosis: Secondary | ICD-10-CM

## 2019-11-10 DIAGNOSIS — R402313 Coma scale, best motor response, none, at hospital admission: Secondary | ICD-10-CM | POA: Diagnosis present

## 2019-11-10 DIAGNOSIS — K7201 Acute and subacute hepatic failure with coma: Secondary | ICD-10-CM | POA: Diagnosis present

## 2019-11-10 DIAGNOSIS — I4892 Unspecified atrial flutter: Secondary | ICD-10-CM | POA: Diagnosis present

## 2019-11-10 DIAGNOSIS — J14 Pneumonia due to Hemophilus influenzae: Secondary | ICD-10-CM | POA: Diagnosis not present

## 2019-11-10 DIAGNOSIS — A419 Sepsis, unspecified organism: Secondary | ICD-10-CM | POA: Diagnosis not present

## 2019-11-10 DIAGNOSIS — T8241XS Breakdown (mechanical) of vascular dialysis catheter, sequela: Secondary | ICD-10-CM | POA: Diagnosis not present

## 2019-11-10 DIAGNOSIS — R402213 Coma scale, best verbal response, none, at hospital admission: Secondary | ICD-10-CM | POA: Diagnosis present

## 2019-11-10 DIAGNOSIS — I472 Ventricular tachycardia: Secondary | ICD-10-CM | POA: Diagnosis present

## 2019-11-10 DIAGNOSIS — Z66 Do not resuscitate: Secondary | ICD-10-CM | POA: Diagnosis not present

## 2019-11-10 DIAGNOSIS — I214 Non-ST elevation (NSTEMI) myocardial infarction: Secondary | ICD-10-CM | POA: Diagnosis present

## 2019-11-10 DIAGNOSIS — Z20822 Contact with and (suspected) exposure to covid-19: Secondary | ICD-10-CM | POA: Diagnosis present

## 2019-11-10 DIAGNOSIS — Z681 Body mass index (BMI) 19 or less, adult: Secondary | ICD-10-CM | POA: Diagnosis not present

## 2019-11-10 LAB — CBC
HCT: 44.2 % (ref 39.0–52.0)
HCT: 47 % (ref 39.0–52.0)
Hemoglobin: 13.4 g/dL (ref 13.0–17.0)
Hemoglobin: 13.7 g/dL (ref 13.0–17.0)
MCH: 29 pg (ref 26.0–34.0)
MCH: 29.2 pg (ref 26.0–34.0)
MCHC: 29.1 g/dL — ABNORMAL LOW (ref 30.0–36.0)
MCHC: 30.3 g/dL (ref 30.0–36.0)
MCV: 100.2 fL — ABNORMAL HIGH (ref 80.0–100.0)
MCV: 95.7 fL (ref 80.0–100.0)
Platelets: 118 10*3/uL — ABNORMAL LOW (ref 150–400)
Platelets: 166 10*3/uL (ref 150–400)
RBC: 4.62 MIL/uL (ref 4.22–5.81)
RBC: 4.69 MIL/uL (ref 4.22–5.81)
RDW: 16.6 % — ABNORMAL HIGH (ref 11.5–15.5)
RDW: 16.7 % — ABNORMAL HIGH (ref 11.5–15.5)
WBC: 8.1 10*3/uL (ref 4.0–10.5)
WBC: 8.3 10*3/uL (ref 4.0–10.5)
nRBC: 0.4 % — ABNORMAL HIGH (ref 0.0–0.2)
nRBC: 0.6 % — ABNORMAL HIGH (ref 0.0–0.2)

## 2019-11-10 LAB — I-STAT CHEM 8, ED
BUN: 71 mg/dL — ABNORMAL HIGH (ref 8–23)
Calcium, Ion: 0.94 mmol/L — ABNORMAL LOW (ref 1.15–1.40)
Chloride: 103 mmol/L (ref 98–111)
Creatinine, Ser: 11.6 mg/dL — ABNORMAL HIGH (ref 0.61–1.24)
Glucose, Bld: 80 mg/dL (ref 70–99)
HCT: 46 % (ref 39.0–52.0)
Hemoglobin: 15.6 g/dL (ref 13.0–17.0)
Potassium: 7 mmol/L (ref 3.5–5.1)
Sodium: 136 mmol/L (ref 135–145)
TCO2: 18 mmol/L — ABNORMAL LOW (ref 22–32)

## 2019-11-10 LAB — I-STAT ARTERIAL BLOOD GAS, ED
Acid-base deficit: 10 mmol/L — ABNORMAL HIGH (ref 0.0–2.0)
Bicarbonate: 17.5 mmol/L — ABNORMAL LOW (ref 20.0–28.0)
Calcium, Ion: 1.01 mmol/L — ABNORMAL LOW (ref 1.15–1.40)
HCT: 40 % (ref 39.0–52.0)
Hemoglobin: 13.6 g/dL (ref 13.0–17.0)
O2 Saturation: 100 %
Patient temperature: 92.1
Potassium: 5.7 mmol/L — ABNORMAL HIGH (ref 3.5–5.1)
Sodium: 137 mmol/L (ref 135–145)
TCO2: 19 mmol/L — ABNORMAL LOW (ref 22–32)
pCO2 arterial: 38.7 mmHg (ref 32.0–48.0)
pH, Arterial: 7.244 — ABNORMAL LOW (ref 7.350–7.450)
pO2, Arterial: 212 mmHg — ABNORMAL HIGH (ref 83.0–108.0)

## 2019-11-10 LAB — BASIC METABOLIC PANEL
Anion gap: 24 — ABNORMAL HIGH (ref 5–15)
BUN: 77 mg/dL — ABNORMAL HIGH (ref 8–23)
CO2: 16 mmol/L — ABNORMAL LOW (ref 22–32)
Calcium: 8.1 mg/dL — ABNORMAL LOW (ref 8.9–10.3)
Chloride: 96 mmol/L — ABNORMAL LOW (ref 98–111)
Creatinine, Ser: 11.96 mg/dL — ABNORMAL HIGH (ref 0.61–1.24)
GFR calc Af Amer: 5 mL/min — ABNORMAL LOW (ref >60)
GFR calc non Af Amer: 4 mL/min — ABNORMAL LOW (ref >60)
Glucose, Bld: 87 mg/dL (ref 70–99)
Potassium: 6.1 mmol/L — ABNORMAL HIGH (ref 3.5–5.1)
Sodium: 136 mmol/L (ref 135–145)

## 2019-11-10 LAB — GLUCOSE, CAPILLARY
Glucose-Capillary: 102 mg/dL — ABNORMAL HIGH (ref 70–99)
Glucose-Capillary: 66 mg/dL — ABNORMAL LOW (ref 70–99)
Glucose-Capillary: 69 mg/dL — ABNORMAL LOW (ref 70–99)
Glucose-Capillary: 76 mg/dL (ref 70–99)
Glucose-Capillary: 77 mg/dL (ref 70–99)
Glucose-Capillary: 79 mg/dL (ref 70–99)
Glucose-Capillary: 79 mg/dL (ref 70–99)
Glucose-Capillary: 90 mg/dL (ref 70–99)

## 2019-11-10 LAB — PROTIME-INR
INR: 2.9 — ABNORMAL HIGH (ref 0.8–1.2)
Prothrombin Time: 29.1 seconds — ABNORMAL HIGH (ref 11.4–15.2)

## 2019-11-10 LAB — APTT: aPTT: 40 seconds — ABNORMAL HIGH (ref 24–36)

## 2019-11-10 LAB — COMPREHENSIVE METABOLIC PANEL
ALT: 874 U/L — ABNORMAL HIGH (ref 0–44)
AST: 1742 U/L — ABNORMAL HIGH (ref 15–41)
Albumin: 2.2 g/dL — ABNORMAL LOW (ref 3.5–5.0)
Alkaline Phosphatase: 221 U/L — ABNORMAL HIGH (ref 38–126)
Anion gap: 28 — ABNORMAL HIGH (ref 5–15)
BUN: 74 mg/dL — ABNORMAL HIGH (ref 8–23)
CO2: 15 mmol/L — ABNORMAL LOW (ref 22–32)
Calcium: 8.6 mg/dL — ABNORMAL LOW (ref 8.9–10.3)
Chloride: 99 mmol/L (ref 98–111)
Creatinine, Ser: 12.13 mg/dL — ABNORMAL HIGH (ref 0.61–1.24)
GFR calc Af Amer: 5 mL/min — ABNORMAL LOW (ref >60)
GFR calc non Af Amer: 4 mL/min — ABNORMAL LOW (ref >60)
Glucose, Bld: 88 mg/dL (ref 70–99)
Potassium: 7.4 mmol/L (ref 3.5–5.1)
Sodium: 142 mmol/L (ref 135–145)
Total Bilirubin: 1.6 mg/dL — ABNORMAL HIGH (ref 0.3–1.2)
Total Protein: 6.5 g/dL (ref 6.5–8.1)

## 2019-11-10 LAB — BRAIN NATRIURETIC PEPTIDE: B Natriuretic Peptide: 4105.8 pg/mL — ABNORMAL HIGH (ref 0.0–100.0)

## 2019-11-10 LAB — ECHOCARDIOGRAM COMPLETE
Area-P 1/2: 4.23 cm2
Height: 69 in
S' Lateral: 2.6 cm
Weight: 2151.69 oz

## 2019-11-10 LAB — TROPONIN I (HIGH SENSITIVITY)
Troponin I (High Sensitivity): 392 ng/L (ref <18)
Troponin I (High Sensitivity): 621 ng/L (ref <18)
Troponin I (High Sensitivity): 734 ng/L (ref <18)

## 2019-11-10 LAB — LACTIC ACID, PLASMA
Lactic Acid, Venous: 10.5 mmol/L (ref 0.5–1.9)
Lactic Acid, Venous: 6.7 mmol/L (ref 0.5–1.9)

## 2019-11-10 LAB — MAGNESIUM
Magnesium: 2.5 mg/dL — ABNORMAL HIGH (ref 1.7–2.4)
Magnesium: 2.2 mg/dL (ref 1.7–2.4)

## 2019-11-10 LAB — HEMOGLOBIN A1C
Hgb A1c MFr Bld: 5.3 % (ref 4.8–5.6)
Mean Plasma Glucose: 105.41 mg/dL

## 2019-11-10 LAB — ETHANOL: Alcohol, Ethyl (B): <10 mg/dL (ref <10)

## 2019-11-10 LAB — TRIGLYCERIDES: Triglycerides: 162 mg/dL — ABNORMAL HIGH (ref <150)

## 2019-11-10 LAB — MRSA PCR SCREENING: MRSA by PCR: NEGATIVE

## 2019-11-10 MED ORDER — ORAL CARE MOUTH RINSE
15.0000 mL | OROMUCOSAL | Status: DC
  Administered 2019-11-10 – 2019-11-13 (×32): 15 mL via OROMUCOSAL

## 2019-11-10 MED ORDER — ACETAMINOPHEN 160 MG/5ML PO SOLN: 650.0000 mg | Freq: Four times a day (QID) | ORAL | Status: DC | PRN

## 2019-11-10 MED ORDER — DOCUSATE SODIUM 50 MG/5ML PO LIQD
100.0000 mg | Freq: Two times a day (BID) | ORAL | Status: DC
  Administered 2019-11-10 – 2019-11-11 (×2): 100 mg via ORAL
  Filled 2019-11-10: qty 10

## 2019-11-10 MED ORDER — DEXTROSE 50 % IV SOLN
12.5000 g | INTRAVENOUS | Status: AC
  Administered 2019-11-10: 12.5 g via INTRAVENOUS
  Filled 2019-11-10: qty 50

## 2019-11-10 MED ORDER — SODIUM CHLORIDE 0.9 % IV SOLN
INTRAVENOUS | Status: DC | PRN
  Administered 2019-11-10: 1000 mL via INTRAVENOUS

## 2019-11-10 MED ORDER — CHLORHEXIDINE GLUCONATE CLOTH 2 % EX PADS
6.0000 | MEDICATED_PAD | Freq: Every day | CUTANEOUS | Status: DC
  Administered 2019-11-10 – 2019-11-15 (×4): 6 via TOPICAL

## 2019-11-10 MED ORDER — NOREPINEPHRINE 4 MG/250ML-% IV SOLN
0.0000 ug/min | INTRAVENOUS | Status: DC
  Administered 2019-11-11 (×2): 10 ug/min via INTRAVENOUS
  Filled 2019-11-10 (×3): qty 250

## 2019-11-10 MED ORDER — VITAL 1.5 CAL PO LIQD
1000.0000 mL | ORAL | Status: DC
  Administered 2019-11-11: 1000 mL
  Filled 2019-11-10: qty 1000

## 2019-11-10 MED ORDER — HEPARIN SODIUM (PORCINE) 1000 UNIT/ML IJ SOLN
INTRAMUSCULAR | Status: AC
  Administered 2019-11-10: 2000 [IU] via INTRAVENOUS_CENTRAL
  Filled 2019-11-10: qty 4

## 2019-11-10 MED ORDER — LACTATED RINGERS IV BOLUS: 2000.0000 mL | Freq: Once | INTRAVENOUS | Status: DC

## 2019-11-10 MED ORDER — HEPARIN SODIUM (PORCINE) 1000 UNIT/ML DIALYSIS
2000.0000 [IU] | INTRAMUSCULAR | Status: AC | PRN
  Filled 2019-11-10: qty 2

## 2019-11-10 MED ORDER — INSULIN ASPART 100 UNIT/ML ~~LOC~~ SOLN
0.0000 [IU] | SUBCUTANEOUS | Status: DC
  Administered 2019-11-12: 2 [IU] via SUBCUTANEOUS
  Administered 2019-11-12: 1 [IU] via SUBCUTANEOUS
  Administered 2019-11-12: 2 [IU] via SUBCUTANEOUS

## 2019-11-10 MED ORDER — POLYETHYLENE GLYCOL 3350 17 G PO PACK
17.0000 g | PACK | Freq: Every day | ORAL | Status: DC
  Administered 2019-11-11 – 2019-11-13 (×3): 17 g via ORAL
  Filled 2019-11-10 (×5): qty 1

## 2019-11-10 MED ORDER — ATORVASTATIN CALCIUM 40 MG PO TABS: 40.0000 mg | ORAL_TABLET | Freq: Every day | ORAL | Status: DC

## 2019-11-10 MED ORDER — TAMSULOSIN HCL 0.4 MG PO CAPS
0.4000 mg | ORAL_CAPSULE | Freq: Every day | ORAL | Status: DC
  Administered 2019-11-10: 0.4 mg via ORAL
  Filled 2019-11-10: qty 1

## 2019-11-10 MED ORDER — IPRATROPIUM-ALBUTEROL 0.5-2.5 (3) MG/3ML IN SOLN: 3.0000 mL | Freq: Four times a day (QID) | RESPIRATORY_TRACT | Status: DC | PRN

## 2019-11-10 MED ORDER — PANTOPRAZOLE SODIUM 40 MG IV SOLR
40.0000 mg | Freq: Every day | INTRAVENOUS | Status: DC
  Administered 2019-11-10 – 2019-11-13 (×4): 40 mg via INTRAVENOUS
  Filled 2019-11-10 (×4): qty 40

## 2019-11-10 MED ORDER — CHLORHEXIDINE GLUCONATE 0.12% ORAL RINSE (MEDLINE KIT)
15.0000 mL | Freq: Two times a day (BID) | OROMUCOSAL | Status: DC
  Administered 2019-11-10 – 2019-11-13 (×7): 15 mL via OROMUCOSAL

## 2019-11-10 MED ORDER — FENTANYL 2500MCG IN NS 250ML (10MCG/ML) PREMIX INFUSION
0.0000 ug/h | INTRAVENOUS | Status: DC
  Administered 2019-11-10 (×2): 25 ug/h via INTRAVENOUS
  Administered 2019-11-11: 200 ug/h via INTRAVENOUS
  Administered 2019-11-12: 150 ug/h via INTRAVENOUS
  Administered 2019-11-12 – 2019-11-13 (×2): 200 ug/h via INTRAVENOUS
  Filled 2019-11-10 (×6): qty 250

## 2019-11-10 MED ORDER — LACTATED RINGERS IV SOLN: INTRAVENOUS | Status: DC

## 2019-11-10 MED ORDER — PROPOFOL 1000 MG/100ML IV EMUL: 0.0000 ug/kg/min | INTRAVENOUS | Status: DC

## 2019-11-10 MED ORDER — ATORVASTATIN CALCIUM 40 MG PO TABS
40.0000 mg | ORAL_TABLET | Freq: Every day | ORAL | Status: DC
  Administered 2019-11-10 – 2019-11-13 (×4): 40 mg
  Filled 2019-11-10 (×4): qty 1

## 2019-11-10 MED ORDER — DEXTROSE 50 % IV SOLN
INTRAVENOUS | Status: AC
  Administered 2019-11-10: 12.5 g
  Filled 2019-11-10: qty 50

## 2019-11-10 MED ORDER — NOREPINEPHRINE 4 MG/250ML-% IV SOLN
2.0000 ug/min | INTRAVENOUS | Status: DC
  Administered 2019-11-10 (×2): 6 ug/min via INTRAVENOUS
  Administered 2019-11-10: 4 ug/min via INTRAVENOUS
  Filled 2019-11-10 (×2): qty 250

## 2019-11-10 MED ORDER — NOREPINEPHRINE 4 MG/250ML-% IV SOLN
0.0000 ug/min | INTRAVENOUS | Status: DC
  Administered 2019-11-10: 2 ug/min via INTRAVENOUS
  Filled 2019-11-10: qty 250

## 2019-11-10 MED ORDER — NOREPINEPHRINE 4 MG/250ML-% IV SOLN
INTRAVENOUS | Status: AC
  Filled 2019-11-10: qty 250

## 2019-11-10 MED ORDER — CHLORHEXIDINE GLUCONATE CLOTH 2 % EX PADS
6.0000 | MEDICATED_PAD | Freq: Every day | CUTANEOUS | Status: DC
  Administered 2019-11-10: 6 via TOPICAL

## 2019-11-10 MED ORDER — SODIUM BICARBONATE 8.4 % IV SOLN
INTRAVENOUS | Status: DC
  Filled 2019-11-10: qty 850

## 2019-11-10 MED FILL — Medication: Qty: 1 | Status: AC

## 2019-11-10 NOTE — Progress Notes (Signed)
This RN and Diamantina Providence, RN wasted 163ml of Fentanyl in the [employer]. Pt was previously on this drip and was removed and wasted d/t changing of IV Tubing. IV Tubing was previous running in a PIV and was switched to CVC.

## 2019-11-10 NOTE — H&P (Addendum)
NAME:  Ralph Becker, MRN:  [id number], DOB:  [birth year], LOS: 0 ADMISSION DATE:  11/09/2019, CONSULTATION DATE:  11/10/19 REFERRING MD:  Ralph Becker, CHIEF COMPLAINT:  Cardiac arrest  Brief History    Ralph Becker is a 59 year old man hx of ESRD on dialysis t/th/s, Hep C, COPD, HTN, HLD, seizure hx (?),  s/p cardiac arrest.   History of present illness    Found down and unresponsive at home, last seen awake 5 min prior.  Found to be in asystolic cardiac arrest.  Received 4 min CPR, developed Vfib/vtach, defibrillated twice.  Total 10-12 minute resuscitation.  Intubated by EMS.   In the ED given keppra, calcium, insulin, D50, started on fentanyl and propofol.    Past Medical History  Lipitor, klonopin, flovent, gabapentin, hydralazine, metoprolol, bicarb BID, suboxone, flomax   Significant Hospital Events     Consults:  Nephrology   Procedures:    Significant Diagnostic Tests:  CXR IMPRESSION: 1. Endotracheal tube tip is difficult to visualize but likely at the level of the clavicular heads. 2. Enteric tube in place with tip and side-port below the diaphragm in the stomach. 3. Increasing peribronchial and interstitial thickening, may be pulmonary edema or atypical infection.   Micro Data:  Blood culture pending UA pending  Antimicrobials:    Interim history/subjective:    Objective   Blood pressure 111/86, pulse (!) 59, temperature (!) 92.1 F (33.4 C), resp. rate 16, height 5\' 9"  (1.753 m), weight 60.8 kg, SpO2 100 %.    Vent Mode: PRVC FiO2 (%):  [50 %-100 %] 50 % Set Rate:  [16 bmp] 16 bmp Vt Set:  [570 mL] 570 mL PEEP:  [5 cmH20] 5 cmH20 Plateau Pressure:  [19 cmH20] 19 cmH20  No intake or output data in the 24 hours ending 11/10/19 0315 Filed Weights   11/09/19 2300  Weight: 60.8 kg    Examination: General: intubated, shivers, moves shoulders to touch.   HENT: mm dry, intubated, perrl  Lungs: CTAB Cardiovascular: RRR Abdomen: non distended,  nbs Extremities: no edema Neuro: not following commands no purposeful movements  Resolved Hospital Problem list     Assessment & Plan:  S/p Cardiac arrest: PEA, then vtach/vfib.  Likely 2/2 hyperkalemia, lactic acidosis, volume overload.  Reportedly missed dialysis today?  Fever avoidance.   Vent management.    Hypothermia: likley post arrest related.  Goal fever avoidance.   Lactic acidosis, metabolic acidosis - post arrest.  No evidence of infection.  Started bicarb infusion   Hyperkalemia - plan for HD emergently.  Discussed with Ralph Becker  Elevated INR 2.9, elevated LFTs AST>ALT - no evidence of bleeding.   Check ETOH, check [place] abd.    Trop elevation, NSTEMI: post arrest, follow trop.   Elevated BNP >4000 - echo P (none in our system).HD pending.   Hypoglycemia -monitor.  D50 as needed.   Utox positive for Benzos and THC  HLD: statin  HTN: Held home meds   Myoclonus vs shivering: only occurred a few times on stimulation.  Given Keppra in ED.  Consider neuro consult if movements start again.   Cont propofol.    Best practice:  Diet: NPO   Pain/Anxiety/Delirium protocol (if indicated): on fentanyl and propofol  VAP protocol (if indicated): yes DVT prophylaxis: heparin GI prophylaxis: protonix Glucose control: ISS  Mobility: Bed  Code Status: Full  Family Communication: Will call Disposition:   Labs   CBC: Recent Labs  Lab 11/09/19 2333 11/10/19 0000 11/10/19 0012  11/10/19 0301  WBC  --  8.1  --   --   HGB 14.6 13.7 15.6 13.6  HCT 43.0 47.0 46.0 40.0  MCV  --  100.2*  --   --   PLT  --  166  --   --     Basic Metabolic Panel: Recent Labs  Lab 11/09/19 2333 11/10/19 0000 11/10/19 0012 11/10/19 0301  NA 134* 142 136 137  K 6.5* 7.4* 7.0* 5.7*  CL  --  99 103  --   CO2  --  15*  --   --   GLUCOSE  --  88 80  --   BUN  --  74* 71*  --   CREATININE  --  12.13* 11.60*  --   CALCIUM  --  8.6*  --   --   MG  --  2.5*  --   --     GFR: Estimated Creatinine Clearance: 5.8 mL/min (A) (by C-G formula based on SCr of 11.6 mg/dL (H)). Recent Labs  Lab 11/10/19 0000 11/10/19 0015  WBC 8.1  --   LATICACIDVEN  --  10.5*    Liver Function Tests: Recent Labs  Lab 11/10/19 0000  AST 1,742*  ALT 874*  ALKPHOS 221*  BILITOT 1.6*  PROT 6.5  ALBUMIN 2.2*   No results for input(s): LIPASE, AMYLASE in the last 168 hours. No results for input(s): AMMONIA in the last 168 hours.  ABG    Component Value Date/Time   PHART 7.244 (L) 11/10/2019 0301   PCO2ART 38.7 11/10/2019 0301   PO2ART 212 (H) 11/10/2019 0301   HCO3 17.5 (L) 11/10/2019 0301   TCO2 19 (L) 11/10/2019 0301   ACIDBASEDEF 10.0 (H) 11/10/2019 0301   O2SAT 100.0 11/10/2019 0301     Coagulation Profile: Recent Labs  Lab 11/10/19 0000  INR 2.9*    Cardiac Enzymes: No results for input(s): CKTOTAL, CKMB, CKMBINDEX, TROPONINI in the last 168 hours.  HbA1C: No results found for: HGBA1C  CBG: No results for input(s): GLUCAP in the last 168 hours.  Review of Systems:   Unable to assess.    Past Medical History  He,  has a past medical history of Chronic hepatitis C (Ralph Becker), COPD (chronic obstructive pulmonary disease) (Ralph Becker) (09/08/2019), ESRD (end stage renal disease) (Ralph Becker), Essential hypertension, Mixed hyperlipidemia, Nicotine dependence, cigarettes, uncomplicated, and Seizure disorder (Ralph Becker) (09/08/2019).   Surgical History    Past Surgical History:  Procedure Laterality Date  . AV FISTULA PLACEMENT Left 09/15/2019   Procedure: ARTERIOVENOUS (AV) FISTULA CREATION LEFT ARM;  Surgeon: Ralph Posner, MD;  Location: [facility] OR;  Service: Vascular;  Laterality: Left;     Social History   reports that he has been smoking. He has never used smokeless tobacco. He reports that he does not drink alcohol and does not use drugs.   Family History   His Family history is unknown by patient.   Allergies Allergies  Allergen Reactions  . Penicillins      Unknown. Can't remember.      Home Medications  Prior to Admission medications   Medication Sig Start Date End Date Taking? Authorizing Provider  atorvastatin (LIPITOR) 40 MG tablet Take 40 mg by mouth daily. 08/08/19   [provider]  clonazePAM (KLONOPIN) 1 MG tablet Take 1 mg by mouth at bedtime. 07/11/19   [provider]  FLOVENT HFA 220 MCG/ACT inhaler Inhale 2 puffs into the lungs every 12 (twelve) hours. 07/11/19   [provider]  gabapentin (NEURONTIN) 300 MG capsule Take 300 mg by mouth daily. 06/06/19   [provider]  hydrALAZINE (APRESOLINE) 50 MG tablet Take 50 mg by mouth in the morning and at bedtime.  08/23/19   [provider]  metoprolol tartrate (LOPRESSOR) 25 MG tablet Take 25 mg by mouth 2 (two) times daily. 08/08/19   [provider]  sodium bicarbonate 650 MG tablet Take 650 mg by mouth 2 (two) times daily.  06/06/19   [provider]  SUBOXONE 8-2 MG FILM Place 1 Film under the tongue daily. 08/23/19   [provider]  tamsulosin (FLOMAX) 0.4 MG CAPS capsule Take 0.4 mg by mouth at bedtime. 06/26/19   [provider]     Critical care time: 45 minutes.

## 2019-11-10 NOTE — Procedures (Signed)
Central Venous Catheter Insertion Procedure Note  Ralph Becker  [id number]  Jun 24, 1960  Date:11/10/19  Time:5:35 PM   Provider Performing:Persephone Schriever   Procedure: Insertion of Non-tunneled Central Venous [phone]) with US guidance (10315)   Indication(s) Medication administration  Consent Risks of the procedure as well as the alternatives and risks of each were explained to the patient and/or caregiver.  Consent for the procedure was obtained and is signed in the bedside chart  Anesthesia Topical only with 1% lidocaine   Timeout Verified patient identification, verified procedure, site/side was marked, verified correct patient position, special equipment/implants available, medications/allergies/relevant history reviewed, required imaging and test results available.  Sterile Technique Maximal sterile technique including full sterile barrier drape, hand hygiene, sterile gown, sterile gloves, mask, hair covering, sterile ultrasound probe cover (if used).  Procedure Description Area of catheter insertion was cleaned with chlorhexidine and draped in sterile fashion.  With real-time ultrasound guidance a central venous catheter was placed into the left subclavian vein. Nonpulsatile blood flow and easy flushing noted in all ports.  The catheter was sutured in place and sterile dressing applied.  Complications/Tolerance None; patient tolerated the procedure well. Chest X-ray is ordered to verify placement for internal jugular or subclavian cannulation.   Chest x-ray is not ordered for femoral cannulation.  EBL Minimal  Specimen(s) None

## 2019-11-10 NOTE — Consult Note (Signed)
Renal Service Consult Note [facility]  Ralph Becker 11/10/2019 [place] Requesting Physician:  Dr Duwayne Heck  Reason for Consult:  ESRD pt s/p cardiac arrest HPI: The patient is a 59 y.o. year-old w/  Hx of seizure d/o, HTN, COPD, chronic hep C and opioid abuse on suboxone. Pt takes HD TTS schedule in [place]. Pt was found down reportedly for about 5 min, unknown bystander CPR status. The facility was a known drug house per EMS. On EMS arrival pt was in asystole, rec'd multiple rounds of compressions around 10-12 in total, converted to Vfib f/b then Vtach sp shock x 2. Rec'd epi x 1, bicarb x 1 and 2 gm IV Ca++. Pt brought to ED where CXR showed COPD and IS thickening c/w pulm edema vs infection. Initial K+ was 7.4 at midnight, he rec'd IV Ca/ ins / glucose. Repeat K+ is 5.7 iStat at 3 am.  BP's are 409- 811 systolic in ED, awaiting ICU bed now. ABG 7.19/ 38 from 11 pm, repeat 7.24/ 38 from 3 am. Asked to see for dialysis.   Pt unable to provide any history.  ROS n/a   Past Medical History  Past Medical History:  Diagnosis Date   Chronic hepatitis C (HCC)    COPD (chronic obstructive pulmonary disease) ([place]) 09/08/2019   ESRD (end stage renal disease) (HCC)    HD Tues, Thurs, Sat   Essential hypertension    Mixed hyperlipidemia    Nicotine dependence, cigarettes, uncomplicated    Seizure disorder ([place]) 09/08/2019   Past Surgical History  Past Surgical History:  Procedure Laterality Date   AV FISTULA PLACEMENT Left 09/15/2019   Procedure: ARTERIOVENOUS (AV) FISTULA CREATION LEFT ARM;  Surgeon: [name], MD;  Location: [facility] OR;  Service: Vascular;  Laterality: Left;   Family History  Family History  Family history unknown: Yes   Social History  reports that he has been smoking. He has never used smokeless tobacco. He reports that he does not drink alcohol and does not use drugs. Allergies  Allergies  Allergen Reactions   Penicillins     Unknown.  Can't remember.    Home medications Prior to Admission medications   Medication Sig Start Date End Date Taking? Authorizing Provider  atorvastatin (LIPITOR) 40 MG tablet Take 40 mg by mouth daily. 08/08/19   [provider]  clonazePAM (KLONOPIN) 1 MG tablet Take 1 mg by mouth at bedtime. 07/11/19   [provider]  FLOVENT HFA 220 MCG/ACT inhaler Inhale 2 puffs into the lungs every 12 (twelve) hours. 07/11/19   [provider]  gabapentin (NEURONTIN) 300 MG capsule Take 300 mg by mouth daily. 06/06/19   [provider]  hydrALAZINE (APRESOLINE) 50 MG tablet Take 50 mg by mouth in the morning and at bedtime.  08/23/19   [provider]  metoprolol tartrate (LOPRESSOR) 25 MG tablet Take 25 mg by mouth 2 (two) times daily. 08/08/19   [provider]  sodium bicarbonate 650 MG tablet Take 650 mg by mouth 2 (two) times daily.  06/06/19   [provider]  SUBOXONE 8-2 MG FILM Place 1 Film under the tongue daily. 08/23/19   [provider]  tamsulosin (FLOMAX) 0.4 MG CAPS capsule Take 0.4 mg by mouth at bedtime. 06/26/19   [provider]     Vitals:   11/10/19 0210 11/10/19 0215 11/10/19 0300 11/10/19 0304  BP: 118/77 112/89 111/86   Pulse: (!) 59 (!) 26 66 (!) 59  Resp: 15 16 16 16   Temp: (!) 91.7 F (33.2 C) (!) 91.8 F (33.2 C) (!) 92.1 F (33.4 C) (!) 92.1 F (33.4 C)  SpO2: 100% 100% 100% 100%  Weight:      Height:       Exam Gen on vent, not responding, on sedating drip Some posturing of L upper torso No rash, cyanosis or gangrene Sclera anicteric, throat w ETT and OG tube No jvd or bruits Chest bilat insp rales at the bases RRR no MRG Abd soft ntnd no mass or ascites +bs GU normal male w/ foley, no urine in bag MS no joint effusions or deformity Ext no leg or UE edema, no wounds or ulcers Neuro is not responsive R IJ TDC exit c/d   AST 1742/ ALT 874 Tbili 1.6  Cr 12  K 7.4 > repeat 5.7 at 3 am  BUN 74    Na 142  CO2 15   ABG  7.19/ 38, repeat 3 am 7.24/ 38   CXR - IMPRESSION: 1. Endotracheal tube tip is difficult to visualize but likely at the level of the clavicular heads. 2. Enteric tube in place with tip and side-port below the diaphragm in the stomach. 3. Increasing peribronchial and interstitial thickening, may be pulmonary edema or atypical infection.   Home meds:  - lipitor 40/ hydralazine 50 bid/ metoprolol 25 bid/ sod bicarb bid  - suboxone 8-2 mg film 1 qd/ klonopin 1 mg hs/ neurontin 300 qd  - flomax/  prn's/ vitamins/ supplements    OP HD: TTS [facility]  4h  2/2.25 bath  TDC  ?kg   Hep 4000   Assessment/ Plan: 1. Cardiac arrest - significant time down during resusc efforts.  2. Resp arrest - on vent, CXR abnormal w/ prob superimposed pulm edema on background COPD. No vol overload on exam though.  3. ESRD - usual HD TTS.  K+ high, creat 11-12, not sure of last HD, get records in am.  Severe hyperkalemia, sp IV Ca/ins / glu.  HD in ICU tonight w/ pressor support as needed.  4. Hyperkalemia - as above 5. Vol - no edema on exam or gross vol overload. CXR suggesting edema.  6. COPD 7. H/o opioid abuse - on suboxone 8. H/o seizure d/o - given IV keppra in the ED      Ralph Splinter  MD 11/10/2019, 3:19 AM  Recent Labs  Lab 11/10/19 0000 11/10/19 0000 11/10/19 0012 11/10/19 0301  WBC 8.1  --   --   --   HGB 13.7   < > 15.6 13.6   < > = values in this interval not displayed.   Recent Labs  Lab 11/10/19 0000 11/10/19 0000 11/10/19 0012 11/10/19 0301  K 7.4*   < > 7.0* 5.7*  BUN 74*  --  71*  --   CREATININE 12.13*  --  11.60*  --   CALCIUM 8.6*  --   --   --    < > = values in this interval not displayed.

## 2019-11-10 NOTE — Progress Notes (Signed)
Per Ina Homes, MD, begin tube feedings Vital 1.5 at 29ml/hr. If blood glucose levels remain to stay low, place orders for D10 14ml/hr. Tynika Luddy RN, took these verbal orders.

## 2019-11-10 NOTE — Progress Notes (Signed)
*  PRELIMINARY RESULTS* Echocardiogram 2D Echocardiogram has been performed.  Samuel Germany 11/10/2019, 1:47 PM

## 2019-11-10 NOTE — Procedures (Signed)
Intubation Procedure Note  Ralph Becker  [id number]  1961-01-06  Date:11/10/19  Time:5:36 PM   Provider Performing:Sarha Bartelt    Procedure: Intubation (31500)  Indication(s) Respiratory Failure  Consent Risks of the procedure as well as the alternatives and risks of each were explained to the patient and/or caregiver.  Consent for the procedure was obtained and is signed in the bedside chart   Anesthesia Etomidate and Rocuronium   Time Out Verified patient identification, verified procedure, site/side was marked, verified correct patient position, special equipment/implants available, medications/allergies/relevant history reviewed, required imaging and test results available.   Sterile Technique Usual hand hygeine, masks, and gloves were used   Procedure Description Patient positioned in bed supine.  Sedation given as noted above.  Patient was intubated with endotracheal tube using MAC 4.  View was Grade 1 full glottis .  Number of attempts was 1.  Colorimetric CO2 detector was consistent with tracheal placement.   Complications/Tolerance None; patient tolerated the procedure well. Chest X-ray is ordered to verify placement.   EBL Minimal   Specimen(s) None

## 2019-11-10 NOTE — Procedures (Signed)
Extubation Procedure Note  Patient Details:   Name: Romuald Mccaslin DOB: [birth year] MRN: [id number]   Airway Documentation:    Vent end date: 11/10/19 Vent end time: 1437   Evaluation  O2 sats: stable throughout Complications: No apparent complications Patient did tolerate procedure well. Bilateral Breath Sounds: Diminished, Coarse crackles   Pt extubated to 13L HFNC per MD order. Pt had positive cuff leak and no stridor noted.  Vilinda Blanks 11/10/2019, 2:38 PM

## 2019-11-10 NOTE — ED Provider Notes (Signed)
Patient found to have potassium greater than 7.  He HAS already received medicines for hyperkalemia.  I discussed the case with Dr. Jonnie Finner with nephrology as patient will require dialysis.  I advised Dr. Jonnie Finner the patient will be admitted to the ICU.   Ripley Fraise, MD 11/10/19 [phone]

## 2019-11-10 NOTE — ED Notes (Signed)
Report given to Malcolm

## 2019-11-10 NOTE — Progress Notes (Signed)
This note also relates to the following rows which could not be included: Temp - Cannot attach notes to unvalidated device data    11/10/19 0936  Hand-Off documentation  Handoff Given Given to shift RN/LPN  Report given to (Full Name) Andruw,RN  Handoff Received Other (Comment) (HD)  Report received from (Full Name) Jerrye Bushy  Vital Signs  Pulse Rate (!) 54  Resp 16  BP (!) 88/50  Oxygen Therapy  SpO2 93 %  During Hemodialysis Assessment  Intra-Hemodialysis Comments Tx completed  Post-Hemodialysis Assessment  Rinseback Volume (mL) 250 mL  KECN 165 V  Dialyzer Clearance Heavily streaked  Duration of HD Treatment -hour(s) 3.5 hour(s)  Hemodialysis Intake (mL) 500 mL  UF Total -Machine (mL) 1664 mL  Net UF (mL) 1164 mL  Post-Hemodialysis Comments tx complete-pt stable  Hemodialysis Catheter  No placement date or time found.   Placed prior to admission: Yes  Site Condition No complications  Blue Lumen Status Flushed;Heparin locked;Capped (Central line)  Red Lumen Status Flushed;Heparin locked;Capped (Central line)  Catheter fill solution Heparin 1000 units/ml  Catheter fill volume (Arterial) 1.8 cc  Catheter fill volume (Venous) 1.8  Dressing Type Occlusive  Dressing Status Clean;Dry;Intact  Drainage Description None  Dressing Change Due 11/17/19  Post treatment catheter status Capped and Clamped  HD tx complete-Unable to meet UF goal due to persistent hypotension. Despite levophed titration, UF=1.1 liters net.

## 2019-11-10 NOTE — Progress Notes (Signed)
eLink Physician-Brief Progress Note Patient Name: Montee Tallman DOB: 1960/08/29 MRN: [id number]   Date of Service  11/10/2019  HPI/Events of Note  Patient admitted with out of hospital cardiac arrest s/p successful resuscitation, patient is currently comatose.  eICU Interventions  New Patient Evaluation completed.        Kerry Kass Masey Scheiber 11/10/2019, 5:31 AM

## 2019-11-10 NOTE — Progress Notes (Signed)
[facility] Progress Note Patient Name: Ralph Becker DOB: Mar 17, 1960 MRN: [id number]   Date of Service  11/10/2019  HPI/Events of Note  Pt is trying to pull at vent.  needs wrist restraints back please.  also bedsdie RN is requesting some extra sedation possibly propofol?  has given numerous bolus and still agitated  Camera: Discussed with bed side RN. On fenta 150, titration up, got few extra boluses also. HR 130. PIP 20. PEEP at 5.   eICU Interventions  - restraints ordered - Propofol gtt for now. On Levo at 10. Watch for hypotension, if so call back to consider versed.      Intervention Category Major Interventions: Delirium, psychosis, severe agitation - evaluation and management  Elmer Sow 11/10/2019, 10:22 PM

## 2019-11-11 ENCOUNTER — Inpatient Hospital Stay (HOSPITAL_COMMUNITY): Payer: Medicaid Other

## 2019-11-11 ENCOUNTER — Encounter (HOSPITAL_COMMUNITY): Payer: Self-pay | Admitting: Pulmonary Disease

## 2019-11-11 ENCOUNTER — Other Ambulatory Visit: Payer: Self-pay

## 2019-11-11 DIAGNOSIS — Z66 Do not resuscitate: Secondary | ICD-10-CM

## 2019-11-11 DIAGNOSIS — I469 Cardiac arrest, cause unspecified: Secondary | ICD-10-CM

## 2019-11-11 DIAGNOSIS — Z7189 Other specified counseling: Secondary | ICD-10-CM

## 2019-11-11 DIAGNOSIS — Z515 Encounter for palliative care: Secondary | ICD-10-CM

## 2019-11-11 DIAGNOSIS — R627 Adult failure to thrive: Secondary | ICD-10-CM

## 2019-11-11 LAB — GLUCOSE, CAPILLARY
Glucose-Capillary: 100 mg/dL — ABNORMAL HIGH (ref 70–99)
Glucose-Capillary: 159 mg/dL — ABNORMAL HIGH (ref 70–99)
Glucose-Capillary: 85 mg/dL (ref 70–99)
Glucose-Capillary: 90 mg/dL (ref 70–99)
Glucose-Capillary: 93 mg/dL (ref 70–99)
Glucose-Capillary: 98 mg/dL (ref 70–99)

## 2019-11-11 LAB — MAGNESIUM
Magnesium: 1.7 mg/dL (ref 1.7–2.4)
Magnesium: 1.8 mg/dL (ref 1.7–2.4)

## 2019-11-11 LAB — CBC
HCT: 38.1 % — ABNORMAL LOW (ref 39.0–52.0)
HCT: 35.8 % — ABNORMAL LOW (ref 39.0–52.0)
Hemoglobin: 11.9 g/dL — ABNORMAL LOW (ref 13.0–17.0)
Hemoglobin: 10.9 g/dL — ABNORMAL LOW (ref 13.0–17.0)
MCH: 28.7 pg (ref 26.0–34.0)
MCH: 28.3 pg (ref 26.0–34.0)
MCHC: 31.2 g/dL (ref 30.0–36.0)
MCHC: 30.4 g/dL (ref 30.0–36.0)
MCV: 91.8 fL (ref 80.0–100.0)
MCV: 93 fL (ref 80.0–100.0)
Platelets: 86 10*3/uL — ABNORMAL LOW (ref 150–400)
Platelets: 67 10*3/uL — ABNORMAL LOW (ref 150–400)
RBC: 4.15 MIL/uL — ABNORMAL LOW (ref 4.22–5.81)
RBC: 3.85 MIL/uL — ABNORMAL LOW (ref 4.22–5.81)
RDW: 16.4 % — ABNORMAL HIGH (ref 11.5–15.5)
RDW: 16.5 % — ABNORMAL HIGH (ref 11.5–15.5)
WBC: 16.3 10*3/uL — ABNORMAL HIGH (ref 4.0–10.5)
WBC: 12.2 10*3/uL — ABNORMAL HIGH (ref 4.0–10.5)
nRBC: 0.3 % — ABNORMAL HIGH (ref 0.0–0.2)
nRBC: 0.2 % (ref 0.0–0.2)

## 2019-11-11 LAB — BASIC METABOLIC PANEL
Anion gap: 14 (ref 5–15)
Anion gap: 12 (ref 5–15)
BUN: 42 mg/dL — ABNORMAL HIGH (ref 8–23)
BUN: 43 mg/dL — ABNORMAL HIGH (ref 8–23)
CO2: 25 mmol/L (ref 22–32)
CO2: 25 mmol/L (ref 22–32)
Calcium: 7.2 mg/dL — ABNORMAL LOW (ref 8.9–10.3)
Calcium: 7.1 mg/dL — ABNORMAL LOW (ref 8.9–10.3)
Chloride: 97 mmol/L — ABNORMAL LOW (ref 98–111)
Chloride: 98 mmol/L (ref 98–111)
Creatinine, Ser: 7.57 mg/dL — ABNORMAL HIGH (ref 0.61–1.24)
Creatinine, Ser: 7.57 mg/dL — ABNORMAL HIGH (ref 0.61–1.24)
GFR calc Af Amer: 8 mL/min — ABNORMAL LOW (ref >60)
GFR calc Af Amer: 8 mL/min — ABNORMAL LOW (ref >60)
GFR calc non Af Amer: 7 mL/min — ABNORMAL LOW (ref >60)
GFR calc non Af Amer: 7 mL/min — ABNORMAL LOW (ref >60)
Glucose, Bld: 79 mg/dL (ref 70–99)
Glucose, Bld: 85 mg/dL (ref 70–99)
Potassium: 5.9 mmol/L — ABNORMAL HIGH (ref 3.5–5.1)
Potassium: 5.6 mmol/L — ABNORMAL HIGH (ref 3.5–5.1)
Sodium: 136 mmol/L (ref 135–145)
Sodium: 135 mmol/L (ref 135–145)

## 2019-11-11 LAB — RENAL FUNCTION PANEL
Albumin: 2.1 g/dL — ABNORMAL LOW (ref 3.5–5.0)
Anion gap: 13 (ref 5–15)
BUN: 48 mg/dL — ABNORMAL HIGH (ref 8–23)
CO2: 23 mmol/L (ref 22–32)
Calcium: 7.3 mg/dL — ABNORMAL LOW (ref 8.9–10.3)
Chloride: 102 mmol/L (ref 98–111)
Creatinine, Ser: 7.7 mg/dL — ABNORMAL HIGH (ref 0.61–1.24)
GFR calc Af Amer: 8 mL/min — ABNORMAL LOW (ref >60)
GFR calc non Af Amer: 7 mL/min — ABNORMAL LOW (ref >60)
Glucose, Bld: 99 mg/dL (ref 70–99)
Phosphorus: 7.6 mg/dL — ABNORMAL HIGH (ref 2.5–4.6)
Potassium: 5 mmol/L (ref 3.5–5.1)
Sodium: 138 mmol/L (ref 135–145)

## 2019-11-11 LAB — D-DIMER, QUANTITATIVE: D-Dimer, Quant: >20 ug/mL-FEU — ABNORMAL HIGH (ref 0.00–0.50)

## 2019-11-11 LAB — HEPATIC FUNCTION PANEL
ALT: 1133 U/L — ABNORMAL HIGH (ref 0–44)
AST: 1672 U/L — ABNORMAL HIGH (ref 15–41)
Albumin: 1.7 g/dL — ABNORMAL LOW (ref 3.5–5.0)
Alkaline Phosphatase: 177 U/L — ABNORMAL HIGH (ref 38–126)
Bilirubin, Direct: 1.9 mg/dL — ABNORMAL HIGH (ref 0.0–0.2)
Indirect Bilirubin: 1.2 mg/dL — ABNORMAL HIGH (ref 0.3–0.9)
Total Bilirubin: 3.1 mg/dL — ABNORMAL HIGH (ref 0.3–1.2)
Total Protein: 5.2 g/dL — ABNORMAL LOW (ref 6.5–8.1)

## 2019-11-11 LAB — RAPID URINE DRUG SCREEN, HOSP PERFORMED
Amphetamines: NOT DETECTED
Barbiturates: NOT DETECTED
Benzodiazepines: NOT DETECTED
Cocaine: NOT DETECTED
Opiates: NOT DETECTED
Tetrahydrocannabinol: POSITIVE — AB

## 2019-11-11 LAB — URINALYSIS, ROUTINE W REFLEX MICROSCOPIC
Bilirubin Urine: NEGATIVE
Glucose, UA: 150 mg/dL — AB
Ketones, ur: 5 mg/dL — AB
Leukocytes,Ua: NEGATIVE
Nitrite: NEGATIVE
Protein, ur: >=300 mg/dL — AB
Specific Gravity, Urine: 1.011 (ref 1.005–1.030)
pH: 8 (ref 5.0–8.0)

## 2019-11-11 LAB — TROPONIN I (HIGH SENSITIVITY): Troponin I (High Sensitivity): 622 ng/L (ref <18)

## 2019-11-11 LAB — COOXEMETRY PANEL
Carboxyhemoglobin: 1.7 % — ABNORMAL HIGH (ref 0.5–1.5)
Carboxyhemoglobin: 1.7 % — ABNORMAL HIGH (ref 0.5–1.5)
Methemoglobin: 1.2 % (ref 0.0–1.5)
Methemoglobin: 1.2 % (ref 0.0–1.5)
O2 Saturation: 91.6 %
O2 Saturation: 89.2 %
Total hemoglobin: 11.6 g/dL — ABNORMAL LOW (ref 12.0–16.0)
Total hemoglobin: 11.9 g/dL — ABNORMAL LOW (ref 12.0–16.0)

## 2019-11-11 LAB — LACTIC ACID, PLASMA: Lactic Acid, Venous: 2.1 mmol/L (ref 0.5–1.9)

## 2019-11-11 LAB — PHOSPHORUS: Phosphorus: 7.3 mg/dL — ABNORMAL HIGH (ref 2.5–4.6)

## 2019-11-11 LAB — TRIGLYCERIDES: Triglycerides: 228 mg/dL — ABNORMAL HIGH (ref <150)

## 2019-11-11 LAB — PROCALCITONIN: Procalcitonin: 8.54 ng/mL

## 2019-11-11 MED ORDER — VASOPRESSIN 20 UNITS/100 ML INFUSION FOR SHOCK
0.0400 [IU]/min | INTRAVENOUS | Status: DC
  Administered 2019-11-11 – 2019-11-12 (×3): 0.04 [IU]/min via INTRAVENOUS
  Administered 2019-11-13: 0.01 [IU]/min via INTRAVENOUS
  Filled 2019-11-11 (×4): qty 100

## 2019-11-11 MED ORDER — SODIUM CHLORIDE 0.9 % IV SOLN
2.0000 g | INTRAVENOUS | Status: DC
  Administered 2019-11-11 – 2019-11-16 (×6): 2 g via INTRAVENOUS
  Filled 2019-11-11: qty 20
  Filled 2019-11-11 (×2): qty 2
  Filled 2019-11-11: qty 20
  Filled 2019-11-11: qty 2
  Filled 2019-11-11: qty 20

## 2019-11-11 MED ORDER — METOPROLOL TARTRATE 25 MG PO TABS
25.0000 mg | ORAL_TABLET | Freq: Two times a day (BID) | ORAL | Status: DC
  Administered 2019-11-11 – 2019-11-16 (×8): 25 mg via ORAL
  Filled 2019-11-11 (×8): qty 1

## 2019-11-11 MED ORDER — SODIUM CHLORIDE 0.9 % IV SOLN: INTRAVENOUS | Status: AC

## 2019-11-11 MED ORDER — SODIUM CHLORIDE 0.9 % IV BOLUS
250.0000 mL | Freq: Once | INTRAVENOUS | Status: AC
  Administered 2019-11-11: 250 mL via INTRAVENOUS

## 2019-11-11 MED ORDER — LACTATED RINGERS IV BOLUS
1000.0000 mL | Freq: Once | INTRAVENOUS | Status: AC
  Administered 2019-11-11: 1000 mL via INTRAVENOUS

## 2019-11-11 MED ORDER — ALBUMIN HUMAN 25 % IV SOLN
25.0000 g | Freq: Four times a day (QID) | INTRAVENOUS | Status: AC
  Administered 2019-11-11 – 2019-11-12 (×4): 25 g via INTRAVENOUS
  Filled 2019-11-11 (×4): qty 100

## 2019-11-11 MED ORDER — HEPARIN SODIUM (PORCINE) 1000 UNIT/ML DIALYSIS
2000.0000 [IU] | INTRAMUSCULAR | Status: DC | PRN
  Administered 2019-11-11: 3600 [IU] via INTRAVENOUS_CENTRAL
  Filled 2019-11-11 (×2): qty 2

## 2019-11-11 MED ORDER — MIDODRINE HCL 5 MG PO TABS
10.0000 mg | ORAL_TABLET | Freq: Three times a day (TID) | ORAL | Status: DC
  Administered 2019-11-11 – 2019-11-13 (×5): 10 mg
  Filled 2019-11-11 (×5): qty 2

## 2019-11-11 MED ORDER — VITAL HIGH PROTEIN PO LIQD
1000.0000 mL | ORAL | Status: AC
  Administered 2019-11-11 (×2): 1000 mL

## 2019-11-11 MED ORDER — HYDROCORTISONE NA SUCCINATE PF 100 MG IJ SOLR
50.0000 mg | Freq: Four times a day (QID) | INTRAMUSCULAR | Status: DC
  Administered 2019-11-11 – 2019-11-12 (×4): 50 mg via INTRAVENOUS
  Filled 2019-11-11 (×4): qty 2

## 2019-11-11 MED ORDER — VITAL AF 1.2 CAL PO LIQD
1000.0000 mL | ORAL | Status: DC
  Administered 2019-11-12: 1000 mL

## 2019-11-11 MED ORDER — IOHEXOL 350 MG/ML SOLN
75.0000 mL | Freq: Once | INTRAVENOUS | Status: AC | PRN
  Administered 2019-11-11: 75 mL via INTRAVENOUS

## 2019-11-11 MED ORDER — DOCUSATE SODIUM 50 MG/5ML PO LIQD
100.0000 mg | Freq: Two times a day (BID) | ORAL | Status: DC
  Administered 2019-11-11 – 2019-11-13 (×4): 100 mg
  Filled 2019-11-11 (×4): qty 10

## 2019-11-11 MED ORDER — RENA-VITE PO TABS
1.0000 | ORAL_TABLET | Freq: Every day | ORAL | Status: DC
  Administered 2019-11-11 – 2019-11-15 (×4): 1 via ORAL
  Filled 2019-11-11 (×4): qty 1

## 2019-11-11 MED ORDER — MIDODRINE HCL 5 MG PO TABS
10.0000 mg | ORAL_TABLET | Freq: Three times a day (TID) | ORAL | Status: DC
  Administered 2019-11-11: 10 mg via ORAL
  Filled 2019-11-11: qty 2

## 2019-11-11 MED ORDER — AMIODARONE LOAD VIA INFUSION
150.0000 mg | Freq: Once | INTRAVENOUS | Status: AC
  Administered 2019-11-11: 150 mg via INTRAVENOUS
  Filled 2019-11-11: qty 83.34

## 2019-11-11 MED ORDER — AMIODARONE HCL IN DEXTROSE 360-4.14 MG/200ML-% IV SOLN
30.0000 mg/h | INTRAVENOUS | Status: DC
  Administered 2019-11-11: 30 mg/h via INTRAVENOUS

## 2019-11-11 MED ORDER — PHENYLEPHRINE HCL-NACL 10-0.9 MG/250ML-% IV SOLN
0.0000 ug/min | INTRAVENOUS | Status: DC
  Administered 2019-11-11: 20 ug/min via INTRAVENOUS
  Administered 2019-11-11: 35 ug/min via INTRAVENOUS
  Administered 2019-11-11 (×2): 60 ug/min via INTRAVENOUS
  Filled 2019-11-11 (×4): qty 250

## 2019-11-11 MED ORDER — DEXMEDETOMIDINE HCL IN NACL 400 MCG/100ML IV SOLN
0.4000 ug/kg/h | INTRAVENOUS | Status: DC
  Administered 2019-11-11: 0.6 ug/kg/h via INTRAVENOUS
  Administered 2019-11-11: 0.9 ug/kg/h via INTRAVENOUS
  Filled 2019-11-11 (×2): qty 100

## 2019-11-11 MED ORDER — AMIODARONE HCL IN DEXTROSE 360-4.14 MG/200ML-% IV SOLN
60.0000 mg/h | INTRAVENOUS | Status: DC
  Administered 2019-11-11 (×2): 60 mg/h via INTRAVENOUS
  Filled 2019-11-11: qty 200

## 2019-11-11 MED ORDER — METOPROLOL TARTRATE 5 MG/5ML IV SOLN
2.5000 mg | Freq: Once | INTRAVENOUS | Status: AC
  Administered 2019-11-11: 2.5 mg via INTRAVENOUS
  Filled 2019-11-11: qty 5

## 2019-11-11 MED ORDER — PROSOURCE TF PO LIQD
45.0000 mL | Freq: Two times a day (BID) | ORAL | Status: DC
  Administered 2019-11-11: 45 mL
  Filled 2019-11-11: qty 45

## 2019-11-11 MED ORDER — HEPARIN SODIUM (PORCINE) 5000 UNIT/ML IJ SOLN
5000.0000 [IU] | Freq: Three times a day (TID) | INTRAMUSCULAR | Status: DC
  Administered 2019-11-11 – 2019-11-12 (×3): 5000 [IU] via SUBCUTANEOUS
  Filled 2019-11-11 (×3): qty 1

## 2019-11-11 NOTE — Progress Notes (Signed)
[facility] Progress Note Patient Name: Ralph Becker DOB: 03-03-1961 MRN: [id number]   Date of Service  11/11/2019  HPI/Events of Note  HR cont to be in tachy/flutter  130's--140's>.on levo at 41mcg is it okay to switch pt to neo instead of levo and start on amio with bolus? what you think>.pt has low grade temp and when camera in appepars comfortabl ans synchronous with the vent>>noted also K-was 5.9 at 39mn.  Discussed with Lucina Mellow. camera eval done. Looks like sinus tach. EKG earlier a flutter?. CVP on 30 degree up showing only 6. Fever better.  eICU Interventions  - will try 250 ml bolus to see tachy improves., first.      Intervention Category Intermediate Interventions: Arrhythmia - evaluation and management;Hypotension - evaluation and management  Elmer Sow 11/11/2019, 3:24 AM

## 2019-11-11 NOTE — Progress Notes (Addendum)
[facility] Progress Note Patient Name: Ralph Becker DOB: 1961/03/04 MRN: [id number]   Date of Service  11/11/2019  HPI/Events of Note  Persisting sinus tachy, / a flutter.  Camera; HR 130 to 140. Stable MAP on 10 Levo all night. AM labs reviewed. Not responding to lopressor/fluid trials.  RVD, normal EF on ECHO. Demand ischemia. K ok. S/p HD on 6 th.   eICU Interventions  - change to Neosynephrine from levo and see.  Discussed with bed side MD team.  - get d dimer level, but platelet going down to 80's. Hg > 11. No bleeding.      Intervention Category Intermediate Interventions: Diagnostic test evaluation;Arrhythmia - evaluation and management  Elmer Sow 11/11/2019, 5:03 AM   5;30 AM  After discussion with bed side MD team, Decided for CTPA stat to r/o PE.  Bed side RN notified.

## 2019-11-11 NOTE — Progress Notes (Addendum)
[facility] Progress Note  Subjective: failed extubation trial yest , persistent sinus tach. PE study neg.   Vitals:   11/11/19 0630 11/11/19 0736 11/11/19 0800 11/11/19 1102  BP: 123/83  117/81 (!) 85/66  Pulse: (!) 136 (!) 138 (!) 138 (!) 132  Resp: _0 Temp:  97.9 F (36.6 C) 97.7 F (36.5 C) (!) 97 F (36.1 C)  TempSrc:   Bladder   SpO2: 97% 98% 95% 97%  Weight:      Height:        Exam: Gen on vent, not responding, on sedating drip Some posturing of L upper torso No rash, cyanosis or gangrene Sclera anicteric, throat w ETT and OG tube No jvd or bruits Chest bilat insp rales at the bases RRR no MRG Abd soft ntnd no mass or ascites +bs GU normal male w/ foley, no urine in bag MS no joint effusions or deformity Ext no leg or UE edema, no wounds or ulcers Neuro is not responsive R IJ TDC exit c/d   AST 1742/ ALT 874 Tbili 1.6  Cr 12  K 7.4 > repeat 5.7 at 3 am  BUN 74   Na 142  CO2 15   ABG  7.19/ 38, repeat 3 am 7.24/ 38   CXR - IMPRESSION: 1. Endotracheal tube tip is difficult to visualize but likely at the level of the clavicular heads. 2. Enteric tube in place with tip and side-port below the diaphragm in the stomach. 3. Increasing peribronchial and interstitial thickening, may be pulmonary edema or atypical infection.   Home meds:  - lipitor 40/ hydralazine 50 bid/ metoprolol 25 bid/ sod bicarb bid  - suboxone 8-2 mg film 1 qd/ klonopin 1 mg hs/ neurontin 300 qd  - flomax/  prn's/ vitamins/ supplements      OP HD: TTS [facility]  4h  2/2.25 bath  TDC  60.5kg   Hep 4000  - calc 0.25 ug po tiw   Assessment/ Plan: 1. Cardiac arrest - PEA, then vtach/ vib. Followed commands yest per ccm 2. Resp arrest/ prob PNA - on vent, asp pneumonitis vs PNA. On IV abx.  3. Shock/ RV failure/ tachy - poss septic, per ccm 4. ESRD - usual HD TTS. Plan HD today w/ pressor support, get back on schedule. Keep fluid +.  5. Hyperkalemia - severe, K 7 yest  > 5.6 today.  6. Vol - looks dry on exam, CVP 0-4, getting IVF's and alb 7. COPD 8. H/o opioid abuse - on suboxone 9. H/o seizure d/o - given IV keppra in the ED     [place] 11/11/2019, 11:20 AM   Recent Labs  Lab 11/10/19 0559 11/10/19 0559 11/11/19 0043 11/11/19 0339 11/11/19 0417  K 6.1*   < > 5.9*  --  5.6*  BUN 77*   < > 42*  --  43*  CREATININE 11.96*   < > 7.57*  --  7.57*  CALCIUM 8.1*   < > 7.2*  --  7.1*  PHOS  --   --   --  7.3*  --   HGB 13.4  --   --   --  11.9*   < > = values in this interval not displayed.   Inpatient medications: . aspirin  324 mg Oral Once  . atorvastatin  40 mg Per Tube Daily  . chlorhexidine gluconate (MEDLINE KIT)  15 mL Mouth Rinse BID  . Chlorhexidine Gluconate Cloth  6 each  Topical Q0600  . docusate  100 mg Oral BID  . feeding supplement (PROSource TF)  45 mL Per Tube BID  . feeding supplement (VITAL HIGH PROTEIN)  1,000 mL Per Tube Q24H  . insulin aspart  0-6 Units Subcutaneous Q4H  . mouth rinse  15 mL Mouth Rinse 10 times per day  . midodrine  10 mg Oral TID WC  . pantoprazole (PROTONIX) IV  40 mg Intravenous QHS  . polyethylene glycol  17 g Oral Daily  . tamsulosin  0.4 mg Oral QHS   . sodium chloride Stopped (11/10/19 2221)  . albumin human 25 g (11/11/19 0946)  . cefTRIAXone (ROCEPHIN)  IV 2 g (11/11/19 1052)  . feeding supplement (VITAL 1.5 CAL) 1,000 mL (11/11/19 0028)  . fentaNYL infusion INTRAVENOUS 200 mcg/hr (11/11/19 6720)  . lactated ringers 10 mL/hr at 11/11/19 0100  . norepinephrine (LEVOPHED) Adult infusion 10 mcg/min (11/11/19 9470)  . phenylephrine (NEO-SYNEPHRINE) Adult infusion 60 mcg/min (11/11/19 0833)  . propofol (DIPRIVAN) infusion 10 mcg/kg/min (11/11/19 1018)   sodium chloride, acetaminophen (TYLENOL) oral liquid 160 mg/5 mL, fentaNYL (SUBLIMAZE) injection, fentaNYL (SUBLIMAZE) injection, ipratropium-albuterol

## 2019-11-11 NOTE — Consult Note (Addendum)
Consultation Note Date: 11/11/2019   Patient Name: Ralph Becker  DOB: 07/27/1960  MRN: [id number]  Age / Sex: 59 y.o., male  PCP: Patient, No Pcp Per Referring Physician: Candee Furbish, MD  Reason for Consultation: Establishing goals of care  HPI/Patient Profile: 59 y.o. male  with past medical history of ESRD on dialysis TTS, hepatitis C, COPD, HTN, HLD, seizures, hepatic steatosis, degenerative disc disease, nicotine dependence, severe malnutrition with failure to thrive. Recent admission in June [facility] with COPD exacerbation requiring intubation and dialysis initiated. Admitted on 11/09/2019 with cardiac arrest at home and total time resuscitated 10-12 minutes with PEA and then VT/VF. Complicated by aspiration pneumonitis vs pneumonia, RV failure, shock liver.    Clinical Assessment and Goals of Care: I met today with Cailan and bedside RN. Jabarri only withdraws to pain currently but is on fentanyl and propofol currently. No family/visitors at bedside.   I reached out to brother, Ricki Clack. Paulo Fruit is next of kin as Anterio's parents are deceased and no other siblings per [place]. He does say Tyree has a son but unsure where he is or how to contact as he has not been in contact with his father in a long time. Roland himself has not been in contact with Jenny Reichmann in past 4-5 years. However, he is willing to act as surrogate decision maker. 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 He shares that there have never been any conversations regarding Leamon's wishes previously. However, with knowledge of the severity of his current illness with underlying chronic illness and failure to thrive Roland does agree that DNR would be best for Brayant at this time. He also does not feel that Tynan would ever want to be prolonged on machines long term and rely on others for his care. Roland  reports that Kaid has always lived day to day and has lived a difficult life. He does not believe he would want to live in a circumstance where he could not do as he desired or have to live in a facility long term. Paulo Fruit is hopeful that we can continue to support and that Kwali may have some improvement. However, he is open to consideration of comfort options with further decline or lack of improvement. He does agree that we can share information with significant other, Andria Meuse, but he will act as Media planner.   I did call Colletta Maryland and explained concern with Fines's poor prognosis and significant health decline over these past few months. I explained that I have spoken with Roland. I explained that we discussed DNR and that it would not be helpful to put Earle through further CPR at this stage and she agrees. She is sad and tearful but acknowledges that she does not want him to suffer.   All questions/concerns addressed. Emotional support provided. Updated Dr. Tamala Julian and RN.   Primary Decision Maker NEXT OF KIN brother Damauri Minion    SUMMARY OF RECOMMENDATIONS   - DNR -  No plans for trach/PEG - Desire to continue aggressive measures for now  Code Status/Advance Care Planning:  DNR   Symptom Management:   Per attending  Palliative Prophylaxis:   Delirium Protocol, Eye Care, Frequent Pain Assessment, Oral Care and Turn Reposition  Prognosis:   Overall prognosis poor.   Discharge Planning: To Be Determined      Primary Diagnoses: Present on Admission: . Cardiac arrest ([place])   I have reviewed the medical record, interviewed the patient and family, and examined the patient. The following aspects are pertinent.  Past Medical History:  Diagnosis Date  . Chronic hepatitis C ([place])   . COPD (chronic obstructive pulmonary disease) ([place]) 09/08/2019  . ESRD (end stage renal disease) ([place])    HD Tues, Thurs, Sat  . Essential hypertension   . Mixed hyperlipidemia   .  Nicotine dependence, cigarettes, uncomplicated   . Seizure disorder ([place]) 09/08/2019   Social History   Socioeconomic History  . Marital status: Single    Spouse name: Not on file  . Number of children: Not on file  . Years of education: Not on file  . Highest education level: Not on file  Occupational History  . Not on file  Tobacco Use  . Smoking status: Current Every Day Smoker  . Smokeless tobacco: Never Used  Substance and Sexual Activity  . Alcohol use: Never  . Drug use: Never  . Sexual activity: Not on file  Other Topics Concern  . Not on file  Social History Narrative  . Not on file   Social Determinants of Health   Financial Resource Strain:   . Difficulty of Paying Living Expenses: Not on file  Food Insecurity:   . Worried About Charity fundraiser in the Last Year: Not on file  . Ran Out of Food in the Last Year: Not on file  Transportation Needs:   . Lack of Transportation (Medical): Not on file  . Lack of Transportation (Non-Medical): Not on file  Physical Activity:   . Days of Exercise per Week: Not on file  . Minutes of Exercise per Session: Not on file  Stress:   . Feeling of Stress : Not on file  Social Connections:   . Frequency of Communication with Friends and Family: Not on file  . Frequency of Social Gatherings with Friends and Family: Not on file  . Attends Religious Services: Not on file  . Active Member of Clubs or Organizations: Not on file  . Attends Archivist Meetings: Not on file  . Marital Status: Not on file   Family History  Family history unknown: Yes   Scheduled Meds: . aspirin  324 mg Oral Once  . atorvastatin  40 mg Per Tube Daily  . chlorhexidine gluconate (MEDLINE KIT)  15 mL Mouth Rinse BID  . Chlorhexidine Gluconate Cloth  6 each Topical Q0600  . docusate  100 mg Oral BID  . feeding supplement (PROSource TF)  45 mL Per Tube BID  . feeding supplement (VITAL HIGH PROTEIN)  1,000 mL Per Tube Q24H  . insulin  aspart  0-6 Units Subcutaneous Q4H  . mouth rinse  15 mL Mouth Rinse 10 times per day  . midodrine  10 mg Oral TID WC  . pantoprazole (PROTONIX) IV  40 mg Intravenous QHS  . polyethylene glycol  17 g Oral Daily  . tamsulosin  0.4 mg Oral QHS   Continuous Infusions: . sodium chloride Stopped (11/10/19 2221)  . albumin human  25 g (11/11/19 0946)  . cefTRIAXone (ROCEPHIN)  IV 2 g (11/11/19 1052)  . feeding supplement (VITAL 1.5 CAL) 1,000 mL (11/11/19 0028)  . fentaNYL infusion INTRAVENOUS 200 mcg/hr (11/11/19 2574)  . lactated ringers 10 mL/hr at 11/11/19 0100  . norepinephrine (LEVOPHED) Adult infusion 10 mcg/min (11/11/19 9355)  . phenylephrine (NEO-SYNEPHRINE) Adult infusion 60 mcg/min (11/11/19 0833)  . propofol (DIPRIVAN) infusion 10 mcg/kg/min (11/11/19 1018)   PRN Meds:.sodium chloride, acetaminophen (TYLENOL) oral liquid 160 mg/5 mL, fentaNYL (SUBLIMAZE) injection, fentaNYL (SUBLIMAZE) injection, ipratropium-albuterol Allergies  Allergen Reactions  . Penicillins     Unknown. Can't remember.    Review of Systems  Unable to perform ROS: Acuity of condition    Physical Exam Vitals and nursing note reviewed.  Constitutional:      General: He is not in acute distress.    Appearance: He is cachectic. He is ill-appearing.     Interventions: He is intubated.  Cardiovascular:     Rate and Rhythm: Tachycardia present.  Pulmonary:     Effort: No tachypnea, accessory muscle usage or respiratory distress. He is intubated.  Abdominal:     General: Abdomen is flat.     Palpations: Abdomen is soft.  Neurological:     Comments: Sedated on vent Only withdraws to pain     Vital Signs: BP 117/81 (BP Location: Left Arm)   Pulse (!) 138   Temp 97.7 F (36.5 C) (Bladder)   Resp 16   Ht '5\' 9"'  (1.753 m)   Wt 60.2 kg   SpO2 95%   BMI 19.60 kg/m  Pain Scale: CPOT       SpO2: SpO2: 95 % O2 Device:SpO2: 95 % O2 Flow Rate: .   IO: Intake/output summary:   Intake/Output  Summary (Last 24 hours) at 11/11/2019 1055 Last data filed at 11/11/2019 0800 Gross per 24 hour  Intake 1632.29 ml  Output 575 ml  Net 1057.29 ml    LBM:   Baseline Weight: Weight: 60.8 kg Most recent weight: Weight: 60.2 kg     Palliative Assessment/Data:     Time In: 1500 Time Out: 1600 Time Total: 60 min Greater than 50%  of this time was spent counseling and coordinating care related to the above assessment and plan.  Signed by: [name], NP Palliative Medicine Team Pager # [phone] (M-F 8a-5p) Team Phone # [phone] (Nights/Weekends)

## 2019-11-11 NOTE — Progress Notes (Addendum)
Initial Nutrition Assessment  DOCUMENTATION CODES:   Severe malnutrition in context of chronic illness  INTERVENTION:   Finish liter bottle Vital High Protein, change to Vital AF 1.2 tomorrow  -Vital AF 1.2 @ 60 ml/hr (1440 ml) via OG  Provides: 1728 kcals, 108 grams protein, 1168 ml free water.   NUTRITION DIAGNOSIS:   Severe Malnutrition related to chronic illness (ESRD on HD) as evidenced by severe fat depletion, severe muscle depletion.  GOAL:   Patient will meet greater than or equal to 90% of their needs  MONITOR:   Vent status, Skin, TF tolerance, Weight trends, Labs, I & O's  REASON FOR ASSESSMENT:   Consult, Ventilator Enteral/tube feeding initiation and management  ASSESSMENT:   Patient with PMH significant for ESRD on HD, Hep C, COPD, HTN, HLD, and seizure disorder. Presents this admission with s/p cardiac arrest.   9/6- failed extubation trial   Pt discussed during ICU rounds and with RN.   Requiring low dose propofol. Tolerated HD yesterday- 1.2 L net UF. CT confirmed OG in place. Has Vital High Protein at 40 ml/hr running. Change formula to better meet needs once liter bottle Vital High Protein runs out.   EDW: 60.5 kg  Current weight: 60.2 kg   Patient is currently intubated on ventilator support MV: 8.9 L/min Temp (24hrs), Avg:98.7 F (37.1 C), Min:96.6 F (35.9 C), Max:99.3 F (37.4 C)   I/O: +1,813 ml since admit  UOP: 205 ml x 24 hrs    Drips: albumin, precedex, neosynephrine, propofol (3.65) Medications: colace, SS novolog, miralax Labs: K 5.6 (H) LFTs elevated Phosphorus 7.3 (H)   NUTRITION - FOCUSED PHYSICAL EXAM:    Most Recent Value  Orbital Region Severe depletion  Upper Arm Region Moderate depletion  Thoracic and Lumbar Region Unable to assess  Buccal Region Severe depletion  Temple Region Severe depletion  Clavicle Bone Region Severe depletion  Clavicle and Acromion Bone Region Severe depletion  Scapular Bone Region  Unable to assess  Dorsal Hand Mild depletion  Patellar Region Severe depletion  Anterior Thigh Region Severe depletion  Posterior Calf Region Severe depletion  Edema (RD Assessment) Mild  Hair Reviewed  Eyes Unable to assess  Mouth Unable to assess  Skin Reviewed  Nails Reviewed     Diet Order:   Diet Order            Diet NPO time specified  Diet effective now                 EDUCATION NEEDS:   Not appropriate for education at this time  Skin:  Skin Assessment: Reviewed RN Assessment  Last BM:  PTA  Height:   Ht Readings from Last 1 Encounters:  11/09/19 5\' 9"  (1.753 m)    Weight:   Wt Readings from Last 1 Encounters:  11/11/19 60.2 kg    BMI:  Body mass index is 19.6 kg/m.  Estimated Nutritional Needs:   Kcal:  1505-1806 kcal  Protein:  90-120 grams  Fluid:  1000 ml + UOP   Mariana Single RD, LDN Clinical Nutrition Pager listed in [place]

## 2019-11-11 NOTE — Progress Notes (Signed)
Patient transported to CT and back to 2H19 without incident.

## 2019-11-11 NOTE — Progress Notes (Addendum)
[place] Progress Note Patient Name: Ralph Becker DOB: Jun 12, 1960 MRN: [id number]   Date of Service  11/11/2019  HPI/Events of Note  HR 130 to 160.  Camera: Discussed with bed side RN. HR now 133, no fver. Sinus tachy.  Notes, labs reviewed.  S/p V fib arrest. Re intubated earlier in day.  Troponin flatten rise.  EF normal, severe RVD mostly from ESRD. Got HD earlier. No follow up on K yet.   eICU Interventions  - get stat BMP. Follow troponin and LA once.  - low dose metoprolol once. On 50 % fio2. - consider Cardiology consultation in AM     Intervention Category Intermediate Interventions: Arrhythmia - evaluation and management  Elmer Sow 11/11/2019, 12:39 AM   2:10 Labs are stabilizing.  Sinus tachy to a flutter RVR?Marland Kitchen Continue care for now. Will consider adenosis push if worsens , if not amiodarone gtt. On Levo at 10.

## 2019-11-11 NOTE — Progress Notes (Signed)
Per chat with Dr. Prudencio Burly - wishes to continue to just monitor pt's HR at this time.  HR is staying around 135, did not respond to lopressor dose. Bedside RN notified of wishes.

## 2019-11-11 NOTE — H&P (Addendum)
NAME:  Ralph Becker, MRN:  [id number], DOB:  07-25-1960, LOS: 1 ADMISSION DATE:  11/09/2019, CONSULTATION DATE:  11/10/19 REFERRING MD:  Christy Gentles, CHIEF COMPLAINT:  Cardiac arrest  Brief History    Ralph Becker is a 59 year old man hx of ESRD on dialysis t/th/s, Hep C, COPD, HTN, HLD, seizure hx (?),  s/p cardiac arrest.   History of present illness    Found down and unresponsive at home, last seen awake 5 min prior.  Found to be in asystolic cardiac arrest.  Received 4 min CPR, developed Vfib/vtach, defibrillated twice.  Total 10-12 minute resuscitation.  Intubated by EMS.   In the ED given keppra, calcium, insulin, D50, started on fentanyl and propofol.    Past Medical History  Lipitor, klonopin, flovent, gabapentin, hydralazine, metoprolol, bicarb BID, suboxone, flomax   Significant Hospital Events   9/6 admitted, extubation trial, failed due to secretion burden and poor cough  Consults:  Nephrology   Procedures:    Significant Diagnostic Tests:  CXR IMPRESSION: 1. Endotracheal tube tip is difficult to visualize but likely at the level of the clavicular heads. 2. Enteric tube in place with tip and side-port below the diaphragm in the stomach. 3. Increasing peribronchial and interstitial thickening, may be pulmonary edema or atypical infection.  CTA Chest- no PE, looks like aspiration   Micro Data:  Blood culture pending UA pending  Antimicrobials:    Interim history/subjective:  Failed extubation trial. Now intubated and sedated. Persistent sinus tachycardia prompted D dimer then PE study which were negative.  Objective   Blood pressure 117/81, pulse (!) 138, temperature 97.7 F (36.5 C), temperature source Bladder, resp. rate 16, height 5\' 9"  (1.753 m), weight 60.2 kg, SpO2 95 %. CVP:  [0 mmHg-20 mmHg] 9 mmHg  Vent Mode: PRVC FiO2 (%):  [45 %-50 %] 45 % Set Rate:  [16 bmp] 16 bmp Vt Set:  [570 mL] 570 mL PEEP:  [5 cmH20] 5 cmH20 Plateau Pressure:  [16 cmH20-22  cmH20] 18 cmH20   Intake/Output Summary (Last 24 hours) at 11/11/2019 5852 Last data filed at 11/11/2019 0800 Gross per 24 hour  Intake 1677.85 ml  Output 2039 ml  Net -361.15 ml   Filed Weights   11/09/19 2300 11/10/19 0500 11/11/19 0500  Weight: 60.8 kg 61 kg 60.2 kg    Examination: GEN: frail elderly man lying in bed on vent HEENT: severe temporal wasting, ETT in place with mucoid secretions CV: tachycardic, ext warm PULM: diminished with scattered rhonci, triggering vent GI: Soft, +BS EXT: Muscle wasting without edema NEURO: withdraws x 4, opens eyes to loud boice PSYCH: RASS -1 SKIN: No rashes, central line and tunnled HD line CDI  Labs reviewed, plts a bit down  Resolved Hospital Problem list     Assessment & Plan:  S/p Cardiac arrest: PEA, then vtach/vfib.  THC and benzos in urine.  Reportedly missed HD.  Initial K 7.4. - Avoid fevers - Was neurologically doing okay, following commands with extubation trial of 9/6  Aspiration pneumonitis vs. Pneumonia- with rise in white count, reasonable to give 5 days ceftriaxone, check Pct/Trach aspirate. He was not able to handle his secretions at all with extubation trial due to profound weakness.  Shock, RV failure, tachycardia, ?septic- ext warm, check co-ox, consider milrinone.  I think he has an extensive PMH but it is in systems outside of Epic that I do not have access to.  ESRD on HD, tachycardia- seems a bit dry to me, will  give some albumin and IVF  HepC, steatohepatitis, shock liver- trend, supportive care  Elevated INR, low plts- check DIC panel in AM  Severe protein calorie malnutrition, FTT, ESRD, 6 hospital admissions this year between [facility] and [facility]-  Palliative care consult, has a common law wife names Colletta Maryland but not sure she can make medical decisions for him  Best practice:  Diet: NPO   Pain/Anxiety/Delirium protocol (if indicated): on fentanyl and propofol  VAP protocol (if indicated): yes DVT  prophylaxis: heparin GI prophylaxis: protonix Glucose control: ISS  Mobility: Bed  Code Status: Full  Family Communication: no family, called "common law" wife Colletta Maryland yesterday Disposition: ICU     The patient is critically ill with multiple organ systems failure and requires high complexity decision making for assessment and support, frequent evaluation and titration of therapies, application of advanced monitoring technologies and extensive interpretation of multiple databases. Critical Care Time devoted to patient care services described in this note independent of APP/resident time (if applicable)  is 46 minutes.   Erskine Emery MD [facility] 11/11/2019 9:28 AM Personal pager: [phone] If unanswered, please page CCM On-call: [phone]

## 2019-11-12 LAB — CBC
HCT: 34.3 % — ABNORMAL LOW (ref 39.0–52.0)
Hemoglobin: 10.5 g/dL — ABNORMAL LOW (ref 13.0–17.0)
MCH: 28.6 pg (ref 26.0–34.0)
MCHC: 30.6 g/dL (ref 30.0–36.0)
MCV: 93.5 fL (ref 80.0–100.0)
Platelets: 48 10*3/uL — ABNORMAL LOW (ref 150–400)
RBC: 3.67 MIL/uL — ABNORMAL LOW (ref 4.22–5.81)
RDW: 16.6 % — ABNORMAL HIGH (ref 11.5–15.5)
WBC: 11 10*3/uL — ABNORMAL HIGH (ref 4.0–10.5)
nRBC: 0.2 % (ref 0.0–0.2)

## 2019-11-12 LAB — HEPATIC FUNCTION PANEL
ALT: 788 U/L — ABNORMAL HIGH (ref 0–44)
AST: 686 U/L — ABNORMAL HIGH (ref 15–41)
Albumin: 2.3 g/dL — ABNORMAL LOW (ref 3.5–5.0)
Alkaline Phosphatase: 148 U/L — ABNORMAL HIGH (ref 38–126)
Bilirubin, Direct: 2.5 mg/dL — ABNORMAL HIGH (ref 0.0–0.2)
Indirect Bilirubin: 1.4 mg/dL — ABNORMAL HIGH (ref 0.3–0.9)
Total Bilirubin: 3.9 mg/dL — ABNORMAL HIGH (ref 0.3–1.2)
Total Protein: 5.2 g/dL — ABNORMAL LOW (ref 6.5–8.1)

## 2019-11-12 LAB — DIC (DISSEMINATED INTRAVASCULAR COAGULATION)PANEL
D-Dimer, Quant: >20 ug/mL-FEU — ABNORMAL HIGH (ref 0.00–0.50)
Fibrinogen: 242 mg/dL (ref 210–475)
INR: 2.5 — ABNORMAL HIGH (ref 0.8–1.2)
Platelets: 51 10*3/uL — ABNORMAL LOW (ref 150–400)
Prothrombin Time: 26.2 seconds — ABNORMAL HIGH (ref 11.4–15.2)
Smear Review: NONE SEEN
aPTT: 40 seconds — ABNORMAL HIGH (ref 24–36)

## 2019-11-12 LAB — BASIC METABOLIC PANEL
Anion gap: 11 (ref 5–15)
BUN: 28 mg/dL — ABNORMAL HIGH (ref 8–23)
CO2: 25 mmol/L (ref 22–32)
Calcium: 7.7 mg/dL — ABNORMAL LOW (ref 8.9–10.3)
Chloride: 98 mmol/L (ref 98–111)
Creatinine, Ser: 4.61 mg/dL — ABNORMAL HIGH (ref 0.61–1.24)
GFR calc Af Amer: 15 mL/min — ABNORMAL LOW (ref >60)
GFR calc non Af Amer: 13 mL/min — ABNORMAL LOW (ref >60)
Glucose, Bld: 242 mg/dL — ABNORMAL HIGH (ref 70–99)
Potassium: 4.8 mmol/L (ref 3.5–5.1)
Sodium: 134 mmol/L — ABNORMAL LOW (ref 135–145)

## 2019-11-12 LAB — GLUCOSE, CAPILLARY
Glucose-Capillary: 140 mg/dL — ABNORMAL HIGH (ref 70–99)
Glucose-Capillary: 145 mg/dL — ABNORMAL HIGH (ref 70–99)
Glucose-Capillary: 200 mg/dL — ABNORMAL HIGH (ref 70–99)
Glucose-Capillary: 229 mg/dL — ABNORMAL HIGH (ref 70–99)
Glucose-Capillary: 229 mg/dL — ABNORMAL HIGH (ref 70–99)
Glucose-Capillary: 233 mg/dL — ABNORMAL HIGH (ref 70–99)

## 2019-11-12 LAB — TRIGLYCERIDES: Triglycerides: 222 mg/dL — ABNORMAL HIGH (ref <150)

## 2019-11-12 LAB — PHOSPHORUS
Phosphorus: 6.1 mg/dL — ABNORMAL HIGH (ref 2.5–4.6)
Phosphorus: 5.4 mg/dL — ABNORMAL HIGH (ref 2.5–4.6)

## 2019-11-12 LAB — MAGNESIUM
Magnesium: 1.9 mg/dL (ref 1.7–2.4)
Magnesium: 2 mg/dL (ref 1.7–2.4)

## 2019-11-12 MED ORDER — INSULIN ASPART 100 UNIT/ML ~~LOC~~ SOLN
0.0000 [IU] | SUBCUTANEOUS | Status: DC
  Administered 2019-11-12: 2 [IU] via SUBCUTANEOUS
  Administered 2019-11-12: 3 [IU] via SUBCUTANEOUS
  Administered 2019-11-12: 5 [IU] via SUBCUTANEOUS
  Administered 2019-11-13 (×2): 2 [IU] via SUBCUTANEOUS
  Administered 2019-11-13: 3 [IU] via SUBCUTANEOUS
  Administered 2019-11-14 – 2019-11-16 (×4): 2 [IU] via SUBCUTANEOUS

## 2019-11-12 MED ORDER — HYDROCORTISONE NA SUCCINATE PF 100 MG IJ SOLR
50.0000 mg | Freq: Two times a day (BID) | INTRAMUSCULAR | Status: AC
  Administered 2019-11-12 – 2019-11-13 (×3): 50 mg via INTRAVENOUS
  Filled 2019-11-12 (×3): qty 2

## 2019-11-12 NOTE — Progress Notes (Signed)
Palliative:  HPI: 59 y.o. male  with past medical history of ESRD on dialysis TTS, hepatitis C, COPD, HTN, HLD, seizures, hepatic steatosis, degenerative disc disease, nicotine dependence, severe malnutrition with failure to thrive. Recent admission in June [facility] with COPD exacerbation requiring intubation and dialysis initiated. Admitted on 11/09/2019 with cardiac arrest at home and total time resuscitated 10-12 minutes with PEA and then VT/VF. Complicated by aspiration pneumonitis vs pneumonia, RV failure, shock liver.    I met again today at Ralph Becker's bedside. He is more alert and following commands. Nodding his head yes/no. Difficult to truly assess orientation and understanding on ventilator. No plans for extubation today per RN due to ongoing concern for secretions. I will continue to follow and will attempt Ralph Becker with patient at time of extubation but if he cannot make decisions his brother is his surrogate decision-maker. I will continue to follow along daily.   Exam: Alert, following commands. Tolerating vent via ETT on PRVC 40% FiO2. No distress. No tachypnea or accessory muscle use. Abd soft, flat.   Plan: - DNR and no trach/PEG per brother.  - Will attempt to readdress goals with patient as able.   15 min  [name], NP Palliative Medicine Team Pager [phone] (Please see [email or web address] for schedule) Team Phone [phone]    Greater than 50%  of this time was spent counseling and coordinating care related to the above assessment and plan

## 2019-11-12 NOTE — Progress Notes (Signed)
NAME:  Ralph Becker, MRN:  [id number], DOB:  [birth year], LOS: 2 ADMISSION DATE:  11/09/2019, CONSULTATION DATE:  11/10/19 REFERRING MD:  Christy Gentles, CHIEF COMPLAINT:  Cardiac arrest  Brief History    Ralph Becker is a 59 year old man hx of ESRD on dialysis t/th/s, Hep C, COPD, HTN, HLD, seizure hx (?),  s/p cardiac arrest.   History of present illness    Found down and unresponsive at home, last seen awake 5 min prior.  Found to be in asystolic cardiac arrest.  Received 4 min CPR, developed Vfib/vtach, defibrillated twice.  Total 10-12 minute resuscitation.  Intubated by EMS.   In the ED given keppra, calcium, insulin, D50, started on fentanyl and propofol.    Past Medical History  Lipitor, klonopin, flovent, gabapentin, hydralazine, metoprolol, bicarb BID, suboxone, flomax   Significant Hospital Events   9/6 admitted, extubation trial, failed due to secretion burden and poor cough  Consults:  Nephrology   Procedures:    Significant Diagnostic Tests:  CXR IMPRESSION: 1. Endotracheal tube tip is difficult to visualize but likely at the level of the clavicular heads. 2. Enteric tube in place with tip and side-port below the diaphragm in the stomach. 3. Increasing peribronchial and interstitial thickening, may be pulmonary edema or atypical infection.  CTA Chest- no PE, looks like aspiration   Micro Data:  Blood culture pending UA pending  Antimicrobials:    Interim history/subjective:  On vasopressin alone. Following commands. Thick tenacious secretions persist.   Objective   Blood pressure 112/76, pulse 78, temperature 98.8 F (37.1 C), resp. rate (!) 21, height 5\' 9"  (1.753 m), weight 65 kg, SpO2 96 %.    Vent Mode: PRVC FiO2 (%):  [40 %] 40 % Set Rate:  [16 bmp] 16 bmp Vt Set:  [520 mL-570 mL] 520 mL PEEP:  [5 cmH20] 5 cmH20 Pressure Support:  [8 cmH20] 8 cmH20 Plateau Pressure:  [17 cmH20-25 cmH20] 25 cmH20   Intake/Output Summary (Last 24 hours) at 11/12/2019  0925 Last data filed at 11/12/2019 0700 Gross per 24 hour  Intake 4589.09 ml  Output -70 ml  Net 4659.09 ml   Filed Weights   11/11/19 0500 11/11/19 1940 11/12/19 0441  Weight: 60.2 kg 60.2 kg 65 kg    Examination: GEN: frail elderly man lying in bed on vent HEENT: severe temporal wasting, ETT in place with copious thick secretions CV: tachycardic, ext warm PULM: diminished with scattered rhonci, triggering vent GI: Soft, +BS EXT: Muscle wasting without edema NEURO: moving all 4 ext to command PSYCH: RASS 0 SKIN: No rashes, central line and tunnled HD line CDI  Labs reviewed, plts a bit down  Resolved Hospital Problem list     Assessment & Plan:  S/p Cardiac arrest: PEA, then vtach/vfib.  THC and benzos in urine.  Reportedly missed HD.  Initial K 7.4. - Avoid fevers - No obvious neurological sequelae except maybe worsening ability to handle secretions  Aspiration pneumonitis vs. Pneumonia- with rise in white count, reasonable to give 5 days ceftriaxone, check Pct/Trach aspirate. He was not able to handle his secretions at all with extubation trial due to profound weakness. - Continue vent support - Secretion burden and cough weakness precludes extubation at this time  Shock, RV failure, tachycardia, ?septic- improved, totality of evidence suggests mostly septic shock. - Continue midodrine - Wean vasopressin and stress steroids as able  Heart rhythm issues- was in sinus tach all day yesterday then went into a slower Afib then  cardioverted with amiodarone and became bradycardic, he is now on no antiarrhythmics - Telemetry, continue PTA metoprolol  ESRD on HD-  Received HD 9/7  HepC, steatohepatitis, shock liver, DIC- trend, supportive care, hold heparin for now  Severe protein calorie malnutrition, FTT, ESRD, 6 hospital admissions this year between [place] and [facility]-  appreciate palliative care input  Best practice:  Diet: TF  Pain/Anxiety/Delirium protocol (if  indicated): on fentanyl and propofol  VAP protocol (if indicated): yes DVT prophylaxis: heparin GI prophylaxis: protonix Glucose control: ISS  Mobility: Bed  Code Status: Full  Family Communication: pending Disposition: ICU   The patient is critically ill with multiple organ systems failure and requires high complexity decision making for assessment and support, frequent evaluation and titration of therapies, application of advanced monitoring technologies and extensive interpretation of multiple databases. Critical Care Time devoted to patient care services described in this note independent of APP/resident time (if applicable)  is 42 minutes.   Erskine Emery MD [facility] 11/12/2019 9:25 AM Personal pager: [phone] If unanswered, please page CCM On-call: [phone]

## 2019-11-12 NOTE — Progress Notes (Signed)
[facility] Progress Note  Subjective:   Vitals:   11/12/19 1000 11/12/19 1100 11/12/19 1104 11/12/19 1200  BP: 107/75 108/76  100/70  Pulse: 69 71 74 64  Resp: '18 16 19 16  ' Temp: 99 F (37.2 C) 99.1 F (37.3 C) 99.1 F (37.3 C) 99 F (37.2 C)  TempSrc:    Bladder  SpO2: 93% 95% 95% 94%  Weight:      Height:        Exam: Gen on vent, eyes open and tracking Sclera anicteric, throat w/  ETT and OG tube No jvd or bruits Chest bilat insp rales at the bases RRR no MRG Abd soft ntnd no mass or ascites +bs Ext 1+ LE edema bilat Neuro is alert, not responding much R IJ TDC exit c/d     CXR - IMPRESSION: 1. Endotracheal tube tip is difficult to visualize but likely at the level of the clavicular heads. 2. Enteric tube in place with tip and side-port below the diaphragm in the stomach. 3. Increasing peribronchial and interstitial thickening, may be pulmonary edema or atypical infection.   Home meds:  - lipitor 40/ hydralazine 50 bid/ metoprolol 25 bid/ sod bicarb bid  - suboxone 8-2 mg film 1 qd/ klonopin 1 mg hs/ neurontin 300 qd  - flomax/  prn's/ vitamins/ supplements   OP HD: TTS [facility]  4h  2/2.25 bath  TDC  60.5kg   Hep 4000  - calc 0.25 ug po tiw   CXR 9/6 - IMPRESSION: 1. Left subclavian central venous catheter tip projects over the mid SVC.... 4. Unchanged peribronchial and interstitial thickening.     CTA chest 9/7 yest - IMPRESSION: 1. No evidence for pulmonary embolus. 2. Patchy airspace opacities at the lung apices bilaterally, right greater than left. Findings are concerning for pneumonia. 3. Small right pleural effusion. 4. Endotracheal tube and NG tube are in satisfactory position. 5. Aortic Atherosclerosis (ICD10-I70.0).    Assessment/ Plan: 1. Cardiac arrest - PEA, then vtach/ vib. No further issues.  2. Resp arrest/ prob PNA - on vent, asp pneumonitis vs PNA. On IV abx.  3. Shock/ RV failure/ tachy - weaning pressors 4. ESRD -  usual HD TTS. HD yesterday. Plan HD tomorrow.  5. Hyperkalemia - severe, better. Down to 4.8 today.  6. ^LFT's - shock liver 7. Vol - got 5 L IVF's yesterday, up 5kg over edw today. IVF's dc'd. Will not be able to pull fluid on HD tomorrow given hypotension. Pt is now DNR given recent decline in health and QOL this year. Will cont HD as long as BP can tolerate. Would not consider CRRT a good option given DNR status and overall declining health.  8. COPD 9. H/o opioid abuse - on suboxone 10. H/o seizure d/o - given IV keppra in the ED 11. DNR - seen by pall care, pt now is DNR     Rob Maddex Garlitz 11/12/2019, 1:32 PM   Recent Labs  Lab 11/11/19 0417 11/11/19 1700 11/11/19 1708 11/12/19 0428  K   < >  --  5.0 4.8  BUN   < >  --  48* 28*  CREATININE   < >  --  7.70* 4.61*  CALCIUM   < >  --  7.3* 7.7*  PHOS   < >  --  7.6* 6.1*  HGB  --  10.9*  --  10.5*   < > = values in this interval not displayed.   Inpatient medications: .  aspirin  324 mg Oral Once  . atorvastatin  40 mg Per Tube Daily  . chlorhexidine gluconate (MEDLINE KIT)  15 mL Mouth Rinse BID  . Chlorhexidine Gluconate Cloth  6 each Topical Q0600  . docusate  100 mg Per Tube BID  . hydrocortisone sod succinate (SOLU-CORTEF) inj  50 mg Intravenous Q12H  . insulin aspart  0-15 Units Subcutaneous Q4H  . mouth rinse  15 mL Mouth Rinse 10 times per day  . metoprolol tartrate  25 mg Oral BID  . midodrine  10 mg Per Tube TID WC  . multivitamin  1 tablet Oral QHS  . pantoprazole (PROTONIX) IV  40 mg Intravenous QHS  . polyethylene glycol  17 g Oral Daily   . sodium chloride Stopped (11/10/19 2221)  . cefTRIAXone (ROCEPHIN)  IV 2 g (11/12/19 1791)  . dexmedetomidine (PRECEDEX) IV infusion 0.9 mcg/kg/hr (11/11/19 2041)  . feeding supplement (VITAL AF 1.2 CAL) 1,000 mL (11/12/19 0947)  . fentaNYL infusion INTRAVENOUS 200 mcg/hr (11/12/19 0700)  . norepinephrine (LEVOPHED) Adult infusion Stopped (11/11/19 2118)  . vasopressin  0.04 Units/min (11/12/19 0837)   sodium chloride, acetaminophen (TYLENOL) oral liquid 160 mg/5 mL, fentaNYL (SUBLIMAZE) injection, fentaNYL (SUBLIMAZE) injection, heparin, ipratropium-albuterol

## 2019-11-13 ENCOUNTER — Inpatient Hospital Stay (HOSPITAL_COMMUNITY): Payer: Medicaid Other

## 2019-11-13 LAB — GLUCOSE, CAPILLARY
Glucose-Capillary: 102 mg/dL — ABNORMAL HIGH (ref 70–99)
Glucose-Capillary: 108 mg/dL — ABNORMAL HIGH (ref 70–99)
Glucose-Capillary: 111 mg/dL — ABNORMAL HIGH (ref 70–99)
Glucose-Capillary: 141 mg/dL — ABNORMAL HIGH (ref 70–99)
Glucose-Capillary: 163 mg/dL — ABNORMAL HIGH (ref 70–99)
Glucose-Capillary: 194 mg/dL — ABNORMAL HIGH (ref 70–99)

## 2019-11-13 LAB — BASIC METABOLIC PANEL
Anion gap: 11 (ref 5–15)
BUN: 55 mg/dL — ABNORMAL HIGH (ref 8–23)
CO2: 27 mmol/L (ref 22–32)
Calcium: 7.6 mg/dL — ABNORMAL LOW (ref 8.9–10.3)
Chloride: 100 mmol/L (ref 98–111)
Creatinine, Ser: 5.84 mg/dL — ABNORMAL HIGH (ref 0.61–1.24)
GFR calc Af Amer: 11 mL/min — ABNORMAL LOW (ref >60)
GFR calc non Af Amer: 10 mL/min — ABNORMAL LOW (ref >60)
Glucose, Bld: 210 mg/dL — ABNORMAL HIGH (ref 70–99)
Potassium: 5.1 mmol/L (ref 3.5–5.1)
Sodium: 138 mmol/L (ref 135–145)

## 2019-11-13 LAB — CBC
HCT: 32.7 % — ABNORMAL LOW (ref 39.0–52.0)
Hemoglobin: 10.5 g/dL — ABNORMAL LOW (ref 13.0–17.0)
MCH: 28.9 pg (ref 26.0–34.0)
MCHC: 32.1 g/dL (ref 30.0–36.0)
MCV: 90.1 fL (ref 80.0–100.0)
Platelets: 54 10*3/uL — ABNORMAL LOW (ref 150–400)
RBC: 3.63 MIL/uL — ABNORMAL LOW (ref 4.22–5.81)
RDW: 16.5 % — ABNORMAL HIGH (ref 11.5–15.5)
WBC: 15.8 10*3/uL — ABNORMAL HIGH (ref 4.0–10.5)
nRBC: 0.1 % (ref 0.0–0.2)

## 2019-11-13 LAB — CULTURE, RESPIRATORY W GRAM STAIN

## 2019-11-13 LAB — HEPATIC FUNCTION PANEL
ALT: 621 U/L — ABNORMAL HIGH (ref 0–44)
AST: 347 U/L — ABNORMAL HIGH (ref 15–41)
Albumin: 2.3 g/dL — ABNORMAL LOW (ref 3.5–5.0)
Alkaline Phosphatase: 216 U/L — ABNORMAL HIGH (ref 38–126)
Bilirubin, Direct: 1.3 mg/dL — ABNORMAL HIGH (ref 0.0–0.2)
Indirect Bilirubin: 0.7 mg/dL (ref 0.3–0.9)
Total Bilirubin: 2 mg/dL — ABNORMAL HIGH (ref 0.3–1.2)
Total Protein: 5.3 g/dL — ABNORMAL LOW (ref 6.5–8.1)

## 2019-11-13 MED ORDER — SODIUM CHLORIDE 0.9% FLUSH
10.0000 mL | Freq: Two times a day (BID) | INTRAVENOUS | Status: DC
  Administered 2019-11-13 (×2): 10 mL
  Administered 2019-11-13: 20 mL
  Administered 2019-11-14 – 2019-11-16 (×3): 10 mL

## 2019-11-13 MED ORDER — MIDAZOLAM HCL 2 MG/2ML IJ SOLN: 2.0000 mg | INTRAMUSCULAR | Status: DC | PRN

## 2019-11-13 MED ORDER — HEPARIN SODIUM (PORCINE) 1000 UNIT/ML IJ SOLN: 4000.0000 [IU] | Freq: Once | INTRAMUSCULAR | Status: AC

## 2019-11-13 MED ORDER — ORAL CARE MOUTH RINSE
15.0000 mL | Freq: Two times a day (BID) | OROMUCOSAL | Status: DC
  Administered 2019-11-13 – 2019-11-16 (×6): 15 mL via OROMUCOSAL

## 2019-11-13 MED ORDER — ATORVASTATIN CALCIUM 40 MG PO TABS
40.0000 mg | ORAL_TABLET | Freq: Every day | ORAL | Status: DC
  Administered 2019-11-14 – 2019-11-16 (×3): 40 mg via ORAL
  Filled 2019-11-13 (×3): qty 1

## 2019-11-13 MED ORDER — METOPROLOL TARTRATE 5 MG/5ML IV SOLN
2.5000 mg | Freq: Four times a day (QID) | INTRAVENOUS | Status: DC | PRN
  Administered 2019-11-15: 5 mg via INTRAVENOUS
  Filled 2019-11-13 (×2): qty 5

## 2019-11-13 MED ORDER — HEPARIN SODIUM (PORCINE) 1000 UNIT/ML IJ SOLN
1000.0000 [IU] | INTRAMUSCULAR | Status: DC | PRN
  Administered 2019-11-13: 3600 [IU] via INTRAVENOUS
  Filled 2019-11-13: qty 1

## 2019-11-13 MED ORDER — DOCUSATE SODIUM 100 MG PO CAPS
100.0000 mg | ORAL_CAPSULE | Freq: Two times a day (BID) | ORAL | Status: DC
  Administered 2019-11-14 – 2019-11-16 (×5): 100 mg via ORAL
  Filled 2019-11-13 (×5): qty 1

## 2019-11-13 MED ORDER — MIDODRINE HCL 5 MG PO TABS
10.0000 mg | ORAL_TABLET | Freq: Three times a day (TID) | ORAL | Status: DC
  Filled 2019-11-13: qty 2

## 2019-11-13 MED ORDER — HEPARIN SODIUM (PORCINE) 5000 UNIT/ML IJ SOLN
5000.0000 [IU] | Freq: Three times a day (TID) | INTRAMUSCULAR | Status: DC
  Administered 2019-11-13 – 2019-11-15 (×6): 5000 [IU] via SUBCUTANEOUS
  Filled 2019-11-13 (×6): qty 1

## 2019-11-13 MED ORDER — SODIUM CHLORIDE 0.9% FLUSH: 10.0000 mL | INTRAVENOUS | Status: DC | PRN

## 2019-11-13 MED ORDER — MIDAZOLAM HCL 2 MG/2ML IJ SOLN
1.0000 mg | INTRAMUSCULAR | Status: DC | PRN
  Administered 2019-11-13: 1 mg via INTRAVENOUS
  Filled 2019-11-13: qty 2

## 2019-11-13 MED ORDER — ACETAMINOPHEN 325 MG PO TABS
650.0000 mg | ORAL_TABLET | Freq: Four times a day (QID) | ORAL | Status: DC | PRN
  Administered 2019-11-15 – 2019-11-18 (×5): 650 mg via ORAL
  Filled 2019-11-13 (×6): qty 2

## 2019-11-13 MED ORDER — HEPARIN SODIUM (PORCINE) 1000 UNIT/ML IJ SOLN
INTRAMUSCULAR | Status: AC
  Administered 2019-11-13: 4000 [IU] via INTRAVENOUS
  Filled 2019-11-13: qty 4

## 2019-11-13 NOTE — Progress Notes (Signed)
NAME:  Ralph Becker, MRN:  [id number], DOB:  03/15/1960, LOS: 3 ADMISSION DATE:  11/09/2019, CONSULTATION DATE:  11/10/19 REFERRING MD:  Christy Gentles, CHIEF COMPLAINT:  Cardiac arrest  Brief History    Ralph Becker is a 59 year old man hx of ESRD on dialysis t/th/s, Hep C, COPD, HTN, HLD, seizure hx (?),  s/p cardiac arrest.   History of present illness    Found down and unresponsive at home, last seen awake 5 min prior.  Found to be in asystolic cardiac arrest.  Received 4 min CPR, developed Vfib/vtach, defibrillated twice.  Total 10-12 minute resuscitation.  Intubated by EMS.   In the ED given keppra, calcium, insulin, D50, started on fentanyl and propofol.    Past Medical History  Lipitor, klonopin, flovent, gabapentin, hydralazine, metoprolol, bicarb BID, suboxone, flomax   Significant Hospital Events   9/6 admitted, extubation trial, failed due to secretion burden and poor cough  Consults:  Nephrology   Procedures:    Significant Diagnostic Tests:  CXR IMPRESSION: 1. Endotracheal tube tip is difficult to visualize but likely at the level of the clavicular heads. 2. Enteric tube in place with tip and side-port below the diaphragm in the stomach. 3. Increasing peribronchial and interstitial thickening, may be pulmonary edema or atypical infection.  CTA Chest- no PE, looks like aspiration   Micro Data:  Blood culture pending UA pending  Antimicrobials:    Interim history/subjective:  No events, awake and following commands briskly  Objective   Blood pressure (!) 135/95, pulse (!) 111, temperature 97.7 F (36.5 C), resp. rate 17, height 5\' 9"  (1.753 m), weight 68.3 kg, SpO2 94 %.    Vent Mode: PSV;CPAP FiO2 (%):  [40 %] 40 % Set Rate:  [16 bmp] 16 bmp Vt Set:  [520 mL] 520 mL PEEP:  [5 cmH20] 5 cmH20 Pressure Support:  [8 cmH20] 8 cmH20 Plateau Pressure:  [17 cmH20-25 cmH20] 21 cmH20   Intake/Output Summary (Last 24 hours) at 11/13/2019 0843 Last data filed at  11/13/2019 0400 Gross per 24 hour  Intake 1437.49 ml  Output 95 ml  Net 1342.49 ml   Filed Weights   11/12/19 0441 11/13/19 0500 11/13/19 0656  Weight: 65 kg 65.1 kg 68.3 kg    Examination: GEN: frail elderly man lying in bed on vent HEENT: severe temporal wasting, ETT in place with more thin secretions today CV: tachycardic, ext warm PULM: diminished with scattered rhonci, triggering vent GI: Soft, +BS EXT: Muscle wasting without edema NEURO: moving all 4 ext to command PSYCH: RASS 0 SKIN: No rashes, central line and tunnled HD line CDI  LFTs improving Plts 54  Resolved Hospital Problem list     Assessment & Plan:  S/p Cardiac arrest: PEA, then vtach/vfib.  THC and benzos in urine.  Reportedly missed HD.  Initial K 7.4. - Avoid fevers - No obvious neurological sequelae except maybe worsening ability to handle secretions  Aspiration pneumonitis vs. Pneumonia- with rise in white count, reasonable to give 5 days ceftriaxone, check Pct/Trach aspirate. He was not able to handle his secretions at all with extubation trial due to profound weakness. - Continue vent support - Repeat trial of extubation today  Shock, RV failure, tachycardia, ?septic- improved, totality of evidence suggests mostly septic shock. - Continue midodrine - Stress steroids have been weaned off  H influenza pneumonia- ceftriaxone x 7 days  Heart rhythm issues- was in sinus tach all day yesterday then went into a slower Afib then cardioverted with  amiodarone and became bradycardic, he is now on no antiarrhythmics - Telemetry, continue PTA metoprolol  ESRD on HD-  Continue iHD  HepC, steatohepatitis, shock liver, DIC- trend, supportive care, restart heparin ppx for now, consider full dose AC if plts continue to improve  Severe protein calorie malnutrition, FTT, ESRD, 6 hospital admissions this year between [place] and [facility]-  appreciate palliative care input, poor trach candidate  Best practice:  Diet:  TF  Pain/Anxiety/Delirium protocol (if indicated): wean VAP protocol (if indicated): yes DVT prophylaxis: heparin GI prophylaxis: protonix Glucose control: ISS  Mobility: Bed  Code Status: Full  Family Communication: called brother, no answer Disposition: ICU   The patient is critically ill with multiple organ systems failure and requires high complexity decision making for assessment and support, frequent evaluation and titration of therapies, application of advanced monitoring technologies and extensive interpretation of multiple databases. Critical Care Time devoted to patient care services described in this note independent of APP/resident time (if applicable)  is 32 minutes.   Erskine Emery MD [facility] 11/13/2019 8:43 AM Personal pager: [phone] If unanswered, please page CCM On-call: [phone]

## 2019-11-13 NOTE — Progress Notes (Signed)
[facility] Progress Note Patient Name: Ralph Becker DOB: 14-Jun-1960 MRN: [id number]   Date of Service  11/13/2019  HPI/Events of Note  Notified that patient is gagging on ETT.   59/M with ESRD, seizure hx, COPD, s/p cardiac arrest.    Pt is on fentanyl gtt at 23mcg.  Precedex held due to bradycardia.  Pt is waking up more.  He is saturating at 95%, PEEP 5, 40% FiO2.    eICU Interventions  Give versed 1mg  PRN.      Intervention Category Intermediate Interventions: Other:  Ralph Becker 11/13/2019, 4:21 AM

## 2019-11-13 NOTE — Progress Notes (Addendum)
Palliative:  HPI: 59 y.o.malewith past medical history of ESRD on dialysis TTS, hepatitis C, COPD, HTN, HLD, seizures, hepatic steatosis, degenerative disc disease, nicotine dependence, severe malnutrition with failure to thrive. Recent admission in June [facility] with COPD exacerbation requiring intubation and dialysis initiated. Admitted on9/5/2021with cardiac arrest at home and total time resuscitated 10-12 minutes with PEA and then VT/VF.Complicated by aspiration pneumonitis vs pneumonia, RV failure, shock liver.   I met today with Ralph Becker after he was extubated. He is alert and he talks. He is still struggling with delayed responses and only oriented to person and year. He is unable to tell me anything about his medical history and unable to tell me he is in a hospital. He is unable to answer any complex questions. He does give me permission to call his brother and confirms that he trusts his brother.   I called and spoke with brother, Paulo Fruit Coye calls him Edd Arbour). I updated him on extubation but Hager is confused and does not have capacity. We discussed concern that he could decline and what we should do. I reiterated concern that although he is doing better than he was (weaning well and more alert) he continues with severe underlying issues. Discussed frailty and failure to thrive prior to acute issues. I am worried that he is not strong enough to overcome this illness. Roland understands but tells me that he would want Aidenjames to be reintubated if needed since he has shown so much improvement since last we spoke. Roland still holds firm that Sayed would not want trach/PEG long term. We discussed that if this is the case then one more intubation would likely be the last if he declines as ongoing reintubation would only be warranted with tracheostomy.   Goal for now is reintubation if indicated. He has wet cough and struggling to expectorate so high risk for reintubation. Otherwise continue all efforts  to medically optimize. Brother, Paulo Fruit, is Air traffic controller next of kin. Roland wishes to continue to take this one day at a time for decisions.   All questions/concerns addressed. Emotional support provided. Updated Dr. Tamala Julian.   Exam: Alert, confused, oriented only to person and year. Disoriented to place, situation with poor insight and judgement. Breathing regular, unlabored on 10L oxygen with congested cough and poor expectoration. Abd soft, flat.   Plan: - Reintubate if indicated.  - Continue aggressive care.  - No trach/PEG.   [place], NP Palliative Medicine Team Pager [phone] (Please see [email or web address] for schedule) Team Phone [phone]    Greater than 50%  of this time was spent counseling and coordinating care related to the above assessment and plan

## 2019-11-13 NOTE — Progress Notes (Signed)
[facility] Progress Note Patient Name: Ralph Becker DOB: Sep 05, 1960 MRN: [id number]   Date of Service  11/13/2019  HPI/Events of Note  Patient with Afib with RVR, unable to take his po Metoprolol.  eICU Interventions   iv Metoprolol ordered        Frederik Pear 11/13/2019, 9:12 PM

## 2019-11-13 NOTE — Progress Notes (Signed)
[facility] Progress Note  Subjective: pt seen in ICU, eyes open . Responds well.   Vitals:   11/13/19 1128 11/13/19 1200 11/13/19 1300 11/13/19 1400  BP:  (!) 141/97 (!) 122/91 137/87  Pulse:  96 (!) 179 96  Resp:  '17 18 17  ' Temp:      TempSrc:      SpO2: 94% 100% 94% 99%  Weight:      Height:        Exam: Gen on vent, awake Sclera anicteric, throat w/  ETT and OG tube No jvd or bruits Chest bilat insp rales at the bases RRR no MRG Abd soft ntnd no mass or ascites +bs Ext 1+ LE edema bilat hips Neuro is alert, not responding much R IJ TDC exit c/d     CXR - IMPRESSION: 1. Endotracheal tube tip is difficult to visualize but likely at the level of the clavicular heads. 2. Enteric tube in place with tip and side-port below the diaphragm in the stomach. 3. Increasing peribronchial and interstitial thickening, may be pulmonary edema or atypical infection.   Home meds:  - lipitor 40/ hydralazine 50 bid/ metoprolol 25 bid/ sod bicarb bid  - suboxone 8-2 mg film 1 qd/ klonopin 1 mg hs/ neurontin 300 qd  - flomax/  prn's/ vitamins/ supplements    CXR 9/6 - IMPRESSION: 1. Left subclavian central venous catheter tip projects over the mid SVC.... 4. Unchanged peribronchial and interstitial thickening.     CTA chest 9/7 yest - IMPRESSION: 1. No evidence for pulmonary embolus. 2. Patchy airspace opacities at the lung apices bilaterally, right greater than left. Findings are concerning for pneumonia. 3. Small right pleural effusion. 4. Endotracheal tube and NG tube are in satisfactory position. 5. Aortic Atherosclerosis (ICD10-I70.0).    OP HD: [facility]  4h  2/2.25 bath  TDC  60.5kg   Hep 4000  - calc 0.25 ug po tiw  Assessment/ Plan: 1. Cardiac arrest - PEA, then vtach/ vib. No further issues.  2. Resp arrest/ prob PNA - on vent, asp pneumonitis vs PNA, on abx. Unable to extubate d/t sig weakness , poor secretion handling  3. Shock/ tachycardia-   getting midodrine, pressors per ccm 4. ESRD - usual HD TTS. HD today in ICU.  5. Hyperkalemia - severe, resolved w/ HD.  6. Shock liver/ DIC/ low plts - getting heparin ppx per ccm 7. Vol - up 6-7kg by wts, CXR not sig wet. 1.5 L off today.  8. COPD 9. H/o opioid abuse - on suboxone 10. H/o seizure d/o - given IV keppra in the ED 11. Partial DNR - no CPR/ shock/ meds     Rob Tyechia Allmendinger 11/13/2019, 2:49 PM   Recent Labs  Lab 11/12/19 0428 11/12/19 2307 11/13/19 0359  K 4.8  --  5.1  BUN 28*  --  55*  CREATININE 4.61*  --  5.84*  CALCIUM 7.7*  --  7.6*  PHOS 6.1* 5.4*  --   HGB 10.5*  --  10.5*   Inpatient medications: . aspirin  324 mg Oral Once  . atorvastatin  40 mg Per Tube Daily  . chlorhexidine gluconate (MEDLINE KIT)  15 mL Mouth Rinse BID  . Chlorhexidine Gluconate Cloth  6 each Topical Q0600  . docusate  100 mg Per Tube BID  . heparin injection (subcutaneous)  5,000 Units Subcutaneous Q8H  . hydrocortisone sod succinate (SOLU-CORTEF) inj  50 mg Intravenous Q12H  . insulin aspart  0-15 Units  Subcutaneous Q4H  . mouth rinse  15 mL Mouth Rinse 10 times per day  . metoprolol tartrate  25 mg Oral BID  . midodrine  10 mg Per Tube TID WC  . multivitamin  1 tablet Oral QHS  . pantoprazole (PROTONIX) IV  40 mg Intravenous QHS  . polyethylene glycol  17 g Oral Daily  . sodium chloride flush  10-40 mL Intracatheter Q12H   . sodium chloride Stopped (11/10/19 2221)  . cefTRIAXone (ROCEPHIN)  IV 2 g (11/13/19 1025)  . feeding supplement (VITAL AF 1.2 CAL) 60 mL/hr at 11/13/19 0400  . fentaNYL infusion INTRAVENOUS 200 mcg/hr (11/13/19 0416)  . norepinephrine (LEVOPHED) Adult infusion Stopped (11/11/19 2118)  . vasopressin 0.01 Units/min (11/13/19 0652)   sodium chloride, acetaminophen (TYLENOL) oral liquid 160 mg/5 mL, fentaNYL (SUBLIMAZE) injection, fentaNYL (SUBLIMAZE) injection, heparin sodium (porcine), ipratropium-albuterol, midazolam, sodium chloride flush

## 2019-11-13 NOTE — Procedures (Signed)
Extubation Procedure Note  Patient Details:   Name: Ralph Becker DOB: Dec 28, 1960 MRN: [id number]   Airway Documentation:    Vent end date: 11/13/19 Vent end time: 1120   Evaluation  O2 sats: currently acceptable Complications: Complications of desaturation Patient did tolerate procedure well. Bilateral Breath Sounds: Diminished, Rhonchi   Yes   Pt was extubated to 4L [place] per CCMD order. Pt saturations fell into the 70s, RT placed pt on 10L HFNC [place] and pt saturations came back up slowly into the 90s, currently sating 96%. Pt was able to speak afterwards and was suctioned prior to extubation. Pt has a very strong cough, no stridor heard at this time, pt had a positive cuff leak before procedure. RN made aware and is at bedside with pt at this time. RT will continue to monitor.   Itali Mckendry A Emine Lopata 11/13/2019, 11:28 AM

## 2019-11-14 ENCOUNTER — Inpatient Hospital Stay (HOSPITAL_COMMUNITY): Payer: Medicaid Other

## 2019-11-14 DIAGNOSIS — J9601 Acute respiratory failure with hypoxia: Secondary | ICD-10-CM

## 2019-11-14 LAB — BASIC METABOLIC PANEL
Anion gap: 11 (ref 5–15)
BUN: 51 mg/dL — ABNORMAL HIGH (ref 8–23)
CO2: 26 mmol/L (ref 22–32)
Calcium: 8.3 mg/dL — ABNORMAL LOW (ref 8.9–10.3)
Chloride: 99 mmol/L (ref 98–111)
Creatinine, Ser: 4.99 mg/dL — ABNORMAL HIGH (ref 0.61–1.24)
GFR calc Af Amer: 13 mL/min — ABNORMAL LOW (ref >60)
GFR calc non Af Amer: 12 mL/min — ABNORMAL LOW (ref >60)
Glucose, Bld: 119 mg/dL — ABNORMAL HIGH (ref 70–99)
Potassium: 4.9 mmol/L (ref 3.5–5.1)
Sodium: 136 mmol/L (ref 135–145)

## 2019-11-14 LAB — HEPATIC FUNCTION PANEL
ALT: 461 U/L — ABNORMAL HIGH (ref 0–44)
AST: 152 U/L — ABNORMAL HIGH (ref 15–41)
Albumin: 2.3 g/dL — ABNORMAL LOW (ref 3.5–5.0)
Alkaline Phosphatase: 220 U/L — ABNORMAL HIGH (ref 38–126)
Bilirubin, Direct: 0.7 mg/dL — ABNORMAL HIGH (ref 0.0–0.2)
Indirect Bilirubin: 0.6 mg/dL (ref 0.3–0.9)
Total Bilirubin: 1.3 mg/dL — ABNORMAL HIGH (ref 0.3–1.2)
Total Protein: 5.6 g/dL — ABNORMAL LOW (ref 6.5–8.1)

## 2019-11-14 LAB — CBC
HCT: 37.1 % — ABNORMAL LOW (ref 39.0–52.0)
Hemoglobin: 11.8 g/dL — ABNORMAL LOW (ref 13.0–17.0)
MCH: 28.8 pg (ref 26.0–34.0)
MCHC: 31.8 g/dL (ref 30.0–36.0)
MCV: 90.5 fL (ref 80.0–100.0)
Platelets: 48 10*3/uL — ABNORMAL LOW (ref 150–400)
RBC: 4.1 MIL/uL — ABNORMAL LOW (ref 4.22–5.81)
RDW: 17.1 % — ABNORMAL HIGH (ref 11.5–15.5)
WBC: 14.7 10*3/uL — ABNORMAL HIGH (ref 4.0–10.5)
nRBC: 0.2 % (ref 0.0–0.2)

## 2019-11-14 LAB — GLUCOSE, CAPILLARY
Glucose-Capillary: 113 mg/dL — ABNORMAL HIGH (ref 70–99)
Glucose-Capillary: 108 mg/dL — ABNORMAL HIGH (ref 70–99)
Glucose-Capillary: 138 mg/dL — ABNORMAL HIGH (ref 70–99)
Glucose-Capillary: 125 mg/dL — ABNORMAL HIGH (ref 70–99)
Glucose-Capillary: 113 mg/dL — ABNORMAL HIGH (ref 70–99)

## 2019-11-14 MED ORDER — BUPRENORPHINE HCL-NALOXONE HCL 8-2 MG SL SUBL
1.0000 | SUBLINGUAL_TABLET | Freq: Every day | SUBLINGUAL | Status: DC
  Administered 2019-11-14 – 2019-11-20 (×7): 1 via SUBLINGUAL
  Filled 2019-11-14 (×7): qty 1

## 2019-11-14 MED ORDER — NEPRO/CARBSTEADY PO LIQD
237.0000 mL | Freq: Two times a day (BID) | ORAL | Status: DC
  Administered 2019-11-15 – 2019-11-16 (×2): 237 mL via ORAL

## 2019-11-14 MED ORDER — PANTOPRAZOLE SODIUM 40 MG PO TBEC
40.0000 mg | DELAYED_RELEASE_TABLET | Freq: Every day | ORAL | Status: DC
  Administered 2019-11-14 – 2019-11-15 (×2): 40 mg via ORAL
  Filled 2019-11-14 (×2): qty 1

## 2019-11-14 NOTE — Progress Notes (Signed)
Nutrition Follow-up  DOCUMENTATION CODES:   Severe malnutrition in context of chronic illness  INTERVENTION:   -Nepro Shake po BID, each supplement provides 425 kcal and 19 grams protein -Magic cup TID with meals, each supplement provides 290 kcal and 9 grams of protein -Renal MVI daily  NUTRITION DIAGNOSIS:   Severe Malnutrition related to chronic illness (ESRD on HD) as evidenced by severe fat depletion, severe muscle depletion.  Ongoing  GOAL:   Patient will meet greater than or equal to 90% of their needs  Progressing   MONITOR:   PO intake, Supplement acceptance, Diet advancement, Labs, Weight trends, Skin, I & O's  REASON FOR ASSESSMENT:   Consult, Ventilator Enteral/tube feeding initiation and management  ASSESSMENT:   Patient with PMH significant for ESRD on HD, Hep C, COPD, HTN, HLD, and seizure disorder. Presents this admission with s/p cardiac arrest.  9/6- failed extubation trial  9/9- extubated 9/10- s/p MBSS- advanced to dysphagia 3 diet with thin liquids  Reviewed I/O's: -1.7 L x 24 hours and +4 L since admission  UOP: 200 ml x 24 hours  Per chart review, pt with some confusion. Attempted to speak with pt via call to hospital room phone, however, no answer.   Pt was just advanced to a dysphagia 3 diet with thin liquids. No meal completion data currently available at this time. Due to malnutrition, pt would greatly benefit from addition of oral nutrition supplements.   Per palliative care notes, plan to re-intubate if needed, but no trach or PEG.   Medications reviewed and include miralax  Labs reviewed: CBGS 102-138 (inpatient orders for glycemic control are none).   Diet Order:   Diet Order            DIET DYS 3 Room service appropriate? Yes with Assist; Fluid consistency: Thin  Diet effective now                 EDUCATION NEEDS:   No education needs have been identified at this time  Skin:  Skin Assessment: Reviewed RN  Assessment  Last BM:  11/14/19  Height:   Ht Readings from Last 1 Encounters:  11/09/19 5\' 9"  (1.753 m)    Weight:   Wt Readings from Last 1 Encounters:  11/13/19 67.1 kg   BMI:  Body mass index is 21.85 kg/m.  Estimated Nutritional Needs:   Kcal:  2000-2200  Protein:  100-115 grams  Fluid:  1000 ml + UOP    Loistine Chance, RD, LDN, [place] Registered Dietitian II Certified Diabetes Care and Education Specialist Please refer to [facility] for RD and/or RD on-call/weekend/after hours pager

## 2019-11-14 NOTE — Progress Notes (Signed)
Modified Barium Swallow Progress Note  Patient Details  Name: Ralph Becker MRN: [id number] Date of Birth: [birth year]  Today's Date: 11/14/2019  Modified Barium Swallow completed.  Full report located under Chart Review in the Imaging Section.  Brief recommendations include the following:  Clinical Impression  Pt was seen in radiology suite for modified barium swallow study. Trials of puree solids, regular texture solids, mixed consistency boluses (mechanical soft with thin liquids), a 71mm barium tablet, and thin liquids via cup and straw were administered. Pt's oropharyngeal swallow mechanism was within functional limits. Coughing was intermittently noted during the study; however, no instances of penetration or aspiration were demonstrated and coughing therefore appears to be unrelated to swallowing. It is recommended that a dysphagia 3 diet with thin liquids be initiated at this time. SLP will see patient once more to ensure tolerance of the diet; however, further skilled SLP services will likely not be needed beyond that.    Swallow Evaluation Recommendations       SLP Diet Recommendations: Dysphagia 3 (Mech soft) solids;Thin liquid   Liquid Administration via: Cup;Straw   Medication Administration: Whole meds with liquid   Supervision: Patient able to self feed   Compensations: Slow rate;Small sips/bites   Postural Changes: Remain semi-upright after after feeds/meals (Comment)   Oral Care Recommendations: Oral care BID       Salvator Seppala I. Hardin Negus, [place], [place] Office number [phone] Pager [phone]  Horton Marshall 11/14/2019,11:03 AM

## 2019-11-14 NOTE — Evaluation (Signed)
Physical Therapy Evaluation Patient Details Name: Ralph Becker MRN: [id number] DOB: 1960/08/26 Today's Date: 11/14/2019   History of Present Illness  59 year old man hx of ESRD on dialysis t/th/s, Hep C, COPD, HTN, HLD, seizure hx (?),  s/p cardiac arrest. Found down and unresponsive at home, last seen awake 5 min prior.  Found to be in asystolic cardiac arrest.  Received 4 min CPR, developed Vfib/vtach, defibrillated twice.  Total 10-12 minute resuscitation.  Intubated by EMS. Pt extubated on 11/10/2019 but required reintubation on same day. Pt again extubated on 11/13/2019.  Clinical Impression  Pt presents to PT with deficits in functional mobility, gait, balance, strength, power, endurance, and cognition. Pt demonstrates limited activity tolerance during session and is unsteady at times, requiring BUE support of cane and furniture to steady. Pt demonstrates impaired cognition during session, with poor orientation to time and slowed processing. Pt will benefit from continued acute PT POC to improve mobility tolerance and to reduce falls risk. PT anticipates the pt will progress well with aggressive mobilization and be able to discharge home with HHPT and assistance from his significant other for all OOB mobility.    Follow Up Recommendations Home health PT;Supervision for mobility/OOB    Equipment Recommendations   (TBD, may benefit from RW after further assessment)    Recommendations for Other Services       Precautions / Restrictions Precautions Precautions: Fall Precaution Comments: monitor SpO2 Restrictions Weight Bearing Restrictions: No      Mobility  Bed Mobility Overal bed mobility: Modified Independent                Transfers Overall transfer level: Needs assistance Equipment used: None Transfers: Sit to/from Stand;Stand Pivot Transfers Sit to Stand: Min guard Stand pivot transfers: Min guard       General transfer comment: definite use of hands to push up from  bed  Ambulation/Gait Ambulation/Gait assistance: Min guard Gait Distance (Feet): 12 Feet Assistive device: Straight cane Gait Pattern/deviations: Step-to pattern;Drifts right/left Gait velocity: reduced Gait velocity interpretation: <1.31 ft/sec, indicative of household ambulator General Gait Details: use of BUE support of bedrail or side table when able, increased sway, slowed gait speed  Stairs            Wheelchair Mobility    Modified Rankin (Stroke Patients Only)       Balance Overall balance assessment: Needs assistance Sitting-balance support: No upper extremity supported;Feet supported Sitting balance-Leahy Scale: Good     Standing balance support: Single extremity supported Standing balance-Leahy Scale: Poor Standing balance comment: reliant on UE support for stability                             Pertinent Vitals/Pain Pain Assessment: Faces Faces Pain Scale: Hurts a little bit Pain Location: back Pain Descriptors / Indicators: Grimacing Pain Intervention(s): Monitored during session    Home Living Family/patient expects to be discharged to:: Private residence Living Arrangements: Spouse/significant other (girlfriend) Available Help at Discharge: Friend(s);Available 24 hours/day Type of Home: Other(Comment) (double wide trailor) Home Access: Stairs to enter Entrance Stairs-Rails: Can reach both Entrance Stairs-Number of Steps: 3 Home Layout: One level Home Equipment:  (quad cane)      Prior Function Level of Independence: Independent         Comments: pt reports ambulating with quad cane, activity tolerance depends on his chronic back pain     Hand Dominance  Extremity/Trunk Assessment   Upper Extremity Assessment Upper Extremity Assessment: Generalized weakness    Lower Extremity Assessment Lower Extremity Assessment: Generalized weakness    Cervical / Trunk Assessment Cervical / Trunk Assessment: Normal   Communication   Communication: No difficulties  Cognition Arousal/Alertness: Awake/alert Behavior During Therapy: WFL for tasks assessed/performed Overall Cognitive Status: Impaired/Different from baseline Area of Impairment: Orientation;Memory;Safety/judgement;Awareness;Problem solving                 Orientation Level: Disoriented to;Time (oriented to year but not month or day)   Memory: Decreased short-term memory   Safety/Judgement: Decreased awareness of safety;Decreased awareness of deficits Awareness: Intellectual Problem Solving: Slow processing        General Comments General comments (skin integrity, edema, etc.): pt on 5L [place] upon PT arrival found to be saturating at 86%, PT increases supplemental oxygen to 6L for mobility, with which pt sats in low 90s for short distance of ambulation. During brief period of switching oxygen from portable tank to wall pt desats to mid 70s while on room air, taking 2 minutes to recover to 90% on 5L. Pt also reports some dizziness and lightheadedness with standing at edge of bed, resolves with ambulation    Exercises     Assessment/Plan    PT Assessment Patient needs continued PT services  PT Problem List Decreased strength;Decreased activity tolerance;Decreased balance;Decreased mobility;Decreased knowledge of use of DME;Decreased knowledge of precautions;Cardiopulmonary status limiting activity       PT Treatment Interventions DME instruction;Gait training;Stair training;Functional mobility training;Therapeutic activities;Therapeutic exercise;Balance training;Patient/family education;Cognitive remediation    PT Goals (Current goals can be found in the Care Plan section)  Acute Rehab PT Goals Patient Stated Goal: To improve mobility tolerance PT Goal Formulation: With patient Time For Goal Achievement: 11/28/19 Potential to Achieve Goals: Fair    Frequency Min 3X/week   Barriers to discharge        Co-evaluation                AM-PAC PT "6 Clicks" Mobility  Outcome Measure Help needed turning from your back to your side while in a flat bed without using bedrails?: None Help needed moving from lying on your back to sitting on the side of a flat bed without using bedrails?: None Help needed moving to and from a bed to a chair (including a wheelchair)?: A Little Help needed standing up from a chair using your arms (e.g., wheelchair or bedside chair)?: A Little Help needed to walk in hospital room?: A Little Help needed climbing 3-5 steps with a railing? : A Lot 6 Click Score: 19    End of Session Equipment Utilized During Treatment: Oxygen Activity Tolerance: Patient tolerated treatment well Patient left: in chair;with call bell/phone within reach;with chair alarm set;with nursing/sitter in room Nurse Communication: Mobility status PT Visit Diagnosis: Unsteadiness on feet (R26.81);Muscle weakness (generalized) (M62.81)    Time: 4332-9518 PT Time Calculation (min) (ACUTE ONLY): 16 min   Charges:   PT Evaluation $PT Eval Moderate Complexity: 1 Mod          Zenaida Niece, PT, DPT Acute Rehabilitation Pager: [phone]   Zenaida Niece 11/14/2019, 5:46 PM

## 2019-11-14 NOTE — Progress Notes (Addendum)
NAME:  Ralph Becker, MRN:  [id number], DOB:  Jul 22, 1960, LOS: 4 ADMISSION DATE:  11/09/2019, CONSULTATION DATE:  11/10/19 REFERRING MD:  Christy Gentles, CHIEF COMPLAINT:  Cardiac arrest  Brief History    Ralph Becker is a 59 year old man hx of ESRD on dialysis t/th/s, Hep C, COPD, HTN, HLD, seizure hx (?),  s/p cardiac arrest.   History of present illness    Found down and unresponsive at home, last seen awake 5 min prior.  Found to be in asystolic cardiac arrest.  Received 4 min CPR, developed Vfib/vtach, defibrillated twice.  Total 10-12 minute resuscitation.  Intubated by EMS.   In the ED given keppra, calcium, insulin, D50, started on fentanyl and propofol.    Past Medical History   Past Medical History:  Diagnosis Date  . Chronic hepatitis C ([place])   . COPD (chronic obstructive pulmonary disease) ([place]) 09/08/2019  . ESRD (end stage renal disease) ([place])    HD Tues, Thurs, Sat  . Essential hypertension   . Mixed hyperlipidemia   . Nicotine dependence, cigarettes, uncomplicated   . Seizure disorder ([place]) 09/08/2019     Significant Hospital Events   9/6 admitted, extubation trial, failed due to secretion burden and poor cough  Consults:  Nephrology   Procedures:  9/5 ETT >> 9/6 , reintubated 9/6 >> 9/9 9/6 LT [place] CVL >>  Significant Diagnostic Tests:   CTA Chest- no PE, lBL patchy infx   Micro Data:  Blood culture 9/6 >> ng resp 9/7 >> H flu    Antimicrobials:    Interim history/subjective:      Objective   Blood pressure (!) 165/98, pulse 69, temperature 98.1 F (36.7 C), temperature source Oral, resp. rate 16, height 5\' 9"  (1.753 m), weight 67.1 kg, SpO2 100 %.    FiO2 (%):  [40 %] 40 %   Intake/Output Summary (Last 24 hours) at 11/14/2019 1126 Last data filed at 11/14/2019 0900 Gross per 24 hour  Intake 30 ml  Output --  Net 30 ml   Filed Weights   11/13/19 0500 11/13/19 0656 11/13/19 1009  Weight: 65.1 kg 68.3 kg 67.1 kg    Examination: Gen:      Appears  chronically ill, older than stated age no distress , on 10 L nasal cannula HEENT:  EOMI, sclera anicteric, mild pallor Neck:     No JVD; no thyromegaly Lungs:     wet sounding cough, no rhonchi, crackles right base CV:         Regular rate and rhythm; no murmurs Abd:      + bowel sounds; soft, non-tender; no palpable masses, no distension Ext:    No edema; adequate peripheral perfusion Skin:      Warm and dry; no rash Neuro: alert , oriented to name and person, not to place, nonfocal   Labs show normal electrolytes, decreasing leukocytosis, stable anemia , severe thrombocytopenia  Chest x-ray 9/9 personally reviewed shows diffuse bilateral interstitial infiltrates without change from prior  Resolved Hospital Problem list     Assessment & Plan:  S/p Cardiac arrest: PEA, then vtach/vfib.  THC and benzos in urine.  Reportedly missed HD.  Initial K 7.4. -No ischemia work-up required  Acute respiratory failure -extubated, doing better with secretions this time, I was able to decrease FiO2 to 4 L nasal cannula Aspiration pneumonitis vs. H influenza pneumonia- ceftriaxone x 7 days  Shock, RV failure, tachycardia, ?septic- improved, totality of evidence suggests mostly septic shock. - dc  midodrine -DC steroids  Atrial fibrillation -  cardioverted with amiodarone and became bradycardic, he is now on no antiarrhythmics - Telemetry, continue PTA metoprolol  ESRD on HD-  Continue iHD  HepC, steatohepatitis, shock liver, DIC-resolving, supportive care, restart heparin ppx for now  Severe protein calorie malnutrition, FTT,  6 hospital admissions this year starting to 08/14/2010 withdrawal 14 can start  Goals of care -per last discussion on 9/9 -reintubated if needed, no CPR no cardioversion  Best practice:  Diet: TF  Pain/Anxiety/Delirium protocol (if indicated): wean VAP protocol (if indicated): yes DVT prophylaxis: heparin GI prophylaxis: protonix Glucose control: ISS  Mobility:  Bed  Code Status: Full  Family Communication:  Brother Roland Disposition: ICU --> SDU   11/14/2019 11:26 AM

## 2019-11-14 NOTE — Evaluation (Signed)
Clinical/Bedside Swallow Evaluation Patient Details  Name: Ralph Becker MRN: [id number] Date of Birth: [birth year]  Today's Date: 11/14/2019 Time: SLP Start Time (ACUTE ONLY): 0902 SLP Stop Time (ACUTE ONLY): 0920 SLP Time Calculation (min) (ACUTE ONLY): 18 min  Past Medical History:  Past Medical History:  Diagnosis Date  . Chronic hepatitis C ([place])   . COPD (chronic obstructive pulmonary disease) ([place]) 09/08/2019  . ESRD (end stage renal disease) ([place])    HD Tues, Thurs, Sat  . Essential hypertension   . Mixed hyperlipidemia   . Nicotine dependence, cigarettes, uncomplicated   . Seizure disorder ([place]) 09/08/2019   Past Surgical History:  Past Surgical History:  Procedure Laterality Date  . AV FISTULA PLACEMENT Left 09/15/2019   Procedure: ARTERIOVENOUS (AV) FISTULA CREATION LEFT ARM;  Surgeon: [name], MD;  Location: [facility] OR;  Service: Vascular;  Laterality: Left;   HPI:  Pt is a 59 year old man with hx of ESRD on dialysis, Hep C, COPD, HTN, HLD, seizure hx (?). He was found down and unresponsive at home, and found to be in asystolic cardiac arrest, received 4 min CPR, developed Vfib/vtach, defibrillated twice with total 10-12 minute resuscitation. He was intubated by EMS on 9/6 and extubated 9/9. CXR 9/9: Diffuse bilateral interstitial infiltrates. Palliative care has been consulted and the family would like pt to be reintubated if needed as of 9/9.    Assessment / Plan / Recommendation Clinical Impression  Pt was seen for bedside swallow evaluation and he denied a history of dysphagia. However, he exhibited confusion and his reliability as a historian is therefore questioned. Oral mechanism exam was limited due to pt's difficulty following commands; however, oral motor strength and ROM appeared grossly [facility] and pt was edentulous. He intermittently demonstrated a weak cough at baseline. Coughing was also noted with thin liquids and nectar thick liquids. Aspiration is therefore suspected,  but the possibility of it being unrelated to swallowing is also considered. Pt tolerated solids without significant symptoms of oropharyngeal dysphagia and mastication was [facility] despite edentulous status. It is recommended that the pt's NPO status be maintained and that a modified barium swallow study be conducted (scheduled for 10:30) prior to initiating a p.o. diet.    SLP Visit Diagnosis: Dysphagia, pharyngeal phase (R13.13)    Aspiration Risk  Mild aspiration risk    Diet Recommendation NPO   Medication Administration: Crushed with puree    Other  Recommendations Oral Care Recommendations: Oral care QID   Follow up Recommendations Other (comment) (TBD)      Frequency and Duration min 2x/week  2 weeks       Prognosis Prognosis for Safe Diet Advancement: Good Barriers to Reach Goals: Cognitive deficits      Swallow Study   General Date of Onset: 11/13/19 HPI: Pt is a 59 year old man with hx of ESRD on dialysis, Hep C, COPD, HTN, HLD, seizure hx (?). He was found down and unresponsive at home, and found to be in asystolic cardiac arrest, received 4 min CPR, developed Vfib/vtach, defibrillated twice with total 10-12 minute resuscitation. He was intubated by EMS on 9/6 and extubated 9/9. CXR 9/9: Diffuse bilateral interstitial infiltrates. Palliative care has been consulted and the family would like pt to be reintubated if needed as of 9/9.  Type of Study: Bedside Swallow Evaluation Previous Swallow Assessment: None Diet Prior to this Study: NPO Temperature Spikes Noted: No Respiratory Status: Nasal cannula History of Recent Intubation: Yes Length of Intubations (days): 3  days Date extubated: 11/13/19 Behavior/Cognition: Alert;Cooperative;Pleasant mood;Confused Oral Cavity Assessment: Within Functional Limits Oral Care Completed by SLP: Recent completion by staff Oral Cavity - Dentition: Edentulous Vision: Functional for self-feeding Self-Feeding Abilities: Able to feed  self Patient Positioning: Upright in bed;Postural control adequate for testing Baseline Vocal Quality: Normal Volitional Cough: Weak Volitional Swallow: Able to elicit    Oral/Motor/Sensory Function Overall Oral Motor/Sensory Function: Within functional limits   Ice Chips Ice chips: Within functional limits Presentation: Spoon   Thin Liquid Thin Liquid: Impaired Presentation: Cup;Straw Pharyngeal  Phase Impairments: Throat Clearing - Immediate    Nectar Thick Nectar Thick Liquid: Impaired Presentation: Straw;Cup Pharyngeal Phase Impairments: Cough - Immediate   Honey Thick Honey Thick Liquid: Not tested   Puree Puree: Within functional limits Presentation: Spoon   Solid     Solid: Within functional limits Presentation: [place] I. Hardin Negus, [place], [place] Office number [phone] Pager [phone]  Ralph Becker 11/14/2019,9:27 AM

## 2019-11-14 NOTE — Progress Notes (Signed)
[facility] Progress Note  Subjective: pt awake and somewhat confused. Extubated yesterday.   Vitals:   11/14/19 0319 11/14/19 0749 11/14/19 0800 11/14/19 0900  BP:   (!) 161/96 (!) 165/98  Pulse:   73 69  Resp:   19 16  Temp: 97.7 F (36.5 C) 98.1 F (36.7 C)    TempSrc: Axillary Oral    SpO2:   98% 100%  Weight:      Height:        Exam: Gen on vent, awake, chron ill appearing, frail No jvd or bruits Chest bilat insp rales at the bases RRR no MRG Abd soft ntnd no mass or ascites +bs Ext no leg or UE edema Neuro is awake and confused R IJ TDC exit c/d     CXR - IMPRESSION: 1. Endotracheal tube tip is difficult to visualize but likely at the level of the clavicular heads. 2. Enteric tube in place with tip and side-port below the diaphragm in the stomach. 3. Increasing peribronchial and interstitial thickening, may be pulmonary edema or atypical infection.   Home meds:  - lipitor 40/ hydralazine 50 bid/ metoprolol 25 bid/ sod bicarb bid  - suboxone 8-2 mg film 1 qd/ klonopin 1 mg hs/ neurontin 300 qd  - flomax/  prn's/ vitamins/ supplements    CXR 9/6 - IMPRESSION: 1. Left subclavian central venous catheter tip projects over the mid SVC.... 4. Unchanged peribronchial and interstitial thickening.     CTA chest 9/7 yest - IMPRESSION: 1. No evidence for pulmonary embolus. 2. Patchy airspace opacities at the lung apices bilaterally, right greater than left. Findings are concerning for pneumonia. 3. Small right pleural effusion. 4. Endotracheal tube and NG tube are in satisfactory position. 5. Aortic Atherosclerosis (ICD10-I70.0).    OP HD: TTS [place]  4h  2/2.25 bath  TDC  60.5kg   Hep 4000  - calc 0.25 ug po tiw  Assessment/ Plan: 1. Cardiac arrest - PEA, then vtach/ vib. No further issues.  2. Resp arrest/ prob PNA - on vent, poss asp PNA, on abx. Extubated yest 9/09.   3. Shock/ tachycardia-  getting midodrine, off pressors. BP's  better 4. ESRD - usual HD TTS. HD Sat. 5. Hyperkalemia - severe, resolved w/ HD.  6. Shock liver/ DIC/ low plts - getting heparin ppx per ccm 7. Vol - up 5-6kg, 1.5 off on HD yest, last CXR not sig wet. 8. COPD 9. H/o opioid abuse - on suboxone 10. H/o seizure d/o  11. Partial DNR - no CPR/ shock/ meds 12. FTT - pt has been declining per HD staff for the last several mos, very frail and serious drug addiction issues     [name] 11/14/2019, 10:31 AM   Recent Labs  Lab 11/12/19 0428 11/12/19 0428 11/12/19 2307 11/13/19 0359 11/14/19 0531  K 4.8   < >  --  5.1 4.9  BUN 28*   < >  --  55* 51*  CREATININE 4.61*   < >  --  5.84* 4.99*  CALCIUM 7.7*   < >  --  7.6* 8.3*  PHOS 6.1*  --  5.4*  --   --   HGB 10.5*   < >  --  10.5* 11.8*   < > = values in this interval not displayed.   Inpatient medications: . aspirin  324 mg Oral Once  . atorvastatin  40 mg Oral Daily  . Chlorhexidine Gluconate Cloth  6 each Topical Q0600  .  docusate sodium  100 mg Oral BID  . heparin injection (subcutaneous)  5,000 Units Subcutaneous Q8H  . insulin aspart  0-15 Units Subcutaneous Q4H  . mouth rinse  15 mL Mouth Rinse BID  . metoprolol tartrate  25 mg Oral BID  . midodrine  10 mg Oral TID WC  . multivitamin  1 tablet Oral QHS  . pantoprazole (PROTONIX) IV  40 mg Intravenous QHS  . polyethylene glycol  17 g Oral Daily  . sodium chloride flush  10-40 mL Intracatheter Q12H   . sodium chloride 10 mL/hr at 11/14/19 0900  . cefTRIAXone (ROCEPHIN)  IV 2 g (11/14/19 0944)  . feeding supplement (VITAL AF 1.2 CAL) 60 mL/hr at 11/13/19 0400  . norepinephrine (LEVOPHED) Adult infusion Stopped (11/11/19 2118)  . vasopressin 0.01 Units/min (11/13/19 0652)   sodium chloride, acetaminophen, heparin sodium (porcine), ipratropium-albuterol, metoprolol tartrate, sodium chloride flush

## 2019-11-15 ENCOUNTER — Inpatient Hospital Stay (HOSPITAL_COMMUNITY): Payer: Medicaid Other

## 2019-11-15 LAB — BLOOD GAS, ARTERIAL
Acid-Base Excess: 2.4 mmol/L — ABNORMAL HIGH (ref 0.0–2.0)
Bicarbonate: 28.1 mmol/L — ABNORMAL HIGH (ref 20.0–28.0)
FIO2: 44
O2 Saturation: 84.2 %
Patient temperature: 36.7
pCO2 arterial: 57.1 mmHg — ABNORMAL HIGH (ref 32.0–48.0)
pH, Arterial: 7.312 — ABNORMAL LOW (ref 7.350–7.450)
pO2, Arterial: 52.5 mmHg — ABNORMAL LOW (ref 83.0–108.0)

## 2019-11-15 LAB — RENAL FUNCTION PANEL
Albumin: 2.4 g/dL — ABNORMAL LOW (ref 3.5–5.0)
Anion gap: 12 (ref 5–15)
BUN: 34 mg/dL — ABNORMAL HIGH (ref 8–23)
CO2: 25 mmol/L (ref 22–32)
Calcium: 8.1 mg/dL — ABNORMAL LOW (ref 8.9–10.3)
Chloride: 98 mmol/L (ref 98–111)
Creatinine, Ser: 3.94 mg/dL — ABNORMAL HIGH (ref 0.61–1.24)
GFR calc Af Amer: 18 mL/min — ABNORMAL LOW (ref >60)
GFR calc non Af Amer: 15 mL/min — ABNORMAL LOW (ref >60)
Glucose, Bld: 152 mg/dL — ABNORMAL HIGH (ref 70–99)
Phosphorus: 3.8 mg/dL (ref 2.5–4.6)
Potassium: 4 mmol/L (ref 3.5–5.1)
Sodium: 135 mmol/L (ref 135–145)

## 2019-11-15 LAB — CULTURE, BLOOD (ROUTINE X 2)
Culture: NO GROWTH
Culture: NO GROWTH

## 2019-11-15 LAB — BASIC METABOLIC PANEL
Anion gap: 17 — ABNORMAL HIGH (ref 5–15)
BUN: 74 mg/dL — ABNORMAL HIGH (ref 8–23)
CO2: 19 mmol/L — ABNORMAL LOW (ref 22–32)
Calcium: 8.2 mg/dL — ABNORMAL LOW (ref 8.9–10.3)
Chloride: 98 mmol/L (ref 98–111)
Creatinine, Ser: 6.46 mg/dL — ABNORMAL HIGH (ref 0.61–1.24)
GFR calc Af Amer: 10 mL/min — ABNORMAL LOW (ref >60)
GFR calc non Af Amer: 8 mL/min — ABNORMAL LOW (ref >60)
Glucose, Bld: 129 mg/dL — ABNORMAL HIGH (ref 70–99)
Potassium: 4.9 mmol/L (ref 3.5–5.1)
Sodium: 134 mmol/L — ABNORMAL LOW (ref 135–145)

## 2019-11-15 LAB — CBC
HCT: 38.9 % — ABNORMAL LOW (ref 39.0–52.0)
Hemoglobin: 12 g/dL — ABNORMAL LOW (ref 13.0–17.0)
MCH: 28.4 pg (ref 26.0–34.0)
MCHC: 30.8 g/dL (ref 30.0–36.0)
MCV: 92.2 fL (ref 80.0–100.0)
Platelets: 49 10*3/uL — ABNORMAL LOW (ref 150–400)
RBC: 4.22 MIL/uL (ref 4.22–5.81)
RDW: 17.4 % — ABNORMAL HIGH (ref 11.5–15.5)
WBC: 21.2 10*3/uL — ABNORMAL HIGH (ref 4.0–10.5)
nRBC: 0.4 % — ABNORMAL HIGH (ref 0.0–0.2)

## 2019-11-15 LAB — HEPATIC FUNCTION PANEL
ALT: 327 U/L — ABNORMAL HIGH (ref 0–44)
AST: 79 U/L — ABNORMAL HIGH (ref 15–41)
Albumin: 2.4 g/dL — ABNORMAL LOW (ref 3.5–5.0)
Alkaline Phosphatase: 228 U/L — ABNORMAL HIGH (ref 38–126)
Bilirubin, Direct: 0.5 mg/dL — ABNORMAL HIGH (ref 0.0–0.2)
Indirect Bilirubin: 0.5 mg/dL (ref 0.3–0.9)
Total Bilirubin: 1 mg/dL (ref 0.3–1.2)
Total Protein: 6 g/dL — ABNORMAL LOW (ref 6.5–8.1)

## 2019-11-15 LAB — GLUCOSE, CAPILLARY
Glucose-Capillary: 94 mg/dL (ref 70–99)
Glucose-Capillary: 79 mg/dL (ref 70–99)
Glucose-Capillary: 111 mg/dL — ABNORMAL HIGH (ref 70–99)
Glucose-Capillary: 82 mg/dL (ref 70–99)
Glucose-Capillary: 113 mg/dL — ABNORMAL HIGH (ref 70–99)

## 2019-11-15 LAB — MAGNESIUM: Magnesium: 1.9 mg/dL (ref 1.7–2.4)

## 2019-11-15 LAB — CULTURE, BLOOD (SINGLE)
Culture: NO GROWTH
Special Requests: ADEQUATE

## 2019-11-15 MED ORDER — HEPARIN SODIUM (PORCINE) 1000 UNIT/ML IJ SOLN
INTRAMUSCULAR | Status: AC
  Filled 2019-11-15: qty 4

## 2019-11-15 MED ORDER — LIDOCAINE 5 % EX PTCH
2.0000 | MEDICATED_PATCH | CUTANEOUS | Status: DC
  Administered 2019-11-15 – 2019-11-20 (×5): 2 via TRANSDERMAL
  Filled 2019-11-15 (×5): qty 2

## 2019-11-15 NOTE — Significant Event (Addendum)
HOSPITAL MEDICINE OVERNIGHT EVENT NOTE    Notified by nursing that patient is becoming progressively more hypoxic with increasing oxygen requirements.  Patient has told nursing that he "does not feel well" but is unable to specify any specific symptoms.  Chart reviewed, patient is status post cardiac arrest with associated severe hyperkalemia earlier in the hospitalization with hospital course complicated by respiratory failure secondary to aspiration pneumonia status post extubation.  We will obtain chest x-ray, EKG, ABG, CMP and magnesium.  Adjust supplemental oxygen to target oxygen saturations 92 to 96%.  Vernelle Emerald  MD [facility]   ADDENDUM   Evaluated patient at the bedside.  Patient is visibly confused and only oriented x 1. Patient able to follow commands and moving all extremities spontaneously.   Lung sounds are extremely coarse.  No evidence of significant JVD or peripheral edema.    ABG suggestive of hypercapnic and hypoxic respiratory failure with a pH of 7.31 PaCO2 of 57.1 and PaO2 of 52.5 on 6LPM via [place].   CBC this morning shows substantial rise in WBC to 21.2.   CXR shows persisting right sided infiltrates with worsening left sided infiltrates.    Review of recent echo reveals a preserved EF with no diastolic dysfunction despite RV dysfunction.   Patient is exhibiting encephalopathy occurring at least in part due to hypercapnia possibly with contributing worsening pneumonia.  I don't believe this is due to pulmonary edema - patient just had HD and is not volume overloaded otherwise.    Will get blood cultures, repeat procalcitonin.   Will add IV Metronidazole to IV Ceftriaxone for anaerobic coverage.  Will additionally add Vancomycin as well for now.  Day provider can de escalate antibiotics as deemed appropriate.    Initiating BIPAP therapy, will get repeat ABG in several hours after initiation.  Will monitor closely.    Sherryll Burger Sayan Aldava   ADDENDUM (9/12  4am)  Nursing has notified me that patient continues to be agitated despite initiation of BIPAP.  Patient forcibly removed his tunnled HD catheter as a result.  Hemostasis has been quickly achieved.  The catheter is intact per nursing.  Will obtain stat chest x-ray to ensure there is no retained portion of the catheter.  Will provide patient with 2.5mg  IV Haldol to try to prevent patient from participating in further self harm.  Will obtain CT head.    Continue BiPAP and monitor.   Vernelle Emerald  MD [facility]

## 2019-11-15 NOTE — Progress Notes (Addendum)
Palliative Medicine Inpatient Follow Up Note  Reason for Consultation: Establishing goals of care  HPI/Patient Profile: 59 y.o. male  with past medical history of ESRD on dialysis TTS, hepatitis C, COPD, HTN, HLD, seizures, hepatic steatosis, degenerative disc disease, nicotine dependence, severe malnutrition with failure to thrive. Recent admission in June [facility] with COPD exacerbation requiring intubation and dialysis initiated. Admitted on 11/09/2019 with cardiac arrest at home and total time resuscitated 10-12 minutes with PEA and then VT/VF. Complicated by aspiration pneumonitis vs pneumonia, RV failure, shock liver.    Today's Discussion (11/15/2019): Chart reviewed. I met with Ralph Becker at bedside. I introduced myself as a Social worker of [facility] on the [facility]. I shared with him that we are here to continue offering support and to get a better impression of what is important to him. Ralph Becker shares that he has a girlfriend who he has been with for the past four years. He use to live in [place] though moved here about twenty years ago as his parents retired to [employer] area and he lived with them. He states that he was married though that did not work out. He has four children from different partners though he is not in contact with any of them.   We discussed his present health state and how it overall is worrisome. I shared with him the concern that if something catastrophic occurred he would likely in the long run not get great benefit out of additional CPR and intubation as these may ultimately result in him living a life on artificial support which he states he would not want. We completed a MOST form today. The patient outlined his wishes for the following treatment decisions:  Cardiopulmonary Resuscitation: Do Not Attempt Resuscitation (DNR/No CPR)  Medical Interventions: Limited Additional Interventions: Use medical treatment, IV fluids and cardiac monitoring as indicated, DO  NOT USE intubation or mechanical ventilation. May consider use of less invasive airway support such as BiPAP or CPAP. Also provide comfort measures. Transfer to the hospital if indicated. Avoid intensive care.   Antibiotics: Determine use of limitation of antibiotics when infection occurs  IV Fluids: IV fluids for a defined trial period  Feeding Tube: No feeding tube   [name] does consider himself faithful and practices within the [facility] denomination. He would like to meet with the chaplain as it was offered.   I was able to speak with patients brother Ralph Becker and review the above MOST form. He shares that he has not seen his brother in a decade. He had made various attempts to reach out to [place] though these were unsuccessful. He states that his present living situation does not sound good - he is concerned that Ralph Becker is living in a "drug house". He states that he knows upon discharge he will likely go back to his "old ways" possibly resulting in a poor outcome. I shared that our SW Becker can support in getting [name] information on rehabilitations and polysubstance abuse --> On suboxone for heroin addiction. Had abused klonopin in the past.  He has and is likely still abusing ETOH.   Ralph Becker and I discussed Samer's malnourished state in the setting of substance abuse. I will request for dietary to aid in any additional recommendations to support Ralph Becker nutritionally.   Discussed the importance of continued conversation with family and their  medical providers regarding overall plan of care and treatment options, ensuring decisions are within the context of the patients values and GOCs.  Questions  and concerns addressed   SUMMARY OF RECOMMENDATIONS   DNAR/DNI  MOST completed  TOC - OP Palliative Support  Malnutrition - Dietary consult  Polysubstance abuse - SW consult  Spiritual Support - Ord  Time Spent: 45 Greater than 50% of the time was spent in counseling and  coordination of care ______________________________________________________________________________________ Ralph Becker Becker Cell Phone: [phone] Please utilize secure chat with additional questions, if there is no response within 30 minutes please call the above phone number  Palliative Medicine [facility] providers are available by phone from 7am to 7pm daily and can be reached through the Becker cell phone.  Should this patient require assistance outside of these hours, please call the patient's attending physician.

## 2019-11-15 NOTE — Progress Notes (Signed)
Dr. Dwyane Dee notified of conversion to Afib at noon today, during HD, as I was just notified by central tele. The pt is now in SR as per tele. Dr. Dwyane Dee stated he will discuss with cardiology tomorrow.

## 2019-11-15 NOTE — Progress Notes (Addendum)
Patient converted to afib. MD paged. Received verbal order to obtain 12 lead EKG. EKG taken and present in chart.

## 2019-11-15 NOTE — Progress Notes (Signed)
Patient reported non-specific ill feeling. Refused assessment. Increased oxygen from 5L to 7L, O2 sat improved to low 90s. MD notified.

## 2019-11-15 NOTE — Progress Notes (Signed)
PROGRESS NOTE    Ralph Becker  YKZ:[id number] DOB: 25-Nov-1960 DOA: 11/09/2019 PCP: Patient, No Pcp Per   Brief Narrative: 59 years old male with history of end-stage renal disease on hemo dialysisT/T/S, hepatitis C, COPD, hypertension, hyperlipidemia, seizure disorder who was found unresponsive at home, last known normal was 4 mins ago. Patient was found to be in asystole with cardiac arrest.  Patient received 4 minutes of CPR,  developed V. Fib / V. Tach.  He was defibrillated twice,  total duration of resuscitation 10 to 12 minutes.  He was intubated by EMS , Patient was admitted in the ICU and was given Keppra, calcium, insulin, D5W in the ED.  CTA Chest:  no PE, there was some  patchy infiltrates,  started on antibiotic for aspiration pneumonia. Patient was continued on hemodialysis.  Palliative care was consulted. Patient is DNR.  He was successfully extubated 9/9, PCCM pickup 911.  Patient was admitted status post cardiac arrest with PEA then V. Tach/ V. fib could be secondary to missing hemodialysis found to have a potassium of 7.4. continued on hemodialysis,  Success fully extubated 9/9 ,  Also found to have aspiration pneumonia on antibiotics,  became hypotensive,  was started briefly on midodrine,  blood pressure has improved. Metoprolol resumed.  He is on no antiarrhythmic medications. PT : Home PT. Speech/ swallow : Dysphagia II diet. Palliative care consulted,: No trach, No PEG tube, DNR   Assessment & Plan:   Active Problems:   Cardiac arrest ([place])   Failure to thrive in adult   Goals of care, counseling/discussion   DNR (do not resuscitate)   Palliative care by specialist   S/p Cardiac arrest: PEA: Patient was found unresponsive  And was in PEA with cardiac arrest then vtach/vfib.   Patient received 4 minutes of CPR,  developed V. Fib / V. Tach.   He was defibrillated twice,  total duration of resuscitation 10 to 12 minutes. THC and benzos in urine.  Reportedly missed HD.   Initial K 7.4. Could be hyperkalemia, missed HD . Palliative care consulted, Patient is now DNR.  Acute respiratory failure requiring mechanical ventilation He was initially intubated on arrival, he remained in ICU. Continued on his hemodialysis, successfully extubated 9/9. Found to have infiltrate on CT chest consistent with aspiration pneumonia. Continue aspiration precautions,  started on ceftriaxone for 7 days. Patient is on room air 4 L saturating 94%.  Septic shock  : Resolved. Patient became hypotensive requiring midodrine and steroids. which were discontinued. - dc midodrine -DC steroids  Atrial fibrillation : - Cardioverted with amiodarone but became bradycardic, he is now on no antiarrhythmics - Continue telemetry, metoprolol started.  Heart rate controlled.  ESRD on HD-  Continue HD on Tuesday Thursdays and Saturdays  Hepatitis C: continue supportive care.   Severe protein calorie malnutrition: FTT, Patient had 6 hospital admissions this year Goals of care -per last discussion on 9/9 -reintubated if needed, no CPR no cardioversion. Goals of care meeting occurred today patient has agreed for DNR.    DVT prophylaxis: Heparin Code Status: DNR Family Communication: No one at bed side. Disposition Plan: Status is: Inpatient  Remains inpatient appropriate because:Hemodynamically unstable   Dispo: The patient is from: Home              Anticipated d/c is to: SNF              Anticipated d/c date is: 2 days  Patient currently is not medically stable to d/c.   Consultants:   PCCM  Procedures:  Antimicrobials: Anti-infectives (From admission, onward)   Start     Dose/Rate Route Frequency Ordered Stop   11/11/19 1000  cefTRIAXone (ROCEPHIN) 2 g in sodium chloride 0.9 % 100 mL IVPB        2 g 200 mL/hr over 30 Minutes Intravenous Every 24 hours 11/11/19 0914 11/18/19 0959      Subjective: Patient was seen and examined at bedside. Overnight  events noted.  He feels better but very slow in response,  denies any chest pain and shortness of breath.  He is on 4 L supplemental oxygen saturating 92%. He is going to have hemodialysis today.  Objective: Vitals:   11/15/19 1430 11/15/19 1500 11/15/19 1530 11/15/19 1600  BP: 130/83 128/86 (!) 132/101 (!) (P) 142/104  Pulse: 95 97 90 (P) 98  Resp: (!) 23   (P) 16  Temp:    (P) 97.8 F (36.6 C)  TempSrc:    (P) Oral  SpO2:      Weight:      Height:        Intake/Output Summary (Last 24 hours) at 11/15/2019 1632 Last data filed at 11/15/2019 0855 Gross per 24 hour  Intake 723 ml  Output 150 ml  Net 573 ml   Filed Weights   11/13/19 1009 11/15/19 0336 11/15/19 1215  Weight: 67.1 kg 63.7 kg 63.2 kg    Examination:  General exam: Appears calm and comfortable  Respiratory system: Clear to auscultation. Respiratory effort normal. Cardiovascular system: S1 & S2 heard, RRR. No JVD, murmurs, rubs, gallops or clicks. No pedal edema. Gastrointestinal system: Abdomen is nondistended, soft and nontender. No organomegaly or masses felt. Normal bowel sounds heard. Central nervous system: Alert and oriented. No focal neurological deficits. Extremities: Symmetric 5 x 5 power. Skin: No rashes, lesions or ulcers Psychiatry: Judgement and insight appear normal. Mood & affect appropriate.     Data Reviewed: I have personally reviewed following labs and imaging studies  CBC: Recent Labs  Lab 11/11/19 1700 11/12/19 0428 11/13/19 0359 11/14/19 0531 11/15/19 0911  WBC 12.2* 11.0* 15.8* 14.7* 21.2*  HGB 10.9* 10.5* 10.5* 11.8* 12.0*  HCT 35.8* 34.3* 32.7* 37.1* 38.9*  MCV 93.0 93.5 90.1 90.5 92.2  PLT 67* 51*  48* 54* 48* 49*   Basic Metabolic Panel: Recent Labs  Lab 11/10/19 0559 11/11/19 0043 11/11/19 0339 11/11/19 0417 11/11/19 1708 11/12/19 0428 11/12/19 2307 11/13/19 0359 11/14/19 0531 11/15/19 0911  NA 136   < >  --    < > 138 134*  --  138 136 134*  K 6.1*   < >   --    < > 5.0 4.8  --  5.1 4.9 4.9  CL 96*   < >  --    < > 102 98  --  100 99 98  CO2 16*   < >  --    < > 23 25  --  27 26 19*  GLUCOSE 87   < >  --    < > 99 242*  --  210* 119* 129*  BUN 77*   < >  --    < > 48* 28*  --  55* 51* 74*  CREATININE 11.96*   < >  --    < > 7.70* 4.61*  --  5.84* 4.99* 6.46*  CALCIUM 8.1*   < >  --    < >  7.3* 7.7*  --  7.6* 8.3* 8.2*  MG 2.2  --  1.7  --  1.8 1.9 2.0  --   --   --   PHOS  --   --  7.3*  --  7.6* 6.1* 5.4*  --   --   --    < > = values in this interval not displayed.   GFR: Estimated Creatinine Clearance: 10.7 mL/min (A) (by C-G formula based on SCr of 6.46 mg/dL (H)). Liver Function Tests: Recent Labs  Lab 11/11/19 0339 11/11/19 0339 11/11/19 1708 11/12/19 0428 11/13/19 0359 11/14/19 0531 11/15/19 0911  AST 1,672*  --   --  686* 347* 152* 79*  ALT 1,133*  --   --  788* 621* 461* 327*  ALKPHOS 177*  --   --  148* 216* 220* 228*  BILITOT 3.1*  --   --  3.9* 2.0* 1.3* 1.0  PROT 5.2*  --   --  5.2* 5.3* 5.6* 6.0*  ALBUMIN 1.7*   < > 2.1* 2.3* 2.3* 2.3* 2.4*   < > = values in this interval not displayed.   No results for input(s): LIPASE, AMYLASE in the last 168 hours. No results for input(s): AMMONIA in the last 168 hours. Coagulation Profile: Recent Labs  Lab 11/10/19 0000 11/12/19 0428  INR 2.9* 2.5*   Cardiac Enzymes: No results for input(s): CKTOTAL, CKMB, CKMBINDEX, TROPONINI in the last 168 hours. BNP (last 3 results) No results for input(s): PROBNP in the last 8760 hours. HbA1C: No results for input(s): HGBA1C in the last 72 hours. CBG: Recent Labs  Lab 11/14/19 1628 11/14/19 2247 11/15/19 0447 11/15/19 0755 11/15/19 1150  GLUCAP 125* 113* 94 79 111*   Lipid Profile: No results for input(s): CHOL, HDL, LDLCALC, TRIG, CHOLHDL, LDLDIRECT in the last 72 hours. Thyroid Function Tests: No results for input(s): TSH, T4TOTAL, FREET4, T3FREE, THYROIDAB in the last 72 hours. Anemia Panel: No results for input(s):  VITAMINB12, FOLATE, FERRITIN, TIBC, IRON, RETICCTPCT in the last 72 hours. Sepsis Labs: Recent Labs  Lab 11/10/19 0015 11/10/19 0559 11/11/19 0043 11/11/19 0339  PROCALCITON  --   --   --  8.54  LATICACIDVEN 10.5* 6.7* 2.1*  --     Recent Results (from the past 240 hour(s))  SARS Coronavirus 2 by RT PCR (hospital order, performed in [facility] hospital lab) Nasopharyngeal Nasopharyngeal Swab     Status: None   Collection Time: 11/09/19 10:29 PM   Specimen: Nasopharyngeal Swab  Result Value Ref Range Status   SARS Coronavirus 2 NEGATIVE NEGATIVE Final    Comment: (NOTE) SARS-CoV-2 target nucleic acids are NOT DETECTED.  The SARS-CoV-2 RNA is generally detectable in upper and lower respiratory specimens during the acute phase of infection. The lowest concentration of SARS-CoV-2 viral copies this assay can detect is 250 copies / mL. A negative result does not preclude SARS-CoV-2 infection and should not be used as the sole basis for treatment or other patient management decisions.  A negative result may occur with improper specimen collection / handling, submission of specimen other than nasopharyngeal swab, presence of viral mutation(s) within the areas targeted by this assay, and inadequate number of viral copies (<250 copies / mL). A negative result must be combined with clinical observations, patient history, and epidemiological information.  Fact Sheet for Patients:   StrictlyIdeas.no  Fact Sheet for Healthcare Providers: BankingDealers.co.za  This test is not yet approved or  cleared by the [place] FDA and has been  authorized for detection and/or diagnosis of SARS-CoV-2 by FDA under an Emergency Use Authorization (EUA).  This EUA will remain in effect (meaning this test can be used) for the duration of the COVID-19 declaration under Section 564(b)(1) of the Act, 21 U.S.C. section 360bbb-3(b)(1), unless the  authorization is terminated or revoked sooner.  Performed at [facility], [place] [address] [place], [place] [address]   Blood culture (routine single)     Status: None   Collection Time: 11/10/19 12:05 AM   Specimen: BLOOD  Result Value Ref Range Status   Specimen Description BLOOD LEFT ANTECUBITAL  Final   Special Requests   Final    BOTTLES DRAWN AEROBIC AND ANAEROBIC Blood Culture adequate volume   Culture   Final    NO GROWTH 5 DAYS Performed at [facility], [place] [address] [place], [place] [address]    Report Status 11/15/2019 FINAL  Final  MRSA PCR Screening     Status: None   Collection Time: 11/10/19  5:31 AM   Specimen: Nasal Mucosa; Nasopharyngeal  Result Value Ref Range Status   MRSA by PCR NEGATIVE NEGATIVE Final    Comment:        The GeneXpert MRSA Assay (FDA approved for NASAL specimens only), is one component of a comprehensive MRSA colonization surveillance program. It is not intended to diagnose MRSA infection nor to guide or monitor treatment for MRSA infections. Performed at [facility], [place] [address] [place], [place] [address]   Culture, blood (routine x 2)     Status: None   Collection Time: 11/10/19  5:59 AM   Specimen: BLOOD LEFT HAND  Result Value Ref Range Status   Specimen Description BLOOD LEFT HAND  Final   Special Requests   Final    BOTTLES DRAWN AEROBIC ONLY Blood Culture results may not be optimal due to an inadequate volume of blood received in culture bottles   Culture   Final    NO GROWTH 5 DAYS Performed at [facility], [place] [address], [place], [place] [address]    Report Status 11/15/2019 FINAL  Final  Culture, blood (routine x 2)     Status: None   Collection Time: 11/10/19  6:00 AM   Specimen: BLOOD LEFT HAND  Result Value Ref Range Status   Specimen Description BLOOD LEFT HAND  Final   Special Requests   Final    BOTTLES DRAWN AEROBIC ONLY Blood Culture results may not be optimal due to an  inadequate volume of blood received in culture bottles   Culture   Final    NO GROWTH 5 DAYS Performed at [facility], [place] [address], [place], [place] [address]    Report Status 11/15/2019 FINAL  Final  Culture, respiratory (non-expectorated)     Status: None   Collection Time: 11/11/19  9:18 AM   Specimen: Tracheal Aspirate; Respiratory  Result Value Ref Range Status   Specimen Description TRACHEAL ASPIRATE  Final   Special Requests NONE  Final   Gram Stain   Final    MODERATE WBC PRESENT, PREDOMINANTLY PMN ABUNDANT GRAM NEGATIVE RODS    Culture   Final    MODERATE HAEMOPHILUS INFLUENZAE BETA LACTAMASE NEGATIVE Performed at [place] [facility], [place] [address] [place], [place] [address]    Report Status 11/13/2019 FINAL  Final    Radiology Studies: DG Swallowing Func-Speech Pathology  Result Date: 11/14/2019 Objective Swallowing Evaluation: Type of Study: MBS-Modified Barium  Swallow Study  Patient Details Name: Ajani Rineer MRN: [id number] Date of Birth: [birth year] Today's Date: 11/14/2019 Time: SLP Start Time (ACUTE ONLY): 1038 -SLP Stop Time (ACUTE ONLY): 1052 SLP Time Calculation (min) (ACUTE ONLY): 14 min Past Medical History: Past Medical History: Diagnosis Date . Chronic hepatitis C ([place])  . COPD (chronic obstructive pulmonary disease) ([place]) 09/08/2019 . ESRD (end stage renal disease) ([place])   HD Tues, Thurs, Sat . Essential hypertension  . Mixed hyperlipidemia  . Nicotine dependence, cigarettes, uncomplicated  . Seizure disorder ([place]) 09/08/2019 Past Surgical History: Past Surgical History: Procedure Laterality Date . AV FISTULA PLACEMENT Left 09/15/2019  Procedure: ARTERIOVENOUS (AV) FISTULA CREATION LEFT ARM;  Surgeon: [name], MD;  Location: [facility] OR;  Service: Vascular;  Laterality: Left; HPI: Pt is a 59 year old man with hx of ESRD on dialysis, Hep C, COPD, HTN, HLD, seizure hx (?). He was found down and unresponsive at home, and found to be in asystolic cardiac arrest,  received 4 min CPR, developed Vfib/vtach, defibrillated twice with total 10-12 minute resuscitation. He was intubated by EMS on 9/6 and extubated 9/9. CXR 9/9: Diffuse bilateral interstitial infiltrates. Palliative care has been consulted and the family would like pt to be reintubated if needed as of 9/9.  No data recorded Assessment / Plan / Recommendation CHL IP CLINICAL IMPRESSIONS 11/14/2019 Clinical Impression Pt was seen in radiology suite for modified barium swallow study. Trials of puree solids, regular texture solids, mixed consistency boluses (mechanical soft with thin liquids), a 68mm barium tablet, and thin liquids via cup and straw were administered. Pt's oropharyngeal swallow mechanism was within functional limits. Coughing was intermittently noted during the study; however, no instances of penetration or aspiration were demonstrated and coughing therefore appears to be unrelated to swallowing. It is recommended that a dysphagia 3 diet with thin liquids be initiated at this time. SLP will see patient once more to ensure tolerance of the diet; however, further skilled SLP services will likely not be needed beyond that.  SLP Visit Diagnosis Dysphagia, unspecified (R13.10) Attention and concentration deficit following -- Frontal lobe and executive function deficit following -- Impact on safety and function Mild aspiration risk   CHL IP TREATMENT RECOMMENDATION 11/14/2019 Treatment Recommendations Therapy as outlined in treatment plan below   Prognosis 11/14/2019 Prognosis for Safe Diet Advancement Good Barriers to Reach Goals Cognitive deficits Barriers/Prognosis Comment -- CHL IP DIET RECOMMENDATION 11/14/2019 SLP Diet Recommendations Dysphagia 3 (Mech soft) solids;Thin liquid Liquid Administration via Cup;Straw Medication Administration Whole meds with liquid Compensations Slow rate;Small sips/bites Postural Changes Remain semi-upright after after feeds/meals (Comment)   CHL IP OTHER RECOMMENDATIONS  11/14/2019 Recommended Consults -- Oral Care Recommendations Oral care BID Other Recommendations --   CHL IP FOLLOW UP RECOMMENDATIONS 11/14/2019 Follow up Recommendations None   CHL IP FREQUENCY AND DURATION 11/14/2019 Speech Therapy Frequency (ACUTE ONLY) min 1 x/week Treatment Duration 1 week      CHL IP ORAL PHASE 11/14/2019 Oral Phase WFL Oral - Pudding Teaspoon -- Oral - Pudding Cup -- Oral - Honey Teaspoon -- Oral - Honey Cup -- Oral - Nectar Teaspoon -- Oral - Nectar Cup -- Oral - Nectar Straw -- Oral - Thin Teaspoon -- Oral - Thin Cup -- Oral - Thin Straw -- Oral - Puree -- Oral - Mech Soft -- Oral - Regular -- Oral - Multi-Consistency -- Oral - Pill -- Oral Phase - Comment --  CHL IP PHARYNGEAL PHASE 11/14/2019 Pharyngeal Phase WFL Pharyngeal- Pudding Teaspoon --  Pharyngeal -- Pharyngeal- Pudding Cup -- Pharyngeal -- Pharyngeal- Honey Teaspoon -- Pharyngeal -- Pharyngeal- Honey Cup -- Pharyngeal -- Pharyngeal- Nectar Teaspoon -- Pharyngeal -- Pharyngeal- Nectar Cup -- Pharyngeal -- Pharyngeal- Nectar Straw -- Pharyngeal -- Pharyngeal- Thin Teaspoon -- Pharyngeal -- Pharyngeal- Thin Cup -- Pharyngeal -- Pharyngeal- Thin Straw -- Pharyngeal -- Pharyngeal- Puree -- Pharyngeal -- Pharyngeal- Mechanical Soft -- Pharyngeal -- Pharyngeal- Regular -- Pharyngeal -- Pharyngeal- Multi-consistency -- Pharyngeal -- Pharyngeal- Pill -- Pharyngeal -- Pharyngeal Comment --  CHL IP CERVICAL ESOPHAGEAL PHASE 11/14/2019 Cervical Esophageal Phase WFL Pudding Teaspoon -- Pudding Cup -- Honey Teaspoon -- Honey Cup -- Nectar Teaspoon -- Nectar Cup -- Nectar Straw -- Thin Teaspoon -- Thin Cup -- Thin Straw -- Puree -- Mechanical Soft -- Regular -- Multi-consistency -- Pill -- Cervical Esophageal Comment -- Shanika I. Hardin Negus, [place], [place] Office number [phone] Pager [place] 11/14/2019, 11:04 AM               Scheduled Meds: . atorvastatin  40 mg Oral Daily  .  buprenorphine-naloxone  1 tablet Sublingual Daily  . Chlorhexidine Gluconate Cloth  6 each Topical Q0600  . docusate sodium  100 mg Oral BID  . feeding supplement (NEPRO CARB STEADY)  237 mL Oral BID BM  . heparin injection (subcutaneous)  5,000 Units Subcutaneous Q8H  . heparin sodium (porcine)      . insulin aspart  0-15 Units Subcutaneous Q4H  . lidocaine  2 patch Transdermal Q24H  . mouth rinse  15 mL Mouth Rinse BID  . metoprolol tartrate  25 mg Oral BID  . multivitamin  1 tablet Oral QHS  . pantoprazole  40 mg Oral QHS  . polyethylene glycol  17 g Oral Daily  . sodium chloride flush  10-40 mL Intracatheter Q12H   Continuous Infusions: . sodium chloride Stopped (11/14/19 1300)  . cefTRIAXone (ROCEPHIN)  IV 2 g (11/15/19 0826)     LOS: 5 days    Time spent: 35 mins.    Shawna Clamp, MD [facility]   If 7PM-7AM, please contact night-coverage

## 2019-11-15 NOTE — Progress Notes (Signed)
[facility] Progress Note  Subjective: pt awake and somewhat confused, having a hard time stringing words into a sentence.    Vitals:   11/15/19 0256 11/15/19 0336 11/15/19 0802 11/15/19 1152  BP: (!) 153/97 140/90 (!) 158/97 (!) 148/105  Pulse: (!) 106 (!) 109 80 65  Resp: 19 18 20 18   Temp: 97.8 F (36.6 C) 97.8 F (36.6 C) 97.8 F (36.6 C) (!) 97.5 F (36.4 C)  TempSrc: Oral Oral Oral Oral  SpO2: 93% 93% 90% (!) 85%  Weight:  63.7 kg    Height:        Exam: Gen on vent, awake, chron ill appearing, frail No jvd or bruits Chest bilat rhonchi  RRR no MRG Abd soft ntnd no mass or ascites +bs Ext no leg or UE edema Neuro is awake, engages in conversation R IJ TDC exit c/d     CXR - IMPRESSION: 1. Endotracheal tube tip is difficult to visualize but likely at the level of the clavicular heads. 2. Enteric tube in place with tip and side-port below the diaphragm in the stomach. 3. Increasing peribronchial and interstitial thickening, may be pulmonary edema or atypical infection.   Home meds:  - lipitor 40/ hydralazine 50 bid/ metoprolol 25 bid/ sod bicarb bid  - suboxone 8-2 mg film 1 qd/ klonopin 1 mg hs/ neurontin 300 qd  - flomax/  prn's/ vitamins/ supplements    CXR 9/6 - IMPRESSION: 1. Left subclavian central venous catheter tip projects over the mid SVC.... 4. Unchanged peribronchial and interstitial thickening.     CTA chest 9/7 yest - IMPRESSION: 1. No evidence for pulmonary embolus. 2. Patchy airspace opacities at the lung apices bilaterally, right greater than left. Findings are concerning for pneumonia. 3. Small right pleural effusion. 4. Endotracheal tube and NG tube are in satisfactory position. 5. Aortic Atherosclerosis (ICD10-I70.0).    OP HD: TTS [facility]  4h  2/2.25 bath  TDC  60.5kg   Hep 4000  - calc 0.25 ug po tiw  Assessment/ Plan: 1. Cardiac arrest - PEA, then vtach/ vib. No further issues.  2. Resp arrest/ prob PNA - on vent,  poss asp PNA, on abx. Extubated yest 9/09.   3. Shock/ tachycardia-  getting midodrine, off pressors. BP's better 4. ESRD - usual HD TTS. HD today.  5. Hyperkalemia - severe, resolved w/ HD.  6. Shock liver/ DIC/ low plts - getting heparin ppx per ccm 7. Vol - up 3 kg now, improving. Plan UF 3 L w/ hd today.  8. COPD 9. H/o opioid abuse - on suboxone 10. H/o seizure d/o  11. DNR 12. Substance abuse - very frail w/ declining overall health 13. FTT - pt has been declining per HD staff for the last several mos. Appreciate pall care team assistance.      Rob Jaxten Brosh 11/15/2019, 12:19 PM   Recent Labs  Lab 11/12/19 0428 11/12/19 2307 11/13/19 0359 11/14/19 0531 11/15/19 0911  K 4.8  --    < > 4.9 4.9  BUN 28*  --    < > 51* 74*  CREATININE 4.61*  --    < > 4.99* 6.46*  CALCIUM 7.7*  --    < > 8.3* 8.2*  PHOS 6.1* 5.4*  --   --   --   HGB 10.5*  --    < > 11.8* 12.0*   < > = values in this interval not displayed.   Inpatient medications: . atorvastatin  40  mg Oral Daily  . buprenorphine-naloxone  1 tablet Sublingual Daily  . Chlorhexidine Gluconate Cloth  6 each Topical Q0600  . docusate sodium  100 mg Oral BID  . feeding supplement (NEPRO CARB STEADY)  237 mL Oral BID BM  . heparin injection (subcutaneous)  5,000 Units Subcutaneous Q8H  . insulin aspart  0-15 Units Subcutaneous Q4H  . lidocaine  2 patch Transdermal Q24H  . mouth rinse  15 mL Mouth Rinse BID  . metoprolol tartrate  25 mg Oral BID  . multivitamin  1 tablet Oral QHS  . pantoprazole  40 mg Oral QHS  . polyethylene glycol  17 g Oral Daily  . sodium chloride flush  10-40 mL Intracatheter Q12H   . sodium chloride Stopped (11/14/19 1300)  . cefTRIAXone (ROCEPHIN)  IV 2 g (11/15/19 0826)   sodium chloride, acetaminophen, heparin sodium (porcine), ipratropium-albuterol, metoprolol tartrate, sodium chloride flush

## 2019-11-16 ENCOUNTER — Inpatient Hospital Stay (HOSPITAL_COMMUNITY): Payer: Medicaid Other

## 2019-11-16 DIAGNOSIS — Z515 Encounter for palliative care: Secondary | ICD-10-CM

## 2019-11-16 LAB — MAGNESIUM: Magnesium: 2 mg/dL (ref 1.7–2.4)

## 2019-11-16 LAB — CBC
HCT: 39.3 % (ref 39.0–52.0)
Hemoglobin: 12.6 g/dL — ABNORMAL LOW (ref 13.0–17.0)
MCH: 29.3 pg (ref 26.0–34.0)
MCHC: 32.1 g/dL (ref 30.0–36.0)
MCV: 91.4 fL (ref 80.0–100.0)
Platelets: 55 10*3/uL — ABNORMAL LOW (ref 150–400)
RBC: 4.3 MIL/uL (ref 4.22–5.81)
RDW: 17.4 % — ABNORMAL HIGH (ref 11.5–15.5)
WBC: 11.9 10*3/uL — ABNORMAL HIGH (ref 4.0–10.5)
nRBC: 0.3 % — ABNORMAL HIGH (ref 0.0–0.2)

## 2019-11-16 LAB — GLUCOSE, CAPILLARY
Glucose-Capillary: 116 mg/dL — ABNORMAL HIGH (ref 70–99)
Glucose-Capillary: 138 mg/dL — ABNORMAL HIGH (ref 70–99)
Glucose-Capillary: 142 mg/dL — ABNORMAL HIGH (ref 70–99)
Glucose-Capillary: 94 mg/dL (ref 70–99)
Glucose-Capillary: 95 mg/dL (ref 70–99)

## 2019-11-16 LAB — COMPREHENSIVE METABOLIC PANEL
ALT: 276 U/L — ABNORMAL HIGH (ref 0–44)
AST: 60 U/L — ABNORMAL HIGH (ref 15–41)
Albumin: 2.4 g/dL — ABNORMAL LOW (ref 3.5–5.0)
Alkaline Phosphatase: 229 U/L — ABNORMAL HIGH (ref 38–126)
Anion gap: 12 (ref 5–15)
BUN: 36 mg/dL — ABNORMAL HIGH (ref 8–23)
CO2: 26 mmol/L (ref 22–32)
Calcium: 8.2 mg/dL — ABNORMAL LOW (ref 8.9–10.3)
Chloride: 98 mmol/L (ref 98–111)
Creatinine, Ser: 4.16 mg/dL — ABNORMAL HIGH (ref 0.61–1.24)
GFR calc Af Amer: 17 mL/min — ABNORMAL LOW (ref >60)
GFR calc non Af Amer: 14 mL/min — ABNORMAL LOW (ref >60)
Glucose, Bld: 123 mg/dL — ABNORMAL HIGH (ref 70–99)
Potassium: 4.2 mmol/L (ref 3.5–5.1)
Sodium: 136 mmol/L (ref 135–145)
Total Bilirubin: 1.3 mg/dL — ABNORMAL HIGH (ref 0.3–1.2)
Total Protein: 6.2 g/dL — ABNORMAL LOW (ref 6.5–8.1)

## 2019-11-16 LAB — BLOOD GAS, ARTERIAL
Acid-Base Excess: 0.9 mmol/L (ref 0.0–2.0)
Bicarbonate: 26.6 mmol/L (ref 20.0–28.0)
Drawn by: 28340
FIO2: 50
O2 Saturation: 99.3 %
Patient temperature: 37
pCO2 arterial: 55.6 mmHg — ABNORMAL HIGH (ref 32.0–48.0)
pH, Arterial: 7.3 — ABNORMAL LOW (ref 7.350–7.450)
pO2, Arterial: 162 mmHg — ABNORMAL HIGH (ref 83.0–108.0)

## 2019-11-16 LAB — PHOSPHORUS: Phosphorus: 4.3 mg/dL (ref 2.5–4.6)

## 2019-11-16 LAB — PROCALCITONIN: Procalcitonin: 3.69 ng/mL

## 2019-11-16 MED ORDER — HYPROMELLOSE (GONIOSCOPIC) 2.5 % OP SOLN: 1.0000 [drp] | Freq: Three times a day (TID) | OPHTHALMIC | Status: DC | PRN

## 2019-11-16 MED ORDER — METRONIDAZOLE IN NACL 5-0.79 MG/ML-% IV SOLN
500.0000 mg | Freq: Three times a day (TID) | INTRAVENOUS | Status: DC
  Administered 2019-11-16: 500 mg via INTRAVENOUS
  Filled 2019-11-16: qty 100

## 2019-11-16 MED ORDER — LORAZEPAM 2 MG/ML IJ SOLN
0.5000 mg | INTRAMUSCULAR | Status: DC | PRN
  Filled 2019-11-16: qty 1

## 2019-11-16 MED ORDER — HYPROMELLOSE (GONIOSCOPIC) 2.5 % OP SOLN
1.0000 [drp] | Freq: Three times a day (TID) | OPHTHALMIC | Status: DC | PRN
  Filled 2019-11-16: qty 15

## 2019-11-16 MED ORDER — HYDROMORPHONE HCL 1 MG/ML IJ SOLN
1.0000 mg | INTRAMUSCULAR | Status: DC | PRN
  Filled 2019-11-16: qty 1

## 2019-11-16 MED ORDER — BISACODYL 10 MG RE SUPP: 10.0000 mg | Freq: Every day | RECTAL | Status: DC | PRN

## 2019-11-16 MED ORDER — VANCOMYCIN HCL IN DEXTROSE 750-5 MG/150ML-% IV SOLN: 750.0000 mg | INTRAVENOUS | Status: DC

## 2019-11-16 MED ORDER — BIOTENE DRY MOUTH MT LIQD: 15.0000 mL | OROMUCOSAL | Status: DC | PRN

## 2019-11-16 MED ORDER — VANCOMYCIN HCL 1250 MG/250ML IV SOLN
1250.0000 mg | Freq: Once | INTRAVENOUS | Status: AC
  Administered 2019-11-16: 1250 mg via INTRAVENOUS
  Filled 2019-11-16: qty 250

## 2019-11-16 MED ORDER — GLYCOPYRROLATE 0.2 MG/ML IJ SOLN: 0.4000 mg | INTRAMUSCULAR | Status: DC | PRN

## 2019-11-16 MED ORDER — HALOPERIDOL LACTATE 5 MG/ML IJ SOLN
2.5000 mg | Freq: Once | INTRAMUSCULAR | Status: AC
  Administered 2019-11-16: 2.5 mg via INTRAVENOUS
  Filled 2019-11-16: qty 1

## 2019-11-16 MED ORDER — ONDANSETRON HCL 4 MG/2ML IJ SOLN: 4.0000 mg | Freq: Four times a day (QID) | INTRAMUSCULAR | Status: DC | PRN

## 2019-11-16 NOTE — Progress Notes (Signed)
Pharmacy Antibiotic Note  Ralph Becker is a 59 y.o. male  with AMS/hypoxia, possible sepsis.  Pharmacy has been consulted for Vancomycin  dosing.  Plan: Vancomycin 1250 mg IV now, then 750 mg IV after each HD  Height: 5\' 9"  (175.3 cm) Weight: 63.2 kg (139 lb 5.3 oz) IBW/kg (Calculated) : 70.7  Temp (24hrs), Avg:97.8 F (36.6 C), Min:97.5 F (36.4 C), Max:97.9 F (36.6 C)  Recent Labs  Lab 11/10/19 0000 11/10/19 0015 11/10/19 0559 11/10/19 0559 11/11/19 0043 11/11/19 0417 11/11/19 1700 11/11/19 1708 11/12/19 0428 11/13/19 0359 11/14/19 0531 11/15/19 0911 11/15/19 2215  WBC   < >  --  8.3  --   --    < > 12.2*  --  11.0* 15.8* 14.7* 21.2*  --   CREATININE   < >  --  11.96*   < > 7.57*   < >  --    < > 4.61* 5.84* 4.99* 6.46* 3.94*  LATICACIDVEN  --  10.5* 6.7*  --  2.1*  --   --   --   --   --   --   --   --    < > = values in this interval not displayed.    Estimated Creatinine Clearance: 17.6 mL/min (A) (by C-G formula based on SCr of 3.94 mg/dL (H)).    Allergies  Allergen Reactions  . Penicillins     Unknown. Can't remember.      Caryl Pina 11/16/2019 12:45 AM

## 2019-11-16 NOTE — Progress Notes (Signed)
Patient traumatically removed HD cath. Rapid notified, see note. MD notified, see new orders.

## 2019-11-16 NOTE — Progress Notes (Addendum)
   Palliative Medicine Inpatient Follow Up Note  Reason for Consultation: Establishing goals of care  HPI/Patient Profile: 59 y.o. male  with past medical history of ESRD on dialysis TTS, hepatitis C, COPD, HTN, HLD, seizures, hepatic steatosis, degenerative disc disease, nicotine dependence, severe malnutrition with failure to thrive. Recent admission in June [facility] with COPD exacerbation requiring intubation and dialysis initiated. Admitted on 11/09/2019 with cardiac arrest at home and total time resuscitated 10-12 minutes with PEA and then VT/VF. Complicated by aspiration pneumonitis vs pneumonia, RV failure, shock liver.    Today's Discussion (11/16/2019): Chart reviewed. I touched base with Jajuan's evening RN. He has pulled his HD cath out overnight and required Bipap. I met with Afton at bedside - he was aware of the overnight events. He states that he feels he is doing "fine" this morning. He did not complain of back pain this morning. We discussed that I had spoke to his brother yesterday which he was thankful of.    Discussed the importance of continued conversation with family and their  medical providers regarding overall plan of care and treatment options, ensuring decisions are within the context of the patients values and GOCs.  Questions and concerns addressed   Time Spent: 15 Greater than 50% of the time was spent in counseling and coordination of care _________________________________________________________ Addendum: Spoke with Dr. Schertz this afternoon. He shared that in lieu of Dmarcus pulling out his HD catheter it may be a reasonable time to broach the topic of hospice with his brother Ronald. Dr. Schertz was able to call him thereafter - the decision was made to no longer pursue HD.   I called Ronald thereafter and we discussed transition to comfort oriented care. We talked about transition to comfort measures in house and what that would entail inclusive of medications to  control pain, dyspnea, agitation, nausea, itching, and hiccups.  We discussed stopping all uneccessary measures such as blood draws, needle sticks, and frequent vital signs. We discussed the plan for transition to residential hospice.  Medical team informed - transitioned to comfort thereafter  In: 1315 Out: 1345 Total Time: 30 minutes Greater than 50% of the time was spent in counseling and coordination of care  SUMMARY OF RECOMMENDATIONS   DNAR/DNI  MOST completed  Transition to comfort Care  TOC --> Residential Hospice  Dyspnea: Pain:  - Dilaudid 1-2mg IVP Q1H PRN Fever:  - Tylenol 650mg PO/PR Q6H PRN Agitation: Anxiety:  - Lorazepam 1mg IV  Q1H PRN Nausea:  - Zofran 4mgIV Q6H PRN  Secretions:  - Glycopyrrolate 0.4mg IV Q4H PRN Dry Eyes:  - Artificial Tears PRN Xerostomia:  - Biotene 15ml PRN  - BID oral care Urinary Retention:  - Bladder scan if > 250ml place and maintain foley  Constipation:  - Bisacodyl 10mg PR PRN QDay Spiritual:  - Chaplain consult  ______________________________________________________________________________________   [facility] Team Team Cell Phone: [phone] Please utilize secure chat with additional questions, if there is no response within 30 minutes please call the above phone number  Palliative Medicine Team providers are available by phone from 7am to 7pm daily and can be reached through the team cell phone.  Should this patient require assistance outside of these hours, please call the patient's attending physician.     

## 2019-11-16 NOTE — Progress Notes (Addendum)
[facility] Progress Note  Subjective: pt pulled HD cath out last night  Vitals:   11/16/19 0135 11/16/19 0415 11/16/19 0808 11/16/19 1100  BP: (!) 112/94 (!) 142/89 (!) 156/140 128/77  Pulse: 92 99  95  Resp: (!) 22 18 19 18   Temp: 98.2 F (36.8 C) 98 F (36.7 C) (!) 97.4 F (36.3 C) 98.2 F (36.8 C)  TempSrc: Oral Oral Oral Oral  SpO2: 100% 98%  96%  Weight:  61.3 kg    Height:        Exam: Gen on vent, awake, chron ill appearing, frail No jvd or bruits Chest bilat rhonchi  RRR no MRG Abd soft ntnd no mass or ascites +bs Ext no leg or UE edema Neuro is awake, engages in conversation R IJ TDC exit c/d     CXR - IMPRESSION: 1. Endotracheal tube tip is difficult to visualize but likely at the level of the clavicular heads. 2. Enteric tube in place with tip and side-port below the diaphragm in the stomach. 3. Increasing peribronchial and interstitial thickening, may be pulmonary edema or atypical infection.   Home meds:  - lipitor 40/ hydralazine 50 bid/ metoprolol 25 bid/ sod bicarb bid  - suboxone 8-2 mg film 1 qd/ klonopin 1 mg hs/ neurontin 300 qd  - flomax/  prn's/ vitamins/ supplements    CXR 9/6 - IMPRESSION: 1. Left subclavian central venous catheter tip projects over the mid SVC.... 4. Unchanged peribronchial and interstitial thickening.     CTA chest 9/7 yest - IMPRESSION: 1. No evidence for pulmonary embolus. 2. Patchy airspace opacities at the lung apices bilaterally, right greater than left. Findings are concerning for pneumonia. 3. Small right pleural effusion. 4. Endotracheal tube and NG tube are in satisfactory position. 5. Aortic Atherosclerosis (ICD10-I70.0).    OP HD: TTS [facility]  4h  2/2.25 bath  TDC  60.5kg   Hep 4000  - calc 0.25 ug po tiw  Assessment: 1. Cardiac arrest - PEA, then vtach/ vib. No further issues.  2. Resp arrest/ prob PNA - on vent, poss asp PNA, on abx. Extubated yest 9/09.   3. Shock/ tachycardia-   getting midodrine, off pressors. BP's better 4. ESRD - usual HD TTS. Had HD yest. 5. Hyperkalemia - severe, resolved w/ HD.  6. Shock liver/ DIC/ low plts - getting heparin ppx per ccm 7. Vol - up 3 kg now, improving. Plan UF 3 L w/ hd today.  8. H/o opioid abuse - on suboxone 9. H/o seizure d/o  10. DNR 11. Substance abuse - very frail w/ declining overall health   Plan: pt has been declining per HD staff for the last several mos. He is confused , acute and chronically ill and very debilitated and unlikely to recover given all his comorbidities. At this juncture would consider transition to hospice care. D/W brother who is in favor of hospice and will speak further w/ Pall care team later about the details.  Have d/w pmd as well.  No further dialysis. Will sign off.      Rob Azzam Mehra 11/16/2019, 12:51 PM   Recent Labs  Lab 11/15/19 0911 11/15/19 0911 11/15/19 2215 11/16/19 0059  K 4.9   < > 4.0 4.2  BUN 74*   < > 34* 36*  CREATININE 6.46*   < > 3.94* 4.16*  CALCIUM 8.2*   < > 8.1* 8.2*  PHOS  --   --  3.8 4.3  HGB 12.0*  --   --  12.6*   < > = values in this interval not displayed.   Inpatient medications: . atorvastatin  40 mg Oral Daily  . buprenorphine-naloxone  1 tablet Sublingual Daily  . Chlorhexidine Gluconate Cloth  6 each Topical Q0600  . docusate sodium  100 mg Oral BID  . feeding supplement (NEPRO CARB STEADY)  237 mL Oral BID BM  . heparin injection (subcutaneous)  5,000 Units Subcutaneous Q8H  . insulin aspart  0-15 Units Subcutaneous Q4H  . lidocaine  2 patch Transdermal Q24H  . mouth rinse  15 mL Mouth Rinse BID  . metoprolol tartrate  25 mg Oral BID  . multivitamin  1 tablet Oral QHS  . pantoprazole  40 mg Oral QHS  . polyethylene glycol  17 g Oral Daily  . sodium chloride flush  10-40 mL Intracatheter Q12H   . sodium chloride Stopped (11/14/19 1300)  . cefTRIAXone (ROCEPHIN)  IV 2 g (11/16/19 1124)  . metronidazole    . [START ON 11/18/2019]  vancomycin     sodium chloride, acetaminophen, heparin sodium (porcine), ipratropium-albuterol, metoprolol tartrate, sodium chloride flush

## 2019-11-16 NOTE — Social Work (Addendum)
CSW was messaged that patient is being referred for residential hospice. CSW attempted to follow up with patient's brotherPaulo Becker) however had to leave message.  3:37pm- attempted to call brother back however unable to reach him again.   3:50pm- CSW spoke with patient's bother Ralph Becker) and he is in agreement with patient going to residential hospice. Roland informed CSW that he would prefer that patient be placed at the facility in  [place]. CSW explained that another staff from [place] will be following up about a referral.  4:30pm- CSW spoke with Bradd Canary from [facility] about patient and she informed CSW that she would follow up with brother in regards to referral to be made.

## 2019-11-16 NOTE — Plan of Care (Signed)
°  Problem: Coping: °Goal: Level of anxiety will decrease °Outcome: Progressing °  °

## 2019-11-16 NOTE — Progress Notes (Signed)
PROGRESS NOTE    Ralph Becker  HAL:[id number] DOB: 08-31-1960 DOA: 11/09/2019 PCP: Patient, No Pcp Per   Brief Narrative: 59 years old male with history of end-stage renal disease on hemo dialysisT/T/S, hepatitis C, COPD, hypertension, hyperlipidemia, seizure disorder who was found unresponsive at home, last known normal was 4 mins ago. Patient was found to be in asystole with cardiac arrest.  Patient received 4 minutes of CPR,  developed V. Fib / V. Tach.  He was defibrillated twice,  total duration of resuscitation 10 to 12 minutes.  He was intubated by EMS , Patient was admitted in the ICU and was given Keppra, calcium, insulin, D5W in the ED.  CTA Chest:  no PE, there was some  patchy infiltrates,  started on antibiotic for aspiration pneumonia. Patient was continued on hemodialysis.  Palliative care was consulted. Patient is DNR.  He was successfully extubated 9/9, PCCM pickup 911.  Patient was admitted status post cardiac arrest with PEA then V. Tach/ V. fib could be secondary to missing hemodialysis found to have a potassium of 7.4. continued on hemodialysis,  Success fully extubated 9/9 ,  Also found to have aspiration pneumonia on antibiotics,  became hypotensive,  was started briefly on midodrine,  blood pressure has improved. Metoprolol resumed.  He is on no antiarrhythmic medications. PT : Home PT. Speech/ swallow : Dysphagia II diet. Palliative care consulted,: No trach, No PEG tube, DNR.  Overnight patient pulled the hemodialysis catheter,  hemostasis was maintained.  As per nephrology it would be reasonable to discuss with patient family about making him comfort care.  Palliative care had a long conversation with patient's brother and care transition to comfort care.  Case management notified.    Assessment & Plan:   Active Problems:   Cardiac arrest ([place])   Failure to thrive in adult   Goals of care, counseling/discussion   DNR (do not resuscitate)   Palliative care by specialist    Terminal care   S/p Cardiac arrest: PEA: Patient was found unresponsive  and was in PEA with cardiac arrest then vtach/vfib.   Patient received 4 minutes of CPR,  developed V. Fib / V. Tach.   He was defibrillated twice,  total duration of resuscitation 10 to 12 minutes. THC and benzos in urine.  Reportedly missed HD.  Initial K 7.4. Could be hyperkalemia, missed HD . Palliative care consulted, Patient is now DNR.  Acute respiratory failure requiring mechanical ventilation He was initially intubated on arrival, he remained in ICU. Continued on his hemodialysis, successfully extubated 9/9. Found to have infiltrate on CT chest consistent with aspiration pneumonia. Continue aspiration precautions,  started on ceftriaxone for 7 days. Patient is on room air 4 L saturating 94%.  Septic shock  : Resolved. Patient became hypotensive requiring midodrine and steroids. which were discontinued. - dc midodrine -DC steroids  Atrial fibrillation : - Cardioverted with amiodarone but became bradycardic, he is now on no antiarrhythmics - Continue telemetry, metoprolol started.  Heart rate controlled.  ESRD on HD-  Continue HD on Tuesday Thursdays and Saturdays. Patient pulled hemodialysis catheter last night hemostasis was maintained. As per nephrology it would be reasonable to discuss with patient family about making him comfort care.   Palliative care had a long conversation with patient's brother and care transition to comfort care.  The decision was made to no longer pursue hemodialysis. Case management notified.  Hepatitis C: continue supportive care.  Severe protein calorie malnutrition: FTT, Patient had 6 hospital  admissions this year. Goals of care -per last discussion on 9/9 -reintubated if needed, no CPR no cardioversion. Goals of care meeting occurred today , Care transitioned to comfort care.    DVT prophylaxis: Heparin Code Status: DNR Family Communication: No one at bed  side. Disposition Plan: Status is: Inpatient  Remains inpatient appropriate because:Hemodynamically unstable   Dispo: The patient is from: Home              Anticipated d/c is to:  Comfort care / residential hospice.              Anticipated d/c date is 1 days              Patient currently is not medically stable to d/c.   Consultants:   PCCM  Procedures:  Antimicrobials: Anti-infectives (From admission, onward)   Start     Dose/Rate Route Frequency Ordered Stop   11/18/19 1200  vancomycin (VANCOCIN) IVPB 750 mg/150 ml premix  Status:  Discontinued        750 mg 150 mL/hr over 60 Minutes Intravenous Every T-Th-Sa (Hemodialysis) 11/16/19 0047 11/16/19 1347   11/16/19 1000  metroNIDAZOLE (FLAGYL) IVPB 500 mg  Status:  Discontinued        500 mg 100 mL/hr over 60 Minutes Intravenous Every 8 hours 11/16/19 0537 11/16/19 1347   11/16/19 0145  vancomycin (VANCOREADY) IVPB 1250 mg/250 mL        1,250 mg 166.7 mL/hr over 90 Minutes Intravenous  Once 11/16/19 0047 11/16/19 0404   11/16/19 0045  metroNIDAZOLE (FLAGYL) IVPB 500 mg  Status:  Discontinued        500 mg 100 mL/hr over 60 Minutes Intravenous Every 8 hours 11/16/19 0033 11/16/19 0537   11/11/19 1000  cefTRIAXone (ROCEPHIN) 2 g in sodium chloride 0.9 % 100 mL IVPB  Status:  Discontinued        2 g 200 mL/hr over 30 Minutes Intravenous Every 24 hours 11/11/19 0914 11/16/19 1347      Subjective: Patient was seen and examined at bedside. Overnight events noted.  Patient pulled hemodialysis catheter,  he was agitated and restless and was given Haldol.  Chest x-ray shows no pneumothorax,  no retained catheter.  Patient was seen in the morning. He was alert,  calm.  Dressing noted over the hemodialysis catheter  Area,  no bleeding noted.  Patient is on 5 L supplemental oxygen saturating 100%.  Objective: Vitals:   11/16/19 0135 11/16/19 0415 11/16/19 0808 11/16/19 1100  BP: (!) 112/94 (!) 142/89 (!) 156/140 128/77  Pulse: 92  99  95  Resp: (!) 22 18 19 18   Temp: 98.2 F (36.8 C) 98 F (36.7 C) (!) 97.4 F (36.3 C) 98.2 F (36.8 C)  TempSrc: Oral Oral Oral Oral  SpO2: 100% 98%  96%  Weight:  61.3 kg    Height:        Intake/Output Summary (Last 24 hours) at 11/16/2019 1511 Last data filed at 11/16/2019 0616 Gross per 24 hour  Intake 240 ml  Output 200 ml  Net 40 ml   Filed Weights   11/15/19 0336 11/15/19 1215 11/16/19 0415  Weight: 63.7 kg 63.2 kg 61.3 kg    Examination:  General exam: Appears calm and comfortable, Alert. Respiratory system: Clear to auscultation. Respiratory effort normal. Dressing noted over HD catheter, No bleeding noted. Cardiovascular system: S1 & S2 heard, RRR. No JVD, murmurs, rubs, gallops or clicks. No pedal edema. Gastrointestinal system: Abdomen is nondistended, soft  and nontender. No organomegaly or masses felt. Normal bowel sounds heard. Central nervous system: Alert and oriented. No focal neurological deficits. Extremities: No edema, No cyanosis, no clubbing. Skin: No rashes, lesions or ulcers Psychiatry: Judgement and insight appear normal. Mood & affect appropriate.     Data Reviewed: I have personally reviewed following labs and imaging studies  CBC: Recent Labs  Lab 11/12/19 0428 11/13/19 0359 11/14/19 0531 11/15/19 0911 11/16/19 0059  WBC 11.0* 15.8* 14.7* 21.2* 11.9*  HGB 10.5* 10.5* 11.8* 12.0* 12.6*  HCT 34.3* 32.7* 37.1* 38.9* 39.3  MCV 93.5 90.1 90.5 92.2 91.4  PLT 51*  48* 54* 48* 49* 55*   Basic Metabolic Panel: Recent Labs  Lab 11/11/19 1708 11/11/19 1708 11/12/19 0428 11/12/19 0428 11/12/19 2307 11/13/19 0359 11/14/19 0531 11/15/19 0911 11/15/19 2215 11/16/19 0059  NA 138   < > 134*   < >  --  138 136 134* 135 136  K 5.0   < > 4.8   < >  --  5.1 4.9 4.9 4.0 4.2  CL 102   < > 98   < >  --  100 99 98 98 98  CO2 23   < > 25   < >  --  27 26 19* 25 26  GLUCOSE 99   < > 242*   < >  --  210* 119* 129* 152* 123*  BUN 48*   < > 28*    < >  --  55* 51* 74* 34* 36*  CREATININE 7.70*   < > 4.61*   < >  --  5.84* 4.99* 6.46* 3.94* 4.16*  CALCIUM 7.3*   < > 7.7*   < >  --  7.6* 8.3* 8.2* 8.1* 8.2*  MG 1.8  --  1.9  --  2.0  --   --   --  1.9 2.0  PHOS 7.6*  --  6.1*  --  5.4*  --   --   --  3.8 4.3   < > = values in this interval not displayed.   GFR: Estimated Creatinine Clearance: 16.2 mL/min (A) (by C-G formula based on SCr of 4.16 mg/dL (H)). Liver Function Tests: Recent Labs  Lab 11/12/19 0428 11/12/19 0428 11/13/19 0359 11/14/19 0531 11/15/19 0911 11/15/19 2215 11/16/19 0059  AST 686*  --  347* 152* 79*  --  60*  ALT 788*  --  621* 461* 327*  --  276*  ALKPHOS 148*  --  216* 220* 228*  --  229*  BILITOT 3.9*  --  2.0* 1.3* 1.0  --  1.3*  PROT 5.2*  --  5.3* 5.6* 6.0*  --  6.2*  ALBUMIN 2.3*   < > 2.3* 2.3* 2.4* 2.4* 2.4*   < > = values in this interval not displayed.   No results for input(s): LIPASE, AMYLASE in the last 168 hours. No results for input(s): AMMONIA in the last 168 hours. Coagulation Profile: Recent Labs  Lab 11/10/19 0000 11/12/19 0428  INR 2.9* 2.5*   Cardiac Enzymes: No results for input(s): CKTOTAL, CKMB, CKMBINDEX, TROPONINI in the last 168 hours. BNP (last 3 results) No results for input(s): PROBNP in the last 8760 hours. HbA1C: No results for input(s): HGBA1C in the last 72 hours. CBG: Recent Labs  Lab 11/16/19 0035 11/16/19 0415 11/16/19 0458 11/16/19 0807 11/16/19 1112  GLUCAP 116* 94 95 138* 142*   Lipid Profile: No results for input(s): CHOL, HDL, LDLCALC, TRIG, CHOLHDL, LDLDIRECT  in the last 72 hours. Thyroid Function Tests: No results for input(s): TSH, T4TOTAL, FREET4, T3FREE, THYROIDAB in the last 72 hours. Anemia Panel: No results for input(s): VITAMINB12, FOLATE, FERRITIN, TIBC, IRON, RETICCTPCT in the last 72 hours. Sepsis Labs: Recent Labs  Lab 11/10/19 0015 11/10/19 0559 11/11/19 0043 11/11/19 0339 11/16/19 0059  PROCALCITON  --   --   --  8.54  3.69  LATICACIDVEN 10.5* 6.7* 2.1*  --   --     Recent Results (from the past 240 hour(s))  SARS Coronavirus 2 by RT PCR (hospital order, performed in [facility] hospital lab) Nasopharyngeal Nasopharyngeal Swab     Status: None   Collection Time: 11/09/19 10:29 PM   Specimen: Nasopharyngeal Swab  Result Value Ref Range Status   SARS Coronavirus 2 NEGATIVE NEGATIVE Final    Comment: (NOTE) SARS-CoV-2 target nucleic acids are NOT DETECTED.  The SARS-CoV-2 RNA is generally detectable in upper and lower respiratory specimens during the acute phase of infection. The lowest concentration of SARS-CoV-2 viral copies this assay can detect is 250 copies / mL. A negative result does not preclude SARS-CoV-2 infection and should not be used as the sole basis for treatment or other patient management decisions.  A negative result may occur with improper specimen collection / handling, submission of specimen other than nasopharyngeal swab, presence of viral mutation(s) within the areas targeted by this assay, and inadequate number of viral copies (<250 copies / mL). A negative result must be combined with clinical observations, patient history, and epidemiological information.  Fact Sheet for Patients:   StrictlyIdeas.no  Fact Sheet for Healthcare Providers: BankingDealers.co.za  This test is not yet approved or  cleared by the [place] FDA and has been authorized for detection and/or diagnosis of SARS-CoV-2 by FDA under an Emergency Use Authorization (EUA).  This EUA will remain in effect (meaning this test can be used) for the duration of the COVID-19 declaration under Section 564(b)(1) of the Act, 21 U.S.C. section 360bbb-3(b)(1), unless the authorization is terminated or revoked sooner.  Performed at [facility], [place] [address], [place], [place] [address]   Blood culture (routine single)     Status: None   Collection Time:  11/10/19 12:05 AM   Specimen: BLOOD  Result Value Ref Range Status   Specimen Description BLOOD LEFT ANTECUBITAL  Final   Special Requests   Final    BOTTLES DRAWN AEROBIC AND ANAEROBIC Blood Culture adequate volume   Culture   Final    NO GROWTH 5 DAYS Performed at [facility], [place] [address] [place], [place] [address]    Report Status 11/15/2019 FINAL  Final  MRSA PCR Screening     Status: None   Collection Time: 11/10/19  5:31 AM   Specimen: Nasal Mucosa; Nasopharyngeal  Result Value Ref Range Status   MRSA by PCR NEGATIVE NEGATIVE Final    Comment:        The GeneXpert MRSA Assay (FDA approved for NASAL specimens only), is one component of a comprehensive MRSA colonization surveillance program. It is not intended to diagnose MRSA infection nor to guide or monitor treatment for MRSA infections. Performed at [facility], [place] [address], [place], [place] [address]   Culture, blood (routine x 2)     Status: None   Collection Time: 11/10/19  5:59 AM   Specimen: BLOOD LEFT HAND  Result Value Ref Range Status   Specimen Description BLOOD LEFT HAND  Final  Special Requests   Final    BOTTLES DRAWN AEROBIC ONLY Blood Culture results may not be optimal due to an inadequate volume of blood received in culture bottles   Culture   Final    NO GROWTH 5 DAYS Performed at [facility], [place] [address], [place], [place] [address]    Report Status 11/15/2019 FINAL  Final  Culture, blood (routine x 2)     Status: None   Collection Time: 11/10/19  6:00 AM   Specimen: BLOOD LEFT HAND  Result Value Ref Range Status   Specimen Description BLOOD LEFT HAND  Final   Special Requests   Final    BOTTLES DRAWN AEROBIC ONLY Blood Culture results may not be optimal due to an inadequate volume of blood received in culture bottles   Culture   Final    NO GROWTH 5 DAYS Performed at [facility], [place] [address] [place], [place] [address]    Report Status 11/15/2019  FINAL  Final  Culture, respiratory (non-expectorated)     Status: None   Collection Time: 11/11/19  9:18 AM   Specimen: Tracheal Aspirate; Respiratory  Result Value Ref Range Status   Specimen Description TRACHEAL ASPIRATE  Final   Special Requests NONE  Final   Gram Stain   Final    MODERATE WBC PRESENT, PREDOMINANTLY PMN ABUNDANT GRAM NEGATIVE RODS    Culture   Final    MODERATE HAEMOPHILUS INFLUENZAE BETA LACTAMASE NEGATIVE Performed at [facility], [place] [address], [place], [place] [address]    Report Status 11/13/2019 FINAL  Final    Radiology Studies: DG Chest 1 View  Result Date: 11/16/2019 CLINICAL DATA:  Hemodialysis. Traumatic removal of tunneled catheter EXAM: CHEST  1 VIEW COMPARISON:  Radiograph 11/15/2019 FINDINGS: Removal of RIGHT central venous catheter.  No pneumothorax. Stable cardiac silhouette. Chronic interstitial lung disease. Posterior thoracic fusion. IMPRESSION: Removal of RIGHT central venous catheter without pneumothorax. No retained catheter. Electronically Signed   By: Suzy Bouchard M.D.   On: 11/16/2019 05:30   CT HEAD WO CONTRAST  Result Date: 11/16/2019 CLINICAL DATA:  Delirium.  Increasing hypoxia. EXAM: CT HEAD WITHOUT CONTRAST TECHNIQUE: Contiguous axial images were obtained from the base of the skull through the vertex without intravenous contrast. COMPARISON:  CT head without contrast 11/09/2019 FINDINGS: Brain: Mild atrophy and white matter changes are stable. The study is mildly degraded by patient motion. No acute infarct, hemorrhage, or mass lesion is present. The ventricles are of normal size. No significant extraaxial fluid collection is present. Vascular: Atherosclerotic calcifications are present within the cavernous internal carotid arteries bilaterally. Calcifications are present at the dural margin of the vertebral arteries. No hyperdense vessel is present. Skull: Skull base is distorted by patient motion. No focal abnormalities are  present. No lytic or blastic lesions. No healing fractures are present. No significant extracranial soft tissue lesion is present. Sinuses/Orbits: Fluid is present in the sphenoid sinuses. Diffuse mucosal thickening and opacification is present posterior ethmoid air cells. This may be related to recent intubation. The globes and orbits are within normal limits. IMPRESSION: 1. Stable atrophy and white matter disease. This likely reflects the sequela of chronic microvascular ischemia. 2. No acute intracranial abnormality or significant interval change. Electronically Signed   By: San Morelle M.D.   On: 11/16/2019 10:44   DG Chest Port 1 View  Result Date: 11/15/2019 CLINICAL DATA:  Hypoxia, pneumonia EXAM: PORTABLE CHEST 1 VIEW COMPARISON:  Portable exam 2029  hours compared to 11/13/2019 FINDINGS: RIGHT jugular central venous catheter with tip projecting over SVC near cavoatrial junction. Borderline enlargement of cardiac silhouette. Mediastinal contours and pulmonary vascularity normal. Patchy BILATERAL pulmonary infiltrates persist, predominantly interstitial, slightly increased in LEFT lung. No pleural effusion or pneumothorax. Prior spinal fixation thoracic spine. Bones demineralized. IMPRESSION: Persistent BILATERAL interstitial infiltrates favoring pneumonia, slightly increased on LEFT since previous exam. Electronically Signed   By: Lavonia Dana M.D.   On: 11/15/2019 20:56    Scheduled Meds: . buprenorphine-naloxone  1 tablet Sublingual Daily  . lidocaine  2 patch Transdermal Q24H   Continuous Infusions:    LOS: 6 days    Time spent: 25 mins.    Shawna Clamp, MD [facility]   If 7PM-7AM, please contact night-coverage

## 2019-11-16 NOTE — Progress Notes (Signed)
I was notified because pt pulled out his tunneled HD cathether. Upon arrival, Ralph Becker was holding manual pressure to site and pt was alert. I held manual pressure to site with gauze dressing until hemastasis achieved. Vaseline gauze pressure dressing applied. Handoff given to Rodman Key RN for primary care.

## 2019-11-17 NOTE — Progress Notes (Signed)
[facility] ([facility]) Hospital Liaison note.     Received request from TOC manager for family interest in [facility]. [facility] is unable to offer a room today. Hospital Liaison will follow up tomorrow or sooner if a room becomes available.     A Please do not hesitate to call with questions.     Thank you,    Mary Anne Robertson, RN, CCM       [facility] Hospital Liaison (listed on [facility] under [facility]osp[facility])     [phone]  

## 2019-11-17 NOTE — Progress Notes (Signed)
PROGRESS NOTE    Ralph Becker  ALP:[id number] DOB: 03/28/1960 DOA: 11/09/2019 PCP: Patient, No Pcp Per   Brief Narrative: 59 years old male with history of end-stage renal disease on hemo dialysisT/T/S, hepatitis C, COPD, hypertension, hyperlipidemia, seizure disorder who was found unresponsive at home, last known normal was 4 mins ago. Patient was found to be in asystole with cardiac arrest.  Patient received 4 minutes of CPR,  developed V. Fib / V. Tach.  He was defibrillated twice,  total duration of resuscitation 10 to 12 minutes.  He was intubated by EMS , Patient was admitted in the ICU and was given Keppra, calcium, insulin, D5W in the ED.  CTA Chest:  no PE, there was some  patchy infiltrates,  started on antibiotic for aspiration pneumonia. Patient was continued on hemodialysis.  Palliative care was consulted. Patient is DNR.  He was successfully extubated 9/9, PCCM pickup 911.  Patient was admitted status post cardiac arrest with PEA then V. Tach/ V. fib could be secondary to missing hemodialysis found to have a potassium of 7.4. continued on hemodialysis,  Success fully extubated 9/9 ,  Also found to have aspiration pneumonia on antibiotics,  became hypotensive,  was started briefly on midodrine,  blood pressure has improved. Metoprolol resumed.  He is on no antiarrhythmic medications. PT : Home PT. Speech/ swallow : Dysphagia II diet. Palliative care consulted,: No trach, No PEG tube, DNR.  Overnight patient pulled the hemodialysis catheter,  hemostasis was maintained.  As per nephrology it would be reasonable to discuss with patient family about making him comfort care.  Palliative care had a long conversation with patient's brother and care transition to comfort care.  Case management notified. Patient is awaiting placement [facility].    Assessment & Plan:   Active Problems:   Cardiac arrest ([place])   Failure to thrive in adult   Goals of care, counseling/discussion   DNR (do not  resuscitate)   Palliative care by specialist   Terminal care   S/p Cardiac arrest: PEA: Patient was found unresponsive  and was in PEA with cardiac arrest then vtach/vfib.   Patient received 4 minutes of CPR,  developed V. Fib / V. Tach.   He was defibrillated twice,  total duration of resuscitation 10 to 12 minutes. THC and benzos in urine.  Reportedly missed HD.  Initial K 7.4. Could be hyperkalemia, missed HD . Palliative care consulted, Patient is now DNR.  Acute respiratory failure requiring mechanical ventilation He was initially intubated on arrival, he remained in ICU. Continued on his hemodialysis, successfully extubated 9/9. Found to have infiltrate on CT chest consistent with aspiration pneumonia. Continue aspiration precautions,  started on ceftriaxone for 7 days. Patient is on room air 4 L saturating 94%.  Septic shock  : Resolved. Patient became hypotensive requiring midodrine and steroids. which were discontinued. - dc midodrine -DC steroids  Atrial fibrillation : - Cardioverted with amiodarone but became bradycardic, he is now on no antiarrhythmics - Continue telemetry, metoprolol started.  Heart rate controlled.  ESRD on HD-  Continue HD on Tuesday Thursdays and Saturdays. Patient pulled hemodialysis catheter last night hemostasis was maintained. As per nephrology it would be reasonable to discuss with patient family about making him comfort care.   Palliative care had a long conversation with patient's brother and care transition to comfort care.   The decision was made to no longer pursue hemodialysis. Case management notified. Patient awaiting bed [facility].  Hepatitis  C: continue supportive care.  Severe protein calorie malnutrition: FTT, Patient had 6 hospital admissions this year. Goals of care -per last discussion on 9/9 -reintubated if needed, no CPR no cardioversion. Goals of care meeting occurred today , Care transitioned to comfort  care.    DVT prophylaxis: Heparin Code Status: DNR Family Communication: No one at bed side. Disposition Plan: Status is: Inpatient  Remains inpatient appropriate because:Hemodynamically unstable   Dispo: The patient is from: Home              Anticipated d/c is to:  Comfort care / residential hospice. [facility] 3/14.              Anticipated d/c date is 1 days              Patient currently is not medically stable to d/c.   Consultants:   PCCM  Procedures:  Antimicrobials: Anti-infectives (From admission, onward)   Start     Dose/Rate Route Frequency Ordered Stop   11/18/19 1200  vancomycin (VANCOCIN) IVPB 750 mg/150 ml premix  Status:  Discontinued        750 mg 150 mL/hr over 60 Minutes Intravenous Every T-Th-Sa (Hemodialysis) 11/16/19 0047 11/16/19 1347   11/16/19 1000  metroNIDAZOLE (FLAGYL) IVPB 500 mg  Status:  Discontinued        500 mg 100 mL/hr over 60 Minutes Intravenous Every 8 hours 11/16/19 0537 11/16/19 1347   11/16/19 0145  vancomycin (VANCOREADY) IVPB 1250 mg/250 mL        1,250 mg 166.7 mL/hr over 90 Minutes Intravenous  Once 11/16/19 0047 11/16/19 0404   11/16/19 0045  metroNIDAZOLE (FLAGYL) IVPB 500 mg  Status:  Discontinued        500 mg 100 mL/hr over 60 Minutes Intravenous Every 8 hours 11/16/19 0033 11/16/19 0537   11/11/19 1000  cefTRIAXone (ROCEPHIN) 2 g in sodium chloride 0.9 % 100 mL IVPB  Status:  Discontinued        2 g 200 mL/hr over 30 Minutes Intravenous Every 24 hours 11/11/19 0914 11/16/19 1347      Subjective: Patient was seen and examined at bedside. Overnight events noted.  Patient pulled IV line, sheets were soaked with blood.  He reports was not able to sleep due to IV line in his arm,   Patient was seen in the morning. He was alert,  Calm but appears sad.  He refused to use oxygen ," states I don't need it". Objective: Vitals:   11/16/19 1100 11/16/19 1940 11/17/19 0548 11/17/19 1139  BP: 128/77 (!) 133/94  (!) 137/99   Pulse: 95 92  99  Resp: 18 17  18   Temp: 98.2 F (36.8 C) 98.6 F (37 C)  98.2 F (36.8 C)  TempSrc: Oral Oral  Oral  SpO2: 96% 97%  90%  Weight:   62.4 kg   Height:        Intake/Output Summary (Last 24 hours) at 11/17/2019 1452 Last data filed at 11/17/2019 1133 Gross per 24 hour  Intake 480 ml  Output 800 ml  Net -320 ml   Filed Weights   11/15/19 1215 11/16/19 0415 11/17/19 0548  Weight: 63.2 kg 61.3 kg 62.4 kg    Examination:  General exam: calm and comfortable, Alert, appears sad.  Respiratory system: Clear to auscultation. Respiratory effort normal. Dressing noted over HD catheter, No bleeding noted. Cardiovascular system: S1 & S2 heard, RRR. No JVD, murmurs, rubs, gallops or clicks. No pedal edema. Gastrointestinal  system: Abdomen is nondistended, soft and nontender. No organomegaly or masses felt. Normal bowel sounds heard. Central nervous system: Alert and oriented. No focal neurological deficits. Extremities:  Bruising noted , No edema, No cyanosis, no clubbing. Skin: No rashes, lesions or ulcers Psychiatry: Judgement and insight appear normal. Mood & affect appropriate.     Data Reviewed: I have personally reviewed following labs and imaging studies  CBC: Recent Labs  Lab 11/12/19 0428 11/13/19 0359 11/14/19 0531 11/15/19 0911 11/16/19 0059  WBC 11.0* 15.8* 14.7* 21.2* 11.9*  HGB 10.5* 10.5* 11.8* 12.0* 12.6*  HCT 34.3* 32.7* 37.1* 38.9* 39.3  MCV 93.5 90.1 90.5 92.2 91.4  PLT 51*  48* 54* 48* 49* 55*   Basic Metabolic Panel: Recent Labs  Lab 11/11/19 1708 11/11/19 1708 11/12/19 0428 11/12/19 0428 11/12/19 2307 11/13/19 0359 11/14/19 0531 11/15/19 0911 11/15/19 2215 11/16/19 0059  NA 138   < > 134*   < >  --  138 136 134* 135 136  K 5.0   < > 4.8   < >  --  5.1 4.9 4.9 4.0 4.2  CL 102   < > 98   < >  --  100 99 98 98 98  CO2 23   < > 25   < >  --  27 26 19* 25 26  GLUCOSE 99   < > 242*   < >  --  210* 119* 129* 152* 123*  BUN 48*   <  > 28*   < >  --  55* 51* 74* 34* 36*  CREATININE 7.70*   < > 4.61*   < >  --  5.84* 4.99* 6.46* 3.94* 4.16*  CALCIUM 7.3*   < > 7.7*   < >  --  7.6* 8.3* 8.2* 8.1* 8.2*  MG 1.8  --  1.9  --  2.0  --   --   --  1.9 2.0  PHOS 7.6*  --  6.1*  --  5.4*  --   --   --  3.8 4.3   < > = values in this interval not displayed.   GFR: Estimated Creatinine Clearance: 16.5 mL/min (A) (by C-G formula based on SCr of 4.16 mg/dL (H)). Liver Function Tests: Recent Labs  Lab 11/12/19 0428 11/12/19 0428 11/13/19 0359 11/14/19 0531 11/15/19 0911 11/15/19 2215 11/16/19 0059  AST 686*  --  347* 152* 79*  --  60*  ALT 788*  --  621* 461* 327*  --  276*  ALKPHOS 148*  --  216* 220* 228*  --  229*  BILITOT 3.9*  --  2.0* 1.3* 1.0  --  1.3*  PROT 5.2*  --  5.3* 5.6* 6.0*  --  6.2*  ALBUMIN 2.3*   < > 2.3* 2.3* 2.4* 2.4* 2.4*   < > = values in this interval not displayed.   No results for input(s): LIPASE, AMYLASE in the last 168 hours. No results for input(s): AMMONIA in the last 168 hours. Coagulation Profile: Recent Labs  Lab 11/12/19 0428  INR 2.5*   Cardiac Enzymes: No results for input(s): CKTOTAL, CKMB, CKMBINDEX, TROPONINI in the last 168 hours. BNP (last 3 results) No results for input(s): PROBNP in the last 8760 hours. HbA1C: No results for input(s): HGBA1C in the last 72 hours. CBG: Recent Labs  Lab 11/16/19 0035 11/16/19 0415 11/16/19 0458 11/16/19 0807 11/16/19 1112  GLUCAP 116* 94 95 138* 142*   Lipid Profile: No results for input(s):  CHOL, HDL, LDLCALC, TRIG, CHOLHDL, LDLDIRECT in the last 72 hours. Thyroid Function Tests: No results for input(s): TSH, T4TOTAL, FREET4, T3FREE, THYROIDAB in the last 72 hours. Anemia Panel: No results for input(s): VITAMINB12, FOLATE, FERRITIN, TIBC, IRON, RETICCTPCT in the last 72 hours. Sepsis Labs: Recent Labs  Lab 11/11/19 0043 11/11/19 0339 11/16/19 0059  PROCALCITON  --  8.54 3.69  LATICACIDVEN 2.1*  --   --     Recent  Results (from the past 240 hour(s))  SARS Coronavirus 2 by RT PCR (hospital order, performed in [facility] hospital lab) Nasopharyngeal Nasopharyngeal Swab     Status: None   Collection Time: 11/09/19 10:29 PM   Specimen: Nasopharyngeal Swab  Result Value Ref Range Status   SARS Coronavirus 2 NEGATIVE NEGATIVE Final    Comment: (NOTE) SARS-CoV-2 target nucleic acids are NOT DETECTED.  The SARS-CoV-2 RNA is generally detectable in upper and lower respiratory specimens during the acute phase of infection. The lowest concentration of SARS-CoV-2 viral copies this assay can detect is 250 copies / mL. A negative result does not preclude SARS-CoV-2 infection and should not be used as the sole basis for treatment or other patient management decisions.  A negative result may occur with improper specimen collection / handling, submission of specimen other than nasopharyngeal swab, presence of viral mutation(s) within the areas targeted by this assay, and inadequate number of viral copies (<250 copies / mL). A negative result must be combined with clinical observations, patient history, and epidemiological information.  Fact Sheet for Patients:   StrictlyIdeas.no  Fact Sheet for Healthcare Providers: BankingDealers.co.za  This test is not yet approved or  cleared by the [place] FDA and has been authorized for detection and/or diagnosis of SARS-CoV-2 by FDA under an Emergency Use Authorization (EUA).  This EUA will remain in effect (meaning this test can be used) for the duration of the COVID-19 declaration under Section 564(b)(1) of the Act, 21 U.S.C. section 360bbb-3(b)(1), unless the authorization is terminated or revoked sooner.  Performed at [facility], [place] [address] [place], [place] [address]   Blood culture (routine single)     Status: None   Collection Time: 11/10/19 12:05 AM   Specimen: BLOOD  Result Value Ref Range  Status   Specimen Description BLOOD LEFT ANTECUBITAL  Final   Special Requests   Final    BOTTLES DRAWN AEROBIC AND ANAEROBIC Blood Culture adequate volume   Culture   Final    NO GROWTH 5 DAYS Performed at [facility], [place] [address], [place], [place] [address]    Report Status 11/15/2019 FINAL  Final  MRSA PCR Screening     Status: None   Collection Time: 11/10/19  5:31 AM   Specimen: Nasal Mucosa; Nasopharyngeal  Result Value Ref Range Status   MRSA by PCR NEGATIVE NEGATIVE Final    Comment:        The GeneXpert MRSA Assay (FDA approved for NASAL specimens only), is one component of a comprehensive MRSA colonization surveillance program. It is not intended to diagnose MRSA infection nor to guide or monitor treatment for MRSA infections. Performed at [facility], [place] [address] [place], [place] [address]   Culture, blood (routine x 2)     Status: None   Collection Time: 11/10/19  5:59 AM   Specimen: BLOOD LEFT HAND  Result Value Ref Range Status   Specimen Description BLOOD LEFT HAND  Final   Special Requests   Final  BOTTLES DRAWN AEROBIC ONLY Blood Culture results may not be optimal due to an inadequate volume of blood received in culture bottles   Culture   Final    NO GROWTH 5 DAYS Performed at [facility], [place] [address] [place], [place] [address]    Report Status 11/15/2019 FINAL  Final  Culture, blood (routine x 2)     Status: None   Collection Time: 11/10/19  6:00 AM   Specimen: BLOOD LEFT HAND  Result Value Ref Range Status   Specimen Description BLOOD LEFT HAND  Final   Special Requests   Final    BOTTLES DRAWN AEROBIC ONLY Blood Culture results may not be optimal due to an inadequate volume of blood received in culture bottles   Culture   Final    NO GROWTH 5 DAYS Performed at [facility], [place] [address], [place], [place] [address]    Report Status 11/15/2019 FINAL  Final  Culture, respiratory (non-expectorated)      Status: None   Collection Time: 11/11/19  9:18 AM   Specimen: Tracheal Aspirate; Respiratory  Result Value Ref Range Status   Specimen Description TRACHEAL ASPIRATE  Final   Special Requests NONE  Final   Gram Stain   Final    MODERATE WBC PRESENT, PREDOMINANTLY PMN ABUNDANT GRAM NEGATIVE RODS    Culture   Final    MODERATE HAEMOPHILUS INFLUENZAE BETA LACTAMASE NEGATIVE Performed at [facility], [place] [address] [place], [place] [address]    Report Status 11/13/2019 FINAL  Final  Culture, blood (routine x 2)     Status: None (Preliminary result)   Collection Time: 11/16/19 12:55 AM   Specimen: BLOOD RIGHT ARM  Result Value Ref Range Status   Specimen Description BLOOD RIGHT ARM  Final   Special Requests   Final    BOTTLES DRAWN AEROBIC AND ANAEROBIC Blood Culture adequate volume   Culture   Final    NO GROWTH < 24 HOURS Performed at [facility], [place] [address], [place], [place] [address]    Report Status PENDING  Incomplete  Culture, blood (routine x 2)     Status: None (Preliminary result)   Collection Time: 11/16/19 12:58 AM   Specimen: BLOOD RIGHT HAND  Result Value Ref Range Status   Specimen Description BLOOD RIGHT HAND  Final   Special Requests   Final    BOTTLES DRAWN AEROBIC ONLY Blood Culture adequate volume   Culture   Final    NO GROWTH < 24 HOURS Performed at [facility], [place] [address] [place], [place] [address]    Report Status PENDING  Incomplete    Radiology Studies: DG Chest 1 View  Result Date: 11/16/2019 CLINICAL DATA:  Hemodialysis. Traumatic removal of tunneled catheter EXAM: CHEST  1 VIEW COMPARISON:  Radiograph 11/15/2019 FINDINGS: Removal of RIGHT central venous catheter.  No pneumothorax. Stable cardiac silhouette. Chronic interstitial lung disease. Posterior thoracic fusion. IMPRESSION: Removal of RIGHT central venous catheter without pneumothorax. No retained catheter. Electronically Signed   By: Suzy Bouchard M.D.   On:  11/16/2019 05:30   CT HEAD WO CONTRAST  Result Date: 11/16/2019 CLINICAL DATA:  Delirium.  Increasing hypoxia. EXAM: CT HEAD WITHOUT CONTRAST TECHNIQUE: Contiguous axial images were obtained from the base of the skull through the vertex without intravenous contrast. COMPARISON:  CT head without contrast 11/09/2019 FINDINGS: Brain: Mild atrophy and white matter changes are stable. The study is mildly degraded by  patient motion. No acute infarct, hemorrhage, or mass lesion is present. The ventricles are of normal size. No significant extraaxial fluid collection is present. Vascular: Atherosclerotic calcifications are present within the cavernous internal carotid arteries bilaterally. Calcifications are present at the dural margin of the vertebral arteries. No hyperdense vessel is present. Skull: Skull base is distorted by patient motion. No focal abnormalities are present. No lytic or blastic lesions. No healing fractures are present. No significant extracranial soft tissue lesion is present. Sinuses/Orbits: Fluid is present in the sphenoid sinuses. Diffuse mucosal thickening and opacification is present posterior ethmoid air cells. This may be related to recent intubation. The globes and orbits are within normal limits. IMPRESSION: 1. Stable atrophy and white matter disease. This likely reflects the sequela of chronic microvascular ischemia. 2. No acute intracranial abnormality or significant interval change. Electronically Signed   By: San Morelle M.D.   On: 11/16/2019 10:44   DG Chest Port 1 View  Result Date: 11/15/2019 CLINICAL DATA:  Hypoxia, pneumonia EXAM: PORTABLE CHEST 1 VIEW COMPARISON:  Portable exam 2029 hours compared to 11/13/2019 FINDINGS: RIGHT jugular central venous catheter with tip projecting over SVC near cavoatrial junction. Borderline enlargement of cardiac silhouette. Mediastinal contours and pulmonary vascularity normal. Patchy BILATERAL pulmonary infiltrates persist,  predominantly interstitial, slightly increased in LEFT lung. No pleural effusion or pneumothorax. Prior spinal fixation thoracic spine. Bones demineralized. IMPRESSION: Persistent BILATERAL interstitial infiltrates favoring pneumonia, slightly increased on LEFT since previous exam. Electronically Signed   By: Lavonia Dana M.D.   On: 11/15/2019 20:56    Scheduled Meds: . buprenorphine-naloxone  1 tablet Sublingual Daily  . lidocaine  2 patch Transdermal Q24H   Continuous Infusions:    LOS: 7 days    Time spent: 25 mins.    Shawna Clamp, MD [facility]   If 7PM-7AM, please contact night-coverage

## 2019-11-17 NOTE — Progress Notes (Signed)
Nutrition Brief Note  Chart reviewed. Pt now transitioning to comfort care.  No further nutrition interventions warranted at this time.  Please re-consult as needed.   Gabriana Wilmott W, RD, LDN, CDCES Registered Dietitian II Certified Diabetes Care and Education Specialist Please refer to [facility] for RD and/or RD on-call/weekend/after hours pager  

## 2019-11-17 NOTE — Progress Notes (Signed)
°   11/17/19 1255  Clinical Encounter Type  Visited With Patient  Visit Type Initial;Social support  Referral From Patient  Consult/Referral To Palliative care  Stress Factors  Patient Stress Factors Loss of control  This chaplain responded to PMT consult for spiritual care.  The chaplain listened as the Pt. focused on and often returned to the topic of how he was going to get home.  The Pt. described his home and the property in [place]. The Pt. stated he has a girl friend Ralph Becker who leaves with him.  The Pt. was not aware of how to contact [place]. The Pt. shared his brother Ralph Becker in [place] is the only family member he has contact with.  The Pt. is surprised to see he has not eaten his lunch and proceeds to sit up to eat. The Pt. accepted F/U spiritual from the chaplain.

## 2019-11-18 MED ORDER — OXYCODONE HCL 5 MG PO TABS
2.5000 mg | ORAL_TABLET | Freq: Once | ORAL | Status: AC
  Administered 2019-11-18: 2.5 mg via ORAL
  Filled 2019-11-18: qty 1

## 2019-11-18 MED ORDER — HYDROMORPHONE HCL 1 MG/ML PO LIQD
1.0000 mg | ORAL | Status: DC | PRN
  Administered 2019-11-19 – 2019-11-20 (×12): 1 mg via ORAL
  Filled 2019-11-18 (×12): qty 1

## 2019-11-18 MED ORDER — IBUPROFEN 600 MG PO TABS
600.0000 mg | ORAL_TABLET | Freq: Once | ORAL | Status: AC
  Administered 2019-11-18: 600 mg via ORAL
  Filled 2019-11-18: qty 1

## 2019-11-18 MED ORDER — HYDROMORPHONE HCL 2 MG PO TABS
1.0000 mg | ORAL_TABLET | ORAL | Status: DC | PRN
  Administered 2019-11-18 – 2019-11-19 (×4): 1 mg via ORAL
  Filled 2019-11-18 (×4): qty 1

## 2019-11-18 NOTE — Progress Notes (Signed)
Palliative:  HPI: 59 y.o. male  with past medical history of ESRD on dialysis TTS, hepatitis C, COPD, HTN, HLD, seizures, hepatic steatosis, degenerative disc disease, nicotine dependence, severe malnutrition with failure to thrive. Recent admission in June [facility] with COPD exacerbation requiring intubation and dialysis initiated. Admitted on 11/09/2019 with cardiac arrest at home and total time resuscitated 10-12 minutes with PEA and then VT/VF. Complicated by aspiration pneumonitis vs pneumonia, RV failure, shock liver.    I met today with Ralph Becker. He is complaining of chest pain today but he is sitting up and eating his lunch. No distress and breathing regular and unlabored.   There is some confusion about goals of care today. I spoke further with Ralph Becker. Ralph Becker can tell me the year and he tells me after thinking for some time that he is in [place] and eventually tells me [facility] hospital. He is unable to tell me about any of his health conditions but can only tell me about his suboxone. He tells me that he is in the hospital because he has probably taken too much suboxone over the years. He does not recall ever having dialysis and cannot tell me what dialysis is.   He tells me that he wants to live as long as he can. I gently reminded him of current hospital events, acute illness, and that he is very ill with conditions that we cannot reverse. I advised him that we have had conversations with he and his family about recommendations that we do not place him back on life support for a third time as we do not feel this would help him but only cause him more pain and suffering. We also discussed that dialysis is not recommended and that he had removed his dialysis catheter which is very dangerous. (He is a poor candidate with ongoing confusion and mental state). I told him about the recommendation to continue supporting him the best we can but also to provide him comfort and not pursue CPR, intubation again. He  agrees with this approach. He tells me that he just wants to go home. I discussed with him return home with hospice support to follow with him there and he agrees this would be a good plan for him. He also again tells me that he trusts his brother, Ralph Becker (Ralph Becker), and that Ralph Becker has tried to help him over the years and he should have accepted his help.   I called and spoke with Ralph Becker with social worker present. Ralph Becker agrees with plan for home with hospice care. I called and spoke with Ralph Becker and she also wants him to return home. She is anxious about him being there and wants optimal support and presence from hospice. However, her daughter is currently + COVID and they are both under quarantine. I will call her back tomorrow when she is home and able to share with me the date they will be off quarantine.   All questions/concerns addressed. Emotional support provided. Discussed with CSW, CMRN, Dr. Dwyane Dee.   Exam: Poor historian. Very forgetful. Poor memory. No distress. Breathing regular, unlabored. Abd flat. Moving all extremities.   Plan: - Home with hospice.  - Recommend avoiding multiple conversations with Ralph Becker regarding decisions as he is very forgetful and does not recall conversations from day to day or his medical condition and expectations. This only causes him more distress. His brother is his surrogate Media planner but Ralph Becker can participate but poor insight and judgement for complex decision making.  [place], NP Palliative Medicine Team Pager [phone] (Please see [email or web address] for schedule) Team Phone [phone]    Greater than 50%  of this time was spent counseling and coordinating care related to the above assessment and plan

## 2019-11-18 NOTE — Progress Notes (Signed)
Patient still complaining of chest pain. Received order for 1 time dose of oxycodone 2.5mg . Will continue to monitor.

## 2019-11-18 NOTE — Progress Notes (Signed)
Manufacturing engineer [facility]) Hospital Liaison Note:  Update on [place] referral made for this patient on 11/17/19:  Unfortunately, [facility] has no bed to offer today. [facility] will continue to follow patient and update on bed availability daily or sooner if bed becomes available at [facility].  Left message to update TOC ([place]).  Please call with any hospice related questions or concerns,  Gar Ponto, RN [facility] (on [place]) [phone]

## 2019-11-18 NOTE — Plan of Care (Signed)
  Problem: Pain Managment: Goal: General experience of comfort will improve Outcome: Progressing   

## 2019-11-18 NOTE — Plan of Care (Signed)
°  Problem: Coping: °Goal: Level of anxiety will decrease °Outcome: Progressing °  °

## 2019-11-18 NOTE — [place] Notes (Signed)
Patient c/o chest pain near bruise from prior CPR. Tylenol is not due yet is asking for more pain medication. Dr. Dwyane Dee paged since patient does not want IV access for IV medications. Received new orders. Will continue to monitor.

## 2019-11-18 NOTE — Social Work (Addendum)
UPDATE: CSW spoke with patient who expressed that he does not want to go down the road of Hospice at this time. We also discussed his code status and he stated he is still interested in life saving interventions. Patient stated he does have help at home to complete home health pt. CSW messaged doctor and reached out to palliative team at doctors request.  Received a call from Gar Ponto stating that no beds were available at [facility] but there may be a bed available at [facility]. Called brother Mckennon Zwart, he stated that [place] is  is fine, called Gar Ponto back, had to leave a message for her.

## 2019-11-18 NOTE — [place] Notes (Signed)
PROGRESS NOTE    Ralph Becker  RCV:[id number] DOB: 1960/03/29 DOA: 11/09/2019 PCP: Patient, No Pcp Per   Brief Narrative: 59 years old male with history of end-stage renal disease on hemo dialysisT/T/S, hepatitis C, COPD, hypertension, hyperlipidemia, seizure disorder who was found unresponsive at home, last known normal was 4 mins ago. Patient was found to be in asystole with cardiac arrest.  Patient received 4 minutes of CPR,  developed V. Fib / V. Tach.  He was defibrillated twice,  total duration of resuscitation 10 to 12 minutes.  He was intubated by EMS , Patient was admitted in the ICU and was given Keppra, calcium, insulin, D5W in the ED.  CTA Chest:  no PE, there was some  patchy infiltrates,  started on antibiotic for aspiration pneumonia. Patient was continued on hemodialysis.  Palliative care was consulted. Patient is DNR.  He was successfully extubated 9/9, PCCM pickup 911.  Patient was admitted status post cardiac arrest with PEA then V. Tach/ V. fib could be secondary to missing hemodialysis found to have a potassium of 7.4. continued on hemodialysis,  Success fully extubated 9/9 ,  Also found to have aspiration pneumonia on antibiotics,  became hypotensive,  was started briefly on midodrine,  blood pressure has improved. Metoprolol resumed.  He is on no antiarrhythmic medications. PT : Home PT. Speech/ swallow : Dysphagia II diet. Palliative care consulted,: No trach, No PEG tube, DNR.  On 9/12 overnight  patient pulled the hemodialysis catheter,  hemostasis was maintained.  As per nephrology it would be reasonable to discuss with patient family about making him comfort care.  Palliative care had a long conversation with patient's brother and care transition to comfort care.  Case management notified. Patient is awaiting placement [facility]. Now patient wants to go home with hospice following.    Assessment & Plan:   Active Problems:   Cardiac arrest ([place])   Failure to thrive in  adult   Goals of care, counseling/discussion   DNR (do not resuscitate)   Palliative care by specialist   Terminal care   S/p Cardiac arrest: PEA: Patient was found unresponsive  and was in PEA with cardiac arrest then vtach/vfib.   Patient received 4 minutes of CPR,  developed V. Fib / V. Tach.   He was defibrillated twice,  total duration of resuscitation 10 to 12 minutes. THC and benzos in urine.  Reportedly missed HD.  Initial K 7.4. Could be hyperkalemia, missed HD . Palliative care consulted, Patient is now DNR/ comfort care.  Acute respiratory failure requiring mechanical ventilation He was initially intubated on arrival, he remained in ICU. Continued on his hemodialysis, successfully extubated 9/9. Found to have infiltrate on CT chest consistent with aspiration pneumonia. Continue aspiration precautions,  started on ceftriaxone for 7 days. Patient is on room air 4 L saturating 94%.  Septic shock  : Resolved. Patient became hypotensive requiring midodrine and steroids. which were discontinued. - dc midodrine -DC steroids  Atrial fibrillation : - Cardioverted with amiodarone but became bradycardic, he is now on no antiarrhythmics - Continue telemetry, metoprolol started.  Heart rate controlled.  ESRD on HD-  Continue HD on Tuesday Thursdays and Saturdays. Patient pulled hemodialysis catheter 9/12, hemostasis was maintained. As per nephrology it would be reasonable to discuss with patient family about making him comfort care.   Palliative care had a long conversation with patient's brother and care transition to comfort care.   The decision was made to no longer  pursue hemodialysis. Case management notified. Patient awaiting bed [facility]. Patient wants to go Home with hospice following.  Hepatitis C: continue supportive care.  Severe protein calorie malnutrition: FTT, Patient had 6 hospital admissions this year. Goals of care -per last discussion on 9/9  -reintubated if needed, no CPR no cardioversion. Goals of care meeting occurred today , Care transitioned to comfort care.    DVT prophylaxis: Heparin Code Status: DNR Family Communication:  Brother was informed. Disposition Plan: Status is: Inpatient  Remains inpatient appropriate because:Hemodynamically unstable   Dispo: The patient is from: Home              Anticipated d/c is to:  Comfort care / residential hospice.               Anticipated d/c date is 1 days              Patient currently is not medically stable to d/c.   Consultants:   PCCM  Procedures:  Antimicrobials: Anti-infectives (From admission, onward)   Start     Dose/Rate Route Frequency Ordered Stop   11/18/19 1200  vancomycin (VANCOCIN) IVPB 750 mg/150 ml premix  Status:  Discontinued        750 mg 150 mL/hr over 60 Minutes Intravenous Every T-Th-Sa (Hemodialysis) 11/16/19 0047 11/16/19 1347   11/16/19 1000  metroNIDAZOLE (FLAGYL) IVPB 500 mg  Status:  Discontinued        500 mg 100 mL/hr over 60 Minutes Intravenous Every 8 hours 11/16/19 0537 11/16/19 1347   11/16/19 0145  vancomycin (VANCOREADY) IVPB 1250 mg/250 mL        1,250 mg 166.7 mL/hr over 90 Minutes Intravenous  Once 11/16/19 0047 11/16/19 0404   11/16/19 0045  metroNIDAZOLE (FLAGYL) IVPB 500 mg  Status:  Discontinued        500 mg 100 mL/hr over 60 Minutes Intravenous Every 8 hours 11/16/19 0033 11/16/19 0537   11/11/19 1000  cefTRIAXone (ROCEPHIN) 2 g in sodium chloride 0.9 % 100 mL IVPB  Status:  Discontinued        2 g 200 mL/hr over 30 Minutes Intravenous Every 24 hours 11/11/19 0914 11/16/19 1347      Subjective: Patient was seen and examined at bedside. Overnight events noted.  Patient appears calm and comfortable. He complains of chest soreness,  denies any shortness of breath.  He states that he wants to go home.   Objective: Vitals:   11/16/19 1940 11/17/19 0548 11/17/19 1139 11/18/19 1227  BP: (!) 133/94  (!) 137/99 112/76    Pulse: 92  99 83  Resp: 17  18 16   Temp: 98.6 F (37 C)  98.2 F (36.8 C) (!) 97.3 F (36.3 C)  TempSrc: Oral  Oral Oral  SpO2: 97%  90%   Weight:  62.4 kg    Height:        Intake/Output Summary (Last 24 hours) at 11/18/2019 1605 Last data filed at 11/18/2019 1402 Gross per 24 hour  Intake 440 ml  Output 875 ml  Net -435 ml   Filed Weights   11/15/19 1215 11/16/19 0415 11/17/19 0548  Weight: 63.2 kg 61.3 kg 62.4 kg    Examination:  General exam: calm and comfortable, Alert, appears sad.  Respiratory system: Clear to auscultation. Respiratory effort normal. Dressing noted over HD catheter, No bleeding noted. Cardiovascular system: S1 & S2 heard, RRR. No JVD, murmurs, rubs, gallops or clicks. No pedal edema. Gastrointestinal system: Abdomen is nondistended, soft  and nontender. No organomegaly or masses felt. Normal bowel sounds heard. Central nervous system: Alert and oriented. No focal neurological deficits. Extremities:  Bruising noted , No edema, No cyanosis, no clubbing. Skin: No rashes, lesions or ulcers Psychiatry: Judgement and insight appear normal. Mood & affect appropriate.     Data Reviewed: I have personally reviewed following labs and imaging studies  CBC: Recent Labs  Lab 11/12/19 0428 11/13/19 0359 11/14/19 0531 11/15/19 0911 11/16/19 0059  WBC 11.0* 15.8* 14.7* 21.2* 11.9*  HGB 10.5* 10.5* 11.8* 12.0* 12.6*  HCT 34.3* 32.7* 37.1* 38.9* 39.3  MCV 93.5 90.1 90.5 92.2 91.4  PLT 51*  48* 54* 48* 49* 55*   Basic Metabolic Panel: Recent Labs  Lab 11/11/19 1708 11/11/19 1708 11/12/19 0428 11/12/19 0428 11/12/19 2307 11/13/19 0359 11/14/19 0531 11/15/19 0911 11/15/19 2215 11/16/19 0059  NA 138   < > 134*   < >  --  138 136 134* 135 136  K 5.0   < > 4.8   < >  --  5.1 4.9 4.9 4.0 4.2  CL 102   < > 98   < >  --  100 99 98 98 98  CO2 23   < > 25   < >  --  27 26 19* 25 26  GLUCOSE 99   < > 242*   < >  --  210* 119* 129* 152* 123*  BUN 48*    < > 28*   < >  --  55* 51* 74* 34* 36*  CREATININE 7.70*   < > 4.61*   < >  --  5.84* 4.99* 6.46* 3.94* 4.16*  CALCIUM 7.3*   < > 7.7*   < >  --  7.6* 8.3* 8.2* 8.1* 8.2*  MG 1.8  --  1.9  --  2.0  --   --   --  1.9 2.0  PHOS 7.6*  --  6.1*  --  5.4*  --   --   --  3.8 4.3   < > = values in this interval not displayed.   GFR: Estimated Creatinine Clearance: 16.5 mL/min (A) (by C-G formula based on SCr of 4.16 mg/dL (H)). Liver Function Tests: Recent Labs  Lab 11/12/19 0428 11/12/19 0428 11/13/19 0359 11/14/19 0531 11/15/19 0911 11/15/19 2215 11/16/19 0059  AST 686*  --  347* 152* 79*  --  60*  ALT 788*  --  621* 461* 327*  --  276*  ALKPHOS 148*  --  216* 220* 228*  --  229*  BILITOT 3.9*  --  2.0* 1.3* 1.0  --  1.3*  PROT 5.2*  --  5.3* 5.6* 6.0*  --  6.2*  ALBUMIN 2.3*   < > 2.3* 2.3* 2.4* 2.4* 2.4*   < > = values in this interval not displayed.   No results for input(s): LIPASE, AMYLASE in the last 168 hours. No results for input(s): AMMONIA in the last 168 hours. Coagulation Profile: Recent Labs  Lab 11/12/19 0428  INR 2.5*   Cardiac Enzymes: No results for input(s): CKTOTAL, CKMB, CKMBINDEX, TROPONINI in the last 168 hours. BNP (last 3 results) No results for input(s): PROBNP in the last 8760 hours. HbA1C: No results for input(s): HGBA1C in the last 72 hours. CBG: Recent Labs  Lab 11/16/19 0035 11/16/19 0415 11/16/19 0458 11/16/19 0807 11/16/19 1112  GLUCAP 116* 94 95 138* 142*   Lipid Profile: No results for input(s): CHOL, HDL, LDLCALC, TRIG, CHOLHDL,  LDLDIRECT in the last 72 hours. Thyroid Function Tests: No results for input(s): TSH, T4TOTAL, FREET4, T3FREE, THYROIDAB in the last 72 hours. Anemia Panel: No results for input(s): VITAMINB12, FOLATE, FERRITIN, TIBC, IRON, RETICCTPCT in the last 72 hours. Sepsis Labs: Recent Labs  Lab 11/16/19 0059  PROCALCITON 3.69    Recent Results (from the past 240 hour(s))  SARS Coronavirus 2 by RT PCR  (hospital order, performed in [facility] hospital lab) Nasopharyngeal Nasopharyngeal Swab     Status: None   Collection Time: 11/09/19 10:29 PM   Specimen: Nasopharyngeal Swab  Result Value Ref Range Status   SARS Coronavirus 2 NEGATIVE NEGATIVE Final    Comment: (NOTE) SARS-CoV-2 target nucleic acids are NOT DETECTED.  The SARS-CoV-2 RNA is generally detectable in upper and lower respiratory specimens during the acute phase of infection. The lowest concentration of SARS-CoV-2 viral copies this assay can detect is 250 copies / mL. A negative result does not preclude SARS-CoV-2 infection and should not be used as the sole basis for treatment or other patient management decisions.  A negative result may occur with improper specimen collection / handling, submission of specimen other than nasopharyngeal swab, presence of viral mutation(s) within the areas targeted by this assay, and inadequate number of viral copies (<250 copies / mL). A negative result must be combined with clinical observations, patient history, and epidemiological information.  Fact Sheet for Patients:   StrictlyIdeas.no  Fact Sheet for Healthcare Providers: BankingDealers.co.za  This test is not yet approved or  cleared by the [place] FDA and has been authorized for detection and/or diagnosis of SARS-CoV-2 by FDA under an Emergency Use Authorization (EUA).  This EUA will remain in effect (meaning this test can be used) for the duration of the COVID-19 declaration under Section 564(b)(1) of the Act, 21 U.S.C. section 360bbb-3(b)(1), unless the authorization is terminated or revoked sooner.  Performed at [facility], [place] [address] [place], [place] [address]   Blood culture (routine single)     Status: None   Collection Time: 11/10/19 12:05 AM   Specimen: BLOOD  Result Value Ref Range Status   Specimen Description BLOOD LEFT ANTECUBITAL  Final    Special Requests   Final    BOTTLES DRAWN AEROBIC AND ANAEROBIC Blood Culture adequate volume   Culture   Final    NO GROWTH 5 DAYS Performed at [facility], [place] [address], [place], [place] [address]    Report Status 11/15/2019 FINAL  Final  MRSA PCR Screening     Status: None   Collection Time: 11/10/19  5:31 AM   Specimen: Nasal Mucosa; Nasopharyngeal  Result Value Ref Range Status   MRSA by PCR NEGATIVE NEGATIVE Final    Comment:        The GeneXpert MRSA Assay (FDA approved for NASAL specimens only), is one component of a comprehensive MRSA colonization surveillance program. It is not intended to diagnose MRSA infection nor to guide or monitor treatment for MRSA infections. Performed at [facility], [place] [address] [place], [place] [address]   Culture, blood (routine x 2)     Status: None   Collection Time: 11/10/19  5:59 AM   Specimen: BLOOD LEFT HAND  Result Value Ref Range Status   Specimen Description BLOOD LEFT HAND  Final   Special Requests   Final    BOTTLES DRAWN AEROBIC ONLY Blood Culture results may not be optimal due to an inadequate volume of blood received  in culture bottles   Culture   Final    NO GROWTH 5 DAYS Performed at [facility], [place] [address] [place], [place] [address]    Report Status 11/15/2019 FINAL  Final  Culture, blood (routine x 2)     Status: None   Collection Time: 11/10/19  6:00 AM   Specimen: BLOOD LEFT HAND  Result Value Ref Range Status   Specimen Description BLOOD LEFT HAND  Final   Special Requests   Final    BOTTLES DRAWN AEROBIC ONLY Blood Culture results may not be optimal due to an inadequate volume of blood received in culture bottles   Culture   Final    NO GROWTH 5 DAYS Performed at [facility], [place] [address] [place], [place] [address]    Report Status 11/15/2019 FINAL  Final  Culture, respiratory (non-expectorated)     Status: None   Collection Time: 11/11/19  9:18 AM   Specimen:  Tracheal Aspirate; Respiratory  Result Value Ref Range Status   Specimen Description TRACHEAL ASPIRATE  Final   Special Requests NONE  Final   Gram Stain   Final    MODERATE WBC PRESENT, PREDOMINANTLY PMN ABUNDANT GRAM NEGATIVE RODS    Culture   Final    MODERATE HAEMOPHILUS INFLUENZAE BETA LACTAMASE NEGATIVE Performed at [facility], [place] [address] [place], [place] [address]    Report Status 11/13/2019 FINAL  Final  Culture, blood (routine x 2)     Status: None (Preliminary result)   Collection Time: 11/16/19 12:55 AM   Specimen: BLOOD RIGHT ARM  Result Value Ref Range Status   Specimen Description BLOOD RIGHT ARM  Final   Special Requests   Final    BOTTLES DRAWN AEROBIC AND ANAEROBIC Blood Culture adequate volume   Culture   Final    NO GROWTH 2 DAYS Performed at [facility], [place] [address], [place], [place] [address]    Report Status PENDING  Incomplete  Culture, blood (routine x 2)     Status: None (Preliminary result)   Collection Time: 11/16/19 12:58 AM   Specimen: BLOOD RIGHT HAND  Result Value Ref Range Status   Specimen Description BLOOD RIGHT HAND  Final   Special Requests   Final    BOTTLES DRAWN AEROBIC ONLY Blood Culture adequate volume   Culture   Final    NO GROWTH 2 DAYS Performed at [facility], [place] [address] [place], [place] [address]    Report Status PENDING  Incomplete    Radiology Studies: No results found.  Scheduled Meds: . buprenorphine-naloxone  1 tablet Sublingual Daily  . lidocaine  2 patch Transdermal Q24H   Continuous Infusions:    LOS: 8 days    Time spent: 25 mins.    Shawna Clamp, MD [facility]   If 7PM-7AM, please contact night-coverage

## 2019-11-19 ENCOUNTER — Other Ambulatory Visit: Payer: Self-pay

## 2019-11-19 DIAGNOSIS — T8241XS Breakdown (mechanical) of vascular dialysis catheter, sequela: Secondary | ICD-10-CM

## 2019-11-19 NOTE — Progress Notes (Signed)
[facility]  PROGRESS NOTE  Ralph Becker WER:[id number] DOB: 11-May-1960 DOA: 11/09/2019 PCP: Patient, No Pcp Per   Brief HPI:   59 year old male with history of ESRD on HD TTS, hepatitis C, COPD, hypertension, hyperlipidemia, seizure disorder who was found unresponsive at home.  Patient was found to be in asystole with cardiac arrest.  He received 4 minutes of CPR.  He developed V. fib/V. tach and was defibrillated twice.  Total duration of resuscitation was 10 to 12 minutes.  He was intubated by EMS and was admitted to ICU.  He was successfully extubated on 11/13/2019.  TRH assumed care on 11/15/2019. Palliative care was consulted, and recommended no tracheostomy, no PEG tube, DNR.  Palliative care had long.  On 11/16/2019 patient pulled out hemodialysis catheter.  Nephrology recommended to discuss with palliative care regarding making him comfort measures only.  Palliative care discussed with patient's brother and at this time recommended transition to comfort care only.    Subjective   Patient seen and examined, no new complaints.  Awaiting to go to hospice facility.   Assessment/Plan:     1. S/p cardiac arrest/PEA-patient was found unresponsive and was in PEA with cardiac arrest, V. tach/V. fib.  He received 4 minutes of CPR, developed V. fib/V. tach and was defibrillated twice.  Total resuscitation time was 10 to 12 minutes.  Palliative care was consulted.  Patient is DNR/comfort care.  Hemodialysis has been stopped. 2. ESRD on HD-patient was started on hemodialysis TT S.  He pulled hemodialysis catheter 11/16/2019.  Hemostasis was maintained.  As per nephrology it would be reasonable to discuss with patient family regarding making him comfort care and stopping hemodialysis.  Palliative care had long discussion with patient's brother and this time but has decided to transition to comfort care only.  No further hemodialysis.  Patient will be going to residential hospice.  Awaiting bed at  [facility]. 3. Acute respiratory failure requiring mechanical ventilation-he was initially intubated on arrival, remains in ICU.  He was successfully extubated on 11/13/2019.  He was found to have infiltrate on CT chest consistent with aspiration pneumonia.  Continue aspiration precautions.  Started on ceftriaxone for 7 days.   4. Septic shock-resolved, patient became hypotensive requiring midodrine and steroids.  This was discontinued. 5. Atrial fibrillation-cardioverted with amiodarone but became bradycardic.  He is currently not on antiarrhythmics.  Continue metoprolol. 6. Goals of care-palliative care was consulted, patient is DNR, transition to comfort care only.  Hemodialysis has been discontinued.     COVID-19 Labs  No results for input(s): DDIMER, FERRITIN, LDH, CRP in the last 72 hours.  Lab Results  Component Value Date   SARSCOV2NAA NEGATIVE 11/09/2019   [place] NEGATIVE 09/08/2019     Scheduled medications:   . buprenorphine-naloxone  1 tablet Sublingual Daily  . lidocaine  2 patch Transdermal Q24H         CBG: Recent Labs  Lab 11/16/19 0035 11/16/19 0415 11/16/19 0458 11/16/19 0807 11/16/19 1112  GLUCAP 116* 94 95 138* 142*    SpO2: 91 % O2 Flow Rate (L/min): 7 L/min FiO2 (%): 40 %    CBC: Recent Labs  Lab 11/13/19 0359 11/14/19 0531 11/15/19 0911 11/16/19 0059  WBC 15.8* 14.7* 21.2* 11.9*  HGB 10.5* 11.8* 12.0* 12.6*  HCT 32.7* 37.1* 38.9* 39.3  MCV 90.1 90.5 92.2 91.4  PLT 54* 48* 49* 55*    Basic Metabolic Panel: Recent Labs  Lab 11/12/19 2307 11/13/19 0359 11/14/19 0531 11/15/19 0911 11/15/19  2215 11/16/19 0059  NA  --  138 136 134* 135 136  K  --  5.1 4.9 4.9 4.0 4.2  CL  --  100 99 98 98 98  CO2  --  27 26 19* 25 26  GLUCOSE  --  210* 119* 129* 152* 123*  BUN  --  55* 51* 74* 34* 36*  CREATININE  --  5.84* 4.99* 6.46* 3.94* 4.16*  CALCIUM  --  7.6* 8.3* 8.2* 8.1* 8.2*  MG 2.0  --   --   --  1.9 2.0  PHOS 5.4*  --   --    --  3.8 4.3     Liver Function Tests: Recent Labs  Lab 11/13/19 0359 11/14/19 0531 11/15/19 0911 11/15/19 2215 11/16/19 0059  AST 347* 152* 79*  --  60*  ALT 621* 461* 327*  --  276*  ALKPHOS 216* 220* 228*  --  229*  BILITOT 2.0* 1.3* 1.0  --  1.3*  PROT 5.3* 5.6* 6.0*  --  6.2*  ALBUMIN 2.3* 2.3* 2.4* 2.4* 2.4*     Antibiotics: Anti-infectives (From admission, onward)   Start     Dose/Rate Route Frequency Ordered Stop   11/18/19 1200  vancomycin (VANCOCIN) IVPB 750 mg/150 ml premix  Status:  Discontinued        750 mg 150 mL/hr over 60 Minutes Intravenous Every T-Th-Sa (Hemodialysis) 11/16/19 0047 11/16/19 1347   11/16/19 1000  metroNIDAZOLE (FLAGYL) IVPB 500 mg  Status:  Discontinued        500 mg 100 mL/hr over 60 Minutes Intravenous Every 8 hours 11/16/19 0537 11/16/19 1347   11/16/19 0145  vancomycin (VANCOREADY) IVPB 1250 mg/250 mL        1,250 mg 166.7 mL/hr over 90 Minutes Intravenous  Once 11/16/19 0047 11/16/19 0404   11/16/19 0045  metroNIDAZOLE (FLAGYL) IVPB 500 mg  Status:  Discontinued        500 mg 100 mL/hr over 60 Minutes Intravenous Every 8 hours 11/16/19 0033 11/16/19 0537   11/11/19 1000  cefTRIAXone (ROCEPHIN) 2 g in sodium chloride 0.9 % 100 mL IVPB  Status:  Discontinued        2 g 200 mL/hr over 30 Minutes Intravenous Every 24 hours 11/11/19 0914 11/16/19 1347       DVT prophylaxis: Heparin  Code Status: DNR  Family Communication: No family at bedside    Status is: Inpatient  Dispo: The patient is from: Home              Anticipated d/c is to: Residential hospice              Anticipated d/c date is: 11/20/2019              Patient currently medically stable for discharge, awaiting bed at hospice facility.  Barrier to discharge-awaiting bed at residential hospice.      Consultants:  Nephrology  Palliative care  Procedures:  None   Objective   Vitals:   11/18/19 1227 11/19/19 0024 11/19/19 0034 11/19/19 0721  BP:  112/76 (!) 134/93  (!) 154/92  Pulse: 83 92  96  Resp: 16 18  (!) 22  Temp: (!) 97.3 F (36.3 C) (!) 97.4 F (36.3 C)  (!) 97.4 F (36.3 C)  TempSrc: Oral Oral  Oral  SpO2:  90%  91%  Weight:   62.6 kg   Height:        Intake/Output Summary (Last 24 hours) at 11/19/2019 1335  Last data filed at 11/19/2019 0700 Gross per 24 hour  Intake 680 ml  Output 900 ml  Net -220 ml    09/13 1901 - 09/15 0700 In: 920 [P.O.:920] Out: 1575 [DPOEU:2353]  Filed Weights   11/16/19 0415 11/17/19 0548 11/19/19 0034  Weight: 61.3 kg 62.4 kg 62.6 kg    Physical Examination:    General: Appears in no acute distress  Cardiovascular: S1-S2, regular, no murmur auscultated  Respiratory: Clear to auscultation bilaterally  Abdomen: Soft, nontender, no organomegaly  Extremities: No edema in the lower extremities  Neurologic: Alert, oriented to self and place, lacks insight and judgment    Data Reviewed:   Recent Results (from the past 240 hour(s))  SARS Coronavirus 2 by RT PCR (hospital order, performed in [facility]) Nasopharyngeal Nasopharyngeal Swab     Status: None   Collection Time: 11/09/19 10:29 PM   Specimen: Nasopharyngeal Swab  Result Value Ref Range Status   SARS Coronavirus 2 NEGATIVE NEGATIVE Final    Comment: (NOTE) SARS-CoV-2 target nucleic acids are NOT DETECTED.  The SARS-CoV-2 RNA is generally detectable in upper and lower respiratory specimens during the acute phase of infection. The lowest concentration of SARS-CoV-2 viral copies this assay can detect is 250 copies / mL. A negative result does not preclude SARS-CoV-2 infection and should not be used as the sole basis for treatment or other patient management decisions.  A negative result may occur with improper specimen collection / handling, submission of specimen other than nasopharyngeal swab, presence of viral mutation(s) within the areas targeted by this assay, and inadequate number of viral  copies (<250 copies / mL). A negative result must be combined with clinical observations, patient history, and epidemiological information.  Fact Sheet for Patients:   StrictlyIdeas.no  Fact Sheet for Healthcare Providers: BankingDealers.co.za  This test is not yet approved or  cleared by the [place] FDA and has been authorized for detection and/or diagnosis of SARS-CoV-2 by FDA under an Emergency Use Authorization (EUA).  This EUA will remain in effect (meaning this test can be used) for the duration of the COVID-19 declaration under Section 564(b)(1) of the Act, 21 U.S.C. section 360bbb-3(b)(1), unless the authorization is terminated or revoked sooner.  Performed at [facility], [place] [address], [place], [place] [address]   Blood culture (routine single)     Status: None   Collection Time: 11/10/19 12:05 AM   Specimen: BLOOD  Result Value Ref Range Status   Specimen Description BLOOD LEFT ANTECUBITAL  Final   Special Requests   Final    BOTTLES DRAWN AEROBIC AND ANAEROBIC Blood Culture adequate volume   Culture   Final    NO GROWTH 5 DAYS Performed at [facility], [place] [address] [place], [place] [address]    Report Status 11/15/2019 FINAL  Final  MRSA PCR Screening     Status: None   Collection Time: 11/10/19  5:31 AM   Specimen: Nasal Mucosa; Nasopharyngeal  Result Value Ref Range Status   MRSA by PCR NEGATIVE NEGATIVE Final    Comment:        The GeneXpert MRSA Assay (FDA approved for NASAL specimens only), is one component of a comprehensive MRSA colonization surveillance program. It is not intended to diagnose MRSA infection nor to guide or monitor treatment for MRSA infections. Performed at [facility], [place] [address] [place], [place] [address]   Culture, blood (routine x 2)  Status: None   Collection Time: 11/10/19  5:59 AM   Specimen: BLOOD LEFT HAND  Result Value Ref Range  Status   Specimen Description BLOOD LEFT HAND  Final   Special Requests   Final    BOTTLES DRAWN AEROBIC ONLY Blood Culture results may not be optimal due to an inadequate volume of blood received in culture bottles   Culture   Final    NO GROWTH 5 DAYS Performed at [facility], [place] [address] [place], [place] [address]    Report Status 11/15/2019 FINAL  Final  Culture, blood (routine x 2)     Status: None   Collection Time: 11/10/19  6:00 AM   Specimen: BLOOD LEFT HAND  Result Value Ref Range Status   Specimen Description BLOOD LEFT HAND  Final   Special Requests   Final    BOTTLES DRAWN AEROBIC ONLY Blood Culture results may not be optimal due to an inadequate volume of blood received in culture bottles   Culture   Final    NO GROWTH 5 DAYS Performed at [facility], [place] [address] [place], [place] [address]    Report Status 11/15/2019 FINAL  Final  Culture, respiratory (non-expectorated)     Status: None   Collection Time: 11/11/19  9:18 AM   Specimen: Tracheal Aspirate; Respiratory  Result Value Ref Range Status   Specimen Description TRACHEAL ASPIRATE  Final   Special Requests NONE  Final   Gram Stain   Final    MODERATE WBC PRESENT, PREDOMINANTLY PMN ABUNDANT GRAM NEGATIVE RODS    Culture   Final    MODERATE HAEMOPHILUS INFLUENZAE BETA LACTAMASE NEGATIVE Performed at [facility], [place] [address] [place], [place] [address]    Report Status 11/13/2019 FINAL  Final  Culture, blood (routine x 2)     Status: None (Preliminary result)   Collection Time: 11/16/19 12:55 AM   Specimen: BLOOD RIGHT ARM  Result Value Ref Range Status   Specimen Description BLOOD RIGHT ARM  Final   Special Requests   Final    BOTTLES DRAWN AEROBIC AND ANAEROBIC Blood Culture adequate volume   Culture   Final    NO GROWTH 2 DAYS Performed at [facility], [place] [address] [place], [place] [address]    Report Status PENDING  Incomplete  Culture, blood (routine x 2)      Status: None (Preliminary result)   Collection Time: 11/16/19 12:58 AM   Specimen: BLOOD RIGHT HAND  Result Value Ref Range Status   Specimen Description BLOOD RIGHT HAND  Final   Special Requests   Final    BOTTLES DRAWN AEROBIC ONLY Blood Culture adequate volume   Culture   Final    NO GROWTH 2 DAYS Performed at [facility], [place] [address], [place], [place] [address]    Report Status PENDING  Incomplete    No results for input(s): LIPASE, AMYLASE in the last 168 hours. No results for input(s): AMMONIA in the last 168 hours.  Cardiac Enzymes: No results for input(s): CKTOTAL, CKMB, CKMBINDEX, TROPONINI in the last 168 hours. BNP (last 3 results) Recent Labs    11/10/19 0001  BNP 4,105.8*    ProBNP (last 3 results) No results for input(s): PROBNP in the last 8760 hours.  Studies:  No results found.     Oswald Hillock   [facility] If 7PM-7AM, please contact night-coverage at [email or web address], Office  [phone]   11/19/2019,  1:35 PM  LOS: 9 days

## 2019-11-19 NOTE — Progress Notes (Signed)
Palliative:  HPI: 59 y.o.malewith past medical history of ESRD on dialysis TTS, hepatitis C, COPD, HTN, HLD, seizures, hepatic steatosis, degenerative disc disease, nicotine dependence, severe malnutrition with failure to thrive. Recent admission in June [facility] with COPD exacerbation requiring intubation and dialysis initiated. Admitted on9/5/2021with cardiac arrest at home and total time resuscitated 10-12 minutes with PEA and then VT/VF.Complicated by aspiration pneumonitis vs pneumonia, RV failure, shock liver.  I met today with An. We discussed that Stephanie's daughter currently has COVID infection so he cannot immediately return home. He is concerned and wonders which daughter is ill but I do not know this answer. We discussed alternative plan for considering going to hospice facility with hopes that he can return home with hospice once quarantine over for [place] and her family. He tells me about a friend that was at hospice facility in [place] and he would like to go here. He is anxious to get out of the hospital and get closer to home where he will be closer to his friends. He does continue to complain with chest pain but the pain medication is helping. He is very attached to his suboxone as well.   I called and spoke with brother, Paulo Fruit, and advised him of the new plan. Roland agrees with this plan if [place] is on board. He is in communication with hospice already to coordinate. We know that there is a chance that Greydon could decline further at hospice and if so at least he will be in the right place to provide him with good care. Will await word from hospice for bed availability.   All questions/concerns addressed. Emotional support.   Exam: Alert, oriented to person and place and somewhat to situation. Orientation and insight fluctuates. Poor memory. No distress. Appears fatigued.   Plan: - Plan for hospice facility in [place]. He is hopeful for eventual transition home with  hospice.   [place], NP Palliative Medicine Team Pager [phone] (Please see [email or web address] for schedule) Team Phone [phone]    Greater than 50%  of this time was spent counseling and coordinating care related to the above assessment and plan

## 2019-11-19 NOTE — Progress Notes (Signed)
Palliative Medicine RN Note: Spoke to Cheri with [facility] at the request of [name], NP.   Patient is now wanting to go home, but home caregivers have [place] and are quarantined; [facility] [place] will review chart to see if pt can go to the hospice home in [place].  Marjie Skiff Sye Schroepfer, RN, BSN, [facility] Palliative Medicine Team 11/19/2019 10:48 AM Office [phone]

## 2019-11-19 NOTE — TOC Transition Note (Addendum)
Transition of Care [facility]) - CM/SW Discharge Note   Patient Details  Name: Ralph Becker MRN: [id number] Date of Birth: Feb 08, 1961  [facility] CM/SW Contact:  Zenon Mayo, RN Phone Number: 11/19/2019, 4:03 PM   Clinical Narrative:    NCM received call from [place] with [facility], she states family would like for patient to go there at discharge,  The address to facility is  [address] [place] , the number for Staff RN to call report is [phone]. MD states patient will dc in the am.  NCM notified patient and his brother that he will be dc in am.   He will go by [facility], forms is on the  Chart with DNR and Most form.   Final next level of care: [place] Barriers to Discharge: No Barriers Identified   Patient Goals and CMS Choice Patient states their goals for this hospitalization and ongoing recovery are:: hospice facility      Discharge Placement                       Discharge Plan and Services                  DME Agency: NA       HH Arranged: NA          Social Determinants of Health (SDOH) Interventions     Readmission Risk Interventions No flowsheet data found.

## 2019-11-19 NOTE — Progress Notes (Addendum)
[facility]  Asked by Threasa Beards RN with PC to review chart for [facility] since family lives in [place]. I have reached out to the brother and left a VM for a return call to further discuss goals and their interest. Webb Silversmith RN [phone]   TC from the pt's brother Oddis Westling next of kin. He reports pt is estranged from him due to his lifestyle choices and he really appreciated me calling and updating him however he feels that it would be more appropriate for his girlfriend who has been living with him to handle any paperwork. He is in agreement with pt transferring to [facility] and understands that he is looking at a poor prognosis withdrawing from dialysis. Spoke to [employer] friend who lives with him. Her quarantine will be over on 18th of the month. She is in agreement with pt transferring to the [facility] as well. She will sign paperwork by docusign. I had these sent to her but will need to have new ones sent in am due to the pt CM reports the pt will transfer tomorrow instead of today.    Report for nurse to call is [phone] tomorrow. I will update the CM Neoma Laming after consents have been returned in am for discharge plans.  Webb Silversmith RN [phone]

## 2019-11-19 NOTE — Progress Notes (Signed)
Manufacturing engineer [facility])  [facility] does not have any beds to offer today.  Noted plans to possibly d/c home with hospice from [place].  Noted that pt cannot go home due to caregivers status.  Will keep on list and update TOC and family should a bed become available and family still want [employer].  Venia Carbon RN, BSN, [facility]

## 2019-11-20 DIAGNOSIS — N186 End stage renal disease: Secondary | ICD-10-CM

## 2019-11-20 MED ORDER — ACETAMINOPHEN 325 MG PO TABS: 650.0000 mg | ORAL_TABLET | Freq: Four times a day (QID) | ORAL | Status: AC | PRN

## 2019-11-20 MED ORDER — BUPRENORPHINE HCL-NALOXONE HCL 8-2 MG SL SUBL
1.0000 | SUBLINGUAL_TABLET | Freq: Every day | SUBLINGUAL | 0 refills | Status: AC
Start: 2019-11-21 — End: ?

## 2019-11-20 NOTE — Progress Notes (Signed)
°   11/20/19 1045  Clinical Encounter Type  Visited With Patient  Visit Type Follow-up;Spiritual support;Social support  Referral From Palliative care team  Consult/Referral To Chaplain  This chaplain followed up with Pt. spiritual care.  The chaplain checked in with the Pt. RN-Tran. The chaplain noticed the Pt. is able to follow a conversation from beginning to end today.  The Pt. has positive reflections from a friend's Hospice experience he is carrying with him.  In the Pt. Hospice experience the Pt. is hoping his friend Theadore Nan and Colletta [place] can visit him.  The Pt. is also grateful for the healing conversation with the Pt. brother-Roland.  The chaplain recommends time to listen to the Pt. in his F/U spiritual care.

## 2019-11-20 NOTE — Discharge Summary (Signed)
Physician Discharge Summary  Ralph Becker XAJ:[id number] DOB: 16-Jan-1961 DOA: 11/09/2019  PCP: Patient, No Pcp Per  Admit date: 11/09/2019 Discharge date: 11/20/2019  Time spent: 50 minutes  Recommendations for Outpatient Follow-up:  1. Patient to be discharged to hospice   Discharge Diagnoses:  Active Problems:   Cardiac arrest ([place])   Failure to thrive in adult   Goals of care, counseling/discussion   DNR (do not resuscitate)   Palliative care by specialist   Terminal care   Discharge Condition: Stable  Diet recommendation: Comfort diet  Filed Weights   11/17/19 0548 11/19/19 0034 11/20/19 0408  Weight: 62.4 kg 62.6 kg 62.1 kg    History of present illness:  59 year old male with history of ESRD on HD TTS, hepatitis C, COPD, hypertension, hyperlipidemia, seizure disorder who was found unresponsive at home.  Patient was found to be in asystole with cardiac arrest.  He received 4 minutes of CPR.  He developed V. fib/V. tach and was defibrillated twice.  Total duration of resuscitation was 10 to 12 minutes.  He was intubated by EMS and was admitted to ICU.  He was successfully extubated on 11/13/2019.  TRH assumed care on 11/15/2019. Palliative care was consulted, and recommended no tracheostomy, no PEG tube, DNR.  Palliative care had long.  On 11/16/2019 patient pulled out hemodialysis catheter.  Nephrology recommended to discuss with palliative care regarding making him comfort measures only.  Palliative care discussed with patient's brother and at this time recommended transition to comfort care only.  Hospital Course:  1. S/p cardiac arrest/PEA-patient was found unresponsive and was in PEA with cardiac arrest, V. tach/V. fib.  He received 4 minutes of CPR, developed V. fib/V. tach and was defibrillated twice.  Total resuscitation time was 10 to 12 minutes.  Palliative care was consulted.  Patient is DNR/comfort care.  Hemodialysis has been stopped. 2. ESRD on HD-patient was started on  hemodialysis TT S.  He pulled hemodialysis catheter 11/16/2019.  Hemostasis was maintained.  As per nephrology it would be reasonable to discuss with patient family regarding making him comfort care and stopping hemodialysis.  Palliative care had long discussion with patient's brother and this time but has decided to transition to comfort care only.  No further hemodialysis.  Patient will be going to residential hospice. 3. Acute respiratory failure requiring mechanical ventilation-he was initially intubated on arrival, remains in ICU.  He was successfully extubated on 11/13/2019.  He was found to have infiltrate on CT chest consistent with aspiration pneumonia.  Continue aspiration precautions.  Started on ceftriaxone for 7 days.   4. Septic shock-resolved, patient became hypotensive requiring midodrine and steroids.  This was discontinued. 5. Atrial fibrillation-cardioverted with amiodarone but became bradycardic.  He is currently not on antiarrhythmics.  Continue metoprolol. 6. Goals of care-palliative care was consulted, patient is DNR, transition to comfort care only.  Hemodialysis has been discontinued.  Procedures:    Consultations:  Nephrology  Palliative care  Discharge Exam: Vitals:   11/20/19 0755 11/20/19 1150  BP: (!) 155/90 (!) 154/100  Pulse: 92 76  Resp: 16 19  Temp: (!) 97.3 F (36.3 C) 97.7 F (36.5 C)  SpO2: 93% 96%    General: Appears in no acute distress Cardiovascular: S1-S2, regular Respiratory: Clear to auscultation bilaterally  Discharge Instructions   Discharge Instructions    Diet - low sodium heart healthy   Complete by: As directed    Increase activity slowly   Complete by: As directed  Allergies as of 11/20/2019      Reactions   Penicillins    Unknown. Can't remember.       Medication List    STOP taking these medications   atorvastatin 40 MG tablet Commonly known as: LIPITOR   calcium acetate 667 MG capsule Commonly known as:  PHOSLO   clonazePAM 1 MG tablet Commonly known as: KLONOPIN   Flovent HFA 220 MCG/ACT inhaler Generic drug: fluticasone   gabapentin 300 MG capsule Commonly known as: NEURONTIN   hydrALAZINE 25 MG tablet Commonly known as: APRESOLINE   hydrALAZINE 50 MG tablet Commonly known as: APRESOLINE   lidocaine-prilocaine cream Commonly known as: EMLA   metoprolol tartrate 25 MG tablet Commonly known as: LOPRESSOR   Rena-Vite Rx 1 MG Tabs   sodium bicarbonate 650 MG tablet   Suboxone 8-2 MG Film Generic drug: Buprenorphine HCl-Naloxone HCl Replaced by: buprenorphine-naloxone 8-2 mg Subl SL tablet   tamsulosin 0.4 MG Caps capsule Commonly known as: FLOMAX     TAKE these medications   acetaminophen 325 MG tablet Commonly known as: TYLENOL Take 2 tablets (650 mg total) by mouth every 6 (six) hours as needed for moderate pain or fever.   buprenorphine-naloxone 8-2 mg Subl SL tablet Commonly known as: SUBOXONE Place 1 tablet under the tongue daily. Start taking on: November 21, 2019 Replaces: Suboxone 8-2 MG Film      Allergies  Allergen Reactions  . Penicillins     Unknown. Can't remember.       The results of significant diagnostics from this hospitalization (including imaging, microbiology, ancillary and laboratory) are listed below for reference.    Significant Diagnostic Studies: DG Chest 1 View  Result Date: 11/16/2019 CLINICAL DATA:  Hemodialysis. Traumatic removal of tunneled catheter EXAM: CHEST  1 VIEW COMPARISON:  Radiograph 11/15/2019 FINDINGS: Removal of RIGHT central venous catheter.  No pneumothorax. Stable cardiac silhouette. Chronic interstitial lung disease. Posterior thoracic fusion. IMPRESSION: Removal of RIGHT central venous catheter without pneumothorax. No retained catheter. Electronically Signed   By: Suzy Bouchard M.D.   On: 11/16/2019 05:30   DG Chest 1 View  Result Date: 11/13/2019 CLINICAL DATA:  Intubation.  Respiratory failure. EXAM:  CHEST  1 VIEW COMPARISON:  CT 11/11/2019.  Chest x-ray 11/10/2019. FINDINGS: Interim placement of NG tube, tip below left hemidiaphragm. Endotracheal tube, bilateral subclavian lines in stable position. Cardiomegaly. Diffuse bilateral interstitial infiltrates again noted. Costophrenic angles incompletely imaged. Tiny right pleural effusion cannot be excluded. No pneumothorax. IMPRESSION: 1. Interim placement of NG tube, its tip is below left hemidiaphragm. Remaining lines and tubes in stable position. 2. Diffuse bilateral interstitial infiltrates again noted without interim change. Tiny right pleural effusion cannot be excluded. 3.  Cardiomegaly. Electronically Signed   By: Marcello Moores  Register   On: 11/13/2019 07:45   CT HEAD WO CONTRAST  Result Date: 11/16/2019 CLINICAL DATA:  Delirium.  Increasing hypoxia. EXAM: CT HEAD WITHOUT CONTRAST TECHNIQUE: Contiguous axial images were obtained from the base of the skull through the vertex without intravenous contrast. COMPARISON:  CT head without contrast 11/09/2019 FINDINGS: Brain: Mild atrophy and white matter changes are stable. The study is mildly degraded by patient motion. No acute infarct, hemorrhage, or mass lesion is present. The ventricles are of normal size. No significant extraaxial fluid collection is present. Vascular: Atherosclerotic calcifications are present within the cavernous internal carotid arteries bilaterally. Calcifications are present at the dural margin of the vertebral arteries. No hyperdense vessel is present. Skull: Skull base is distorted by  patient motion. No focal abnormalities are present. No lytic or blastic lesions. No healing fractures are present. No significant extracranial soft tissue lesion is present. Sinuses/Orbits: Fluid is present in the sphenoid sinuses. Diffuse mucosal thickening and opacification is present posterior ethmoid air cells. This may be related to recent intubation. The globes and orbits are within normal limits.  IMPRESSION: 1. Stable atrophy and white matter disease. This likely reflects the sequela of chronic microvascular ischemia. 2. No acute intracranial abnormality or significant interval change. Electronically Signed   By: San Morelle M.D.   On: 11/16/2019 10:44   CT Head Wo Contrast  Result Date: 11/09/2019 CLINICAL DATA:  Patient was found unresponsive at home. CPR. History of seizures, chronic hepatitis C, hypertension. EXAM: CT HEAD WITHOUT CONTRAST CT CERVICAL SPINE WITHOUT CONTRAST TECHNIQUE: Multidetector CT imaging of the head and cervical spine was performed following the standard protocol without intravenous contrast. Multiplanar CT image reconstructions of the cervical spine were also generated. COMPARISON:  CT head 09/08/2019 FINDINGS: CT HEAD FINDINGS Brain: Motion artifact limits examination. Ventricles and sulci are symmetrical. No ventricular dilatation. No mass effect or midline shift. No abnormal extra-axial fluid collections. Gray-white matter junctions are distinct. Basal cisterns are not effaced. No acute intracranial hemorrhage. Vascular: Intracranial arterial vascular calcifications are present. Skull: The calvarium appears intact. Sinuses/Orbits: Small air-fluid levels in the maxillary antra bilaterally. Opacification of multiple ethmoid air cells. Mastoid air cells are clear. Other: OG and endotracheal tubes are present. CT CERVICAL SPINE FINDINGS Alignment: Motion artifact limits examination. Alignment appears normal. C1-2 articulation appears intact. Skull base and vertebrae: No vertebral compression deformities. No focal bone lesion or bone destruction. Postoperative fixation is noted in the upper thoracic spine, incompletely included. Soft tissues and spinal canal: No prevertebral fluid or swelling. No visible canal hematoma. Disc levels: Degenerative changes throughout the cervical spine with narrowed interspaces and endplate hypertrophic change. Upper chest: Motion artifact  limits examination but there appears to be patchy infiltration in the lung apices with probable bronchiectasis. Consider pneumonia or edema. Aspiration could also have this appearance. Other: Enteric and endotracheal tubes are present. IMPRESSION: 1. No acute intracranial abnormalities. 2. Normal alignment of the cervical spine. Degenerative changes. No acute displaced fractures identified. 3. Patchy infiltration in the lung apices with probable bronchiectasis. Consider pneumonia or edema. Aspiration could also have this appearance. Electronically Signed   By: Lucienne Capers M.D.   On: 11/09/2019 23:36   CT ANGIO CHEST PE W OR WO CONTRAST  Result Date: 11/11/2019 CLINICAL DATA:  PE suspected, high probability.  Elevated D-dimer. EXAM: CT ANGIOGRAPHY CHEST WITH CONTRAST TECHNIQUE: Multidetector CT imaging of the chest was performed using the standard protocol during bolus administration of intravenous contrast. Multiplanar CT image reconstructions and MIPs were obtained to evaluate the vascular anatomy. CONTRAST:  27mL OMNIPAQUE IOHEXOL 350 MG/ML SOLN COMPARISON:  CT chest without contrast 08/09/2019 at [facility]. FINDINGS: Cardiovascular: Heart size is normal. Atherosclerotic calcifications are present at the aortic arch. Great vessel origins are within normal limits. Pulmonary artery opacification is normal. Pulmonary artery size is within normal limits. No focal filling defects are present to suggest pulmonary emboli. Mediastinum/Nodes: No significant mediastinal, hilar, or axillary adenopathy is present. Endotracheal tube is in satisfactory position. Lungs/Pleura: Patchy airspace opacities are present at the lung apices bilaterally, right greater than left. There is a dependent atelectasis are noted bilaterally. Small right pleural effusion is present. No pneumothorax is present. Upper Abdomen: NG tube is in place. Upper abdomen is otherwise is  within normal limits. Musculoskeletal: Thoracic fusion is  stable. Vertebral body heights are normal. No focal lytic or blastic lesions are present. Review of the MIP images confirms the above findings. IMPRESSION: 1. No evidence for pulmonary embolus. 2. Patchy airspace opacities at the lung apices bilaterally, right greater than left. Findings are concerning for pneumonia. 3. Small right pleural effusion. 4. Endotracheal tube and NG tube are in satisfactory position. 5. Aortic Atherosclerosis (ICD10-I70.0). Electronically Signed   By: San Morelle M.D.   On: 11/11/2019 07:27   CT Cervical Spine Wo Contrast  Result Date: 11/09/2019 CLINICAL DATA:  Patient was found unresponsive at home. CPR. History of seizures, chronic hepatitis C, hypertension. EXAM: CT HEAD WITHOUT CONTRAST CT CERVICAL SPINE WITHOUT CONTRAST TECHNIQUE: Multidetector CT imaging of the head and cervical spine was performed following the standard protocol without intravenous contrast. Multiplanar CT image reconstructions of the cervical spine were also generated. COMPARISON:  CT head 09/08/2019 FINDINGS: CT HEAD FINDINGS Brain: Motion artifact limits examination. Ventricles and sulci are symmetrical. No ventricular dilatation. No mass effect or midline shift. No abnormal extra-axial fluid collections. Gray-white matter junctions are distinct. Basal cisterns are not effaced. No acute intracranial hemorrhage. Vascular: Intracranial arterial vascular calcifications are present. Skull: The calvarium appears intact. Sinuses/Orbits: Small air-fluid levels in the maxillary antra bilaterally. Opacification of multiple ethmoid air cells. Mastoid air cells are clear. Other: OG and endotracheal tubes are present. CT CERVICAL SPINE FINDINGS Alignment: Motion artifact limits examination. Alignment appears normal. C1-2 articulation appears intact. Skull base and vertebrae: No vertebral compression deformities. No focal bone lesion or bone destruction. Postoperative fixation is noted in the upper thoracic  spine, incompletely included. Soft tissues and spinal canal: No prevertebral fluid or swelling. No visible canal hematoma. Disc levels: Degenerative changes throughout the cervical spine with narrowed interspaces and endplate hypertrophic change. Upper chest: Motion artifact limits examination but there appears to be patchy infiltration in the lung apices with probable bronchiectasis. Consider pneumonia or edema. Aspiration could also have this appearance. Other: Enteric and endotracheal tubes are present. IMPRESSION: 1. No acute intracranial abnormalities. 2. Normal alignment of the cervical spine. Degenerative changes. No acute displaced fractures identified. 3. Patchy infiltration in the lung apices with probable bronchiectasis. Consider pneumonia or edema. Aspiration could also have this appearance. Electronically Signed   By: Lucienne Capers M.D.   On: 11/09/2019 23:36   US Abdomen Complete  Result Date: 11/10/2019 CLINICAL DATA:  Hepatitis. EXAM: ABDOMEN ULTRASOUND COMPLETE COMPARISON:  None. FINDINGS: Gallbladder: No cholelithiasis is noted. Severe gallbladder wall thickening is noted at 10 mm with pericholecystic fluid. The presence or absence of sonographic Murphy's sign could not be assessed as the patient was comatose. 5 mm polyp is noted. Common bile duct: Diameter: 6 mm which is within normal limits. Liver: No focal lesion identified. Within normal limits in parenchymal echogenicity. Portal vein is patent on color Doppler imaging with normal direction of blood flow towards the liver. IVC: No abnormality visualized. Pancreas: Visualized portion unremarkable. Spleen: Size and appearance within normal limits. Right Kidney: Length: 10.7 cm. Increased echogenicity of renal parenchyma is noted suggesting medical renal disease. No mass or hydronephrosis visualized. Left Kidney: Length: 10.5 cm. Increased echogenicity of renal parenchyma is noted suggesting medical renal disease. No mass or hydronephrosis  visualized. Abdominal aorta: No aneurysm visualized. Other findings: Minimal ascites is noted around the liver. IMPRESSION: 1. Severe gallbladder wall thickening is noted with pericholecystic fluid, but no cholelithiasis is noted. This is concerning for acalculous cholecystitis, and  HIDA scan may be performed for further evaluation. 2. Increased echogenicity of renal parenchyma is noted bilaterally suggesting medical renal disease. 3. Minimal ascites is noted around the liver. Electronically Signed   By: Marijo Conception M.D.   On: 11/10/2019 08:56   DG Chest Port 1 View  Result Date: 11/15/2019 CLINICAL DATA:  Hypoxia, pneumonia EXAM: PORTABLE CHEST 1 VIEW COMPARISON:  Portable exam 2029 hours compared to 11/13/2019 FINDINGS: RIGHT jugular central venous catheter with tip projecting over SVC near cavoatrial junction. Borderline enlargement of cardiac silhouette. Mediastinal contours and pulmonary vascularity normal. Patchy BILATERAL pulmonary infiltrates persist, predominantly interstitial, slightly increased in LEFT lung. No pleural effusion or pneumothorax. Prior spinal fixation thoracic spine. Bones demineralized. IMPRESSION: Persistent BILATERAL interstitial infiltrates favoring pneumonia, slightly increased on LEFT since previous exam. Electronically Signed   By: Lavonia Dana M.D.   On: 11/15/2019 20:56   DG CHEST PORT 1 VIEW  Result Date: 11/10/2019 CLINICAL DATA:  Line placement. EXAM: PORTABLE CHEST 1 VIEW COMPARISON:  Radiograph yesterday. FINDINGS: Left subclavian central venous catheter tip projects over the mid SVC. Right-sided dialysis catheter remains in place. Endotracheal tube tip at the level of the clavicular heads. No enteric tube is visualized. No pneumothorax. Stable heart size and mediastinal contours. Stable peribronchial and interstitial thickening. Biapical pleuroparenchymal scarring. No definite pleural effusion. Bones under mineralized. Remote right rib fractures. IMPRESSION: 1. Left  subclavian central venous catheter tip projects over the mid SVC. Right-sided dialysis catheter remains in place. No pneumothorax. 2. Endotracheal tube tip at the level of the clavicular heads. 3. No enteric tube is visualized. 4. Unchanged peribronchial and interstitial thickening. Electronically Signed   By: Keith Rake M.D.   On: 11/10/2019 17:55   DG Chest Portable 1 View  Result Date: 11/09/2019 CLINICAL DATA:  Intubated.  Status post CPR. EXAM: PORTABLE CHEST 1 VIEW COMPARISON:  Radiograph 09/10/2019 FINDINGS: The endotracheal tube tip is difficult to visualize but likely at the level of the clavicular heads. Enteric tube is in place with tip and side-port below the diaphragm in the stomach. Unchanged appearance of right internal jugular dialysis catheter with tip at the atrial caval junction. Mild cardiomegaly with unchanged mediastinal contours. There is increasing peribronchial and interstitial thickening. No pneumothorax or large pleural effusion. No confluent airspace disease. Multiple remote right rib fractures. No acute osseous abnormalities are seen. IMPRESSION: 1. Endotracheal tube tip is difficult to visualize but likely at the level of the clavicular heads. 2. Enteric tube in place with tip and side-port below the diaphragm in the stomach. 3. Increasing peribronchial and interstitial thickening, may be pulmonary edema or atypical infection. Electronically Signed   By: Keith Rake M.D.   On: 11/09/2019 22:37   DG Swallowing Func-Speech Pathology  Result Date: 11/14/2019 Objective Swallowing Evaluation: Type of Study: MBS-Modified Barium Swallow Study  Patient Details Name: Ralph Becker MRN: [id number] Date of Birth: Aug 03, 1960 Today's Date: 11/14/2019 Time: SLP Start Time (ACUTE ONLY): 1038 -SLP Stop Time (ACUTE ONLY): 1052 SLP Time Calculation (min) (ACUTE ONLY): 14 min Past Medical History: Past Medical History: Diagnosis Date . Chronic hepatitis C ([place])  . COPD (chronic obstructive  pulmonary disease) ([place]) 09/08/2019 . ESRD (end stage renal disease) ([place])   HD Tues, Thurs, Sat . Essential hypertension  . Mixed hyperlipidemia  . Nicotine dependence, cigarettes, uncomplicated  . Seizure disorder ([place]) 09/08/2019 Past Surgical History: Past Surgical History: Procedure Laterality Date . AV FISTULA PLACEMENT Left 09/15/2019  Procedure: ARTERIOVENOUS (AV) FISTULA CREATION LEFT ARM;  Surgeon: [name], MD;  Location: [facility] OR;  Service: Vascular;  Laterality: Left; HPI: Pt is a 59 year old man with hx of ESRD on dialysis, Hep C, COPD, HTN, HLD, seizure hx (?). He was found down and unresponsive at home, and found to be in asystolic cardiac arrest, received 4 min CPR, developed Vfib/vtach, defibrillated twice with total 10-12 minute resuscitation. He was intubated by EMS on 9/6 and extubated 9/9. CXR 9/9: Diffuse bilateral interstitial infiltrates. Palliative care has been consulted and the family would like pt to be reintubated if needed as of 9/9.  No data recorded Assessment / Plan / Recommendation CHL IP CLINICAL IMPRESSIONS 11/14/2019 Clinical Impression Pt was seen in radiology suite for modified barium swallow study. Trials of puree solids, regular texture solids, mixed consistency boluses (mechanical soft with thin liquids), a 48mm barium tablet, and thin liquids via cup and straw were administered. Pt's oropharyngeal swallow mechanism was within functional limits. Coughing was intermittently noted during the study; however, no instances of penetration or aspiration were demonstrated and coughing therefore appears to be unrelated to swallowing. It is recommended that a dysphagia 3 diet with thin liquids be initiated at this time. SLP will see patient once more to ensure tolerance of the diet; however, further skilled SLP services will likely not be needed beyond that.  SLP Visit Diagnosis Dysphagia, unspecified (R13.10) Attention and concentration deficit following -- Frontal lobe and executive  function deficit following -- Impact on safety and function Mild aspiration risk   CHL IP TREATMENT RECOMMENDATION 11/14/2019 Treatment Recommendations Therapy as outlined in treatment plan below   Prognosis 11/14/2019 Prognosis for Safe Diet Advancement Good Barriers to Reach Goals Cognitive deficits Barriers/Prognosis Comment -- CHL IP DIET RECOMMENDATION 11/14/2019 SLP Diet Recommendations Dysphagia 3 (Mech soft) solids;Thin liquid Liquid Administration via Cup;Straw Medication Administration Whole meds with liquid Compensations Slow rate;Small sips/bites Postural Changes Remain semi-upright after after feeds/meals (Comment)   CHL IP OTHER RECOMMENDATIONS 11/14/2019 Recommended Consults -- Oral Care Recommendations Oral care BID Other Recommendations --   CHL IP FOLLOW UP RECOMMENDATIONS 11/14/2019 Follow up Recommendations None   CHL IP FREQUENCY AND DURATION 11/14/2019 Speech Therapy Frequency (ACUTE ONLY) min 1 x/week Treatment Duration 1 week      CHL IP ORAL PHASE 11/14/2019 Oral Phase WFL Oral - Pudding Teaspoon -- Oral - Pudding Cup -- Oral - Honey Teaspoon -- Oral - Honey Cup -- Oral - Nectar Teaspoon -- Oral - Nectar Cup -- Oral - Nectar Straw -- Oral - Thin Teaspoon -- Oral - Thin Cup -- Oral - Thin Straw -- Oral - Puree -- Oral - Mech Soft -- Oral - Regular -- Oral - Multi-Consistency -- Oral - Pill -- Oral Phase - Comment --  CHL IP PHARYNGEAL PHASE 11/14/2019 Pharyngeal Phase WFL Pharyngeal- Pudding Teaspoon -- Pharyngeal -- Pharyngeal- Pudding Cup -- Pharyngeal -- Pharyngeal- Honey Teaspoon -- Pharyngeal -- Pharyngeal- Honey Cup -- Pharyngeal -- Pharyngeal- Nectar Teaspoon -- Pharyngeal -- Pharyngeal- Nectar Cup -- Pharyngeal -- Pharyngeal- Nectar Straw -- Pharyngeal -- Pharyngeal- Thin Teaspoon -- Pharyngeal -- Pharyngeal- Thin Cup -- Pharyngeal -- Pharyngeal- Thin Straw -- Pharyngeal -- Pharyngeal- Puree -- Pharyngeal -- Pharyngeal- Mechanical Soft -- Pharyngeal -- Pharyngeal- Regular -- Pharyngeal --  Pharyngeal- Multi-consistency -- Pharyngeal -- Pharyngeal- Pill -- Pharyngeal -- Pharyngeal Comment --  CHL IP CERVICAL ESOPHAGEAL PHASE 11/14/2019 Cervical Esophageal Phase WFL Pudding Teaspoon -- Pudding Cup -- Honey Teaspoon -- Honey Cup -- Nectar Teaspoon -- Nectar Cup -- Nectar Straw --  Thin Teaspoon -- Thin Cup -- Thin Straw -- Puree -- Mechanical Soft -- Regular -- Multi-consistency -- Pill -- Cervical Esophageal Comment -- Shanika I. Hardin Negus, [place], [place] Office number [phone] Pager [phone] [address] 11/14/2019, 11:04 AM              ECHOCARDIOGRAM COMPLETE  Result Date: 11/10/2019    ECHOCARDIOGRAM REPORT   Patient Name:   TERIQUE KAWABATA Date of Exam: 11/10/2019 Medical Rec #:  [id number]   Height:       69.0 in Accession #:    [id number]  Weight:       134.5 lb Date of Birth:  Oct 11, 1960   BSA:          1.745 m Patient Age:    59 years    BP:           100/69 mmHg Patient Gender: M           HR:           87 bpm. Exam Location:  Inpatient Procedure: 2D Echo, Cardiac Doppler and Color Doppler Indications:    Acute Respiratory Insufficiency 518.82 / R06.89  History:        Patient has no prior history of Echocardiogram examinations.                 COPD; Risk Factors:Hypertension and Dyslipidemia. Cardiac arrest                 this admit, Nicotine dependence, Stage 5 chronic kidney disease                 on chronic dialysis.  Sonographer:    Alvino Chapel RCS Referring Phys: [id number] Collier Bullock  Sonographer Comments: Patient on mechancial ventilator at time of echo IMPRESSIONS  1. Significant RV failure.  2. Left ventricular ejection fraction, by estimation, is 60 to 65%. The left ventricle has normal function. The left ventricle has no regional wall motion abnormalities. There is moderate left ventricular hypertrophy. Left ventricular diastolic parameters were normal.  3. Right ventricular systolic function is severely reduced. The right ventricular size is  severely enlarged.  4. Right atrial size was moderately dilated.  5. The mitral valve is normal in structure. No evidence of mitral valve regurgitation. No evidence of mitral stenosis.  6. The aortic valve is tricuspid. Aortic valve regurgitation is not visualized. Mild to moderate aortic valve sclerosis/calcification is present, without any evidence of aortic stenosis.  7. The inferior vena cava is dilated in size with >50% respiratory variability, suggesting right atrial pressure of 8 mmHg. FINDINGS  Left Ventricle: Left ventricular ejection fraction, by estimation, is 60 to 65%. The left ventricle has normal function. The left ventricle has no regional wall motion abnormalities. The left ventricular internal cavity size was normal in size. There is  moderate left ventricular hypertrophy. Left ventricular diastolic parameters were normal. Right Ventricle: The right ventricular size is severely enlarged. No increase in right ventricular wall thickness. Right ventricular systolic function is severely reduced. Left Atrium: Left atrial size was normal in size. Right Atrium: Right atrial size was moderately dilated. Pericardium: There is no evidence of pericardial effusion. Mitral Valve: The mitral valve is normal in structure. Normal mobility of the mitral valve leaflets. No evidence of mitral valve regurgitation. No evidence of mitral valve stenosis. Tricuspid Valve: The tricuspid valve is normal in structure. Tricuspid valve regurgitation is mild . No evidence of tricuspid stenosis. Aortic Valve: The aortic valve  is tricuspid. Aortic valve regurgitation is not visualized. Mild to moderate aortic valve sclerosis/calcification is present, without any evidence of aortic stenosis. Pulmonic Valve: The pulmonic valve was normal in structure. Pulmonic valve regurgitation is mild. No evidence of pulmonic stenosis. Aorta: The aortic root is normal in size and structure. Venous: The inferior vena cava is dilated in size with  greater than 50% respiratory variability, suggesting right atrial pressure of 8 mmHg. IAS/Shunts: No atrial level shunt detected by color flow Doppler. Additional Comments: Significant RV failure.  LEFT VENTRICLE PLAX 2D LVIDd:         4.20 cm  Diastology LVIDs:         2.60 cm  LV e' lateral:   13.50 cm/s LV PW:         1.10 cm  LV E/e' lateral: 5.6 LV IVS:        1.40 cm  LV e' medial:    10.10 cm/s LVOT diam:     2.00 cm  LV E/e' medial:  7.5 LV SV:         56 LV SV Index:   32 LVOT Area:     3.14 cm  RIGHT VENTRICLE RV S prime:     15.10 cm/s TAPSE (M-mode): 1.6 cm LEFT ATRIUM             Index       RIGHT ATRIUM           Index LA diam:        3.20 cm 1.83 cm/m  RA Area:     25.30 cm LA Vol (A2C):   72.7 ml 41.65 ml/m RA Volume:   91.70 ml  52.54 ml/m LA Vol (A4C):   50.3 ml 28.82 ml/m LA Biplane Vol: 63.5 ml 36.38 ml/m  AORTIC VALVE LVOT Vmax:   106.00 cm/s LVOT Vmean:  64.000 cm/s LVOT VTI:    0.179 m  AORTA Ao Root diam: 3.60 cm MITRAL VALVE MV Area (PHT): 4.23 cm    SHUNTS MV Decel Time: 180 msec    Systemic VTI:  0.18 m MV E velocity: 75.80 cm/s  Systemic Diam: 2.00 cm MV A velocity: 56.20 cm/s MV E/A ratio:  1.35 Jenkins Rouge MD Electronically signed by Jenkins Rouge MD Signature Date/Time: 11/10/2019/2:49:48 PM    Final     Microbiology: Recent Results (from the past 240 hour(s))  Culture, respiratory (non-expectorated)     Status: None   Collection Time: 11/11/19  9:18 AM   Specimen: Tracheal Aspirate; Respiratory  Result Value Ref Range Status   Specimen Description TRACHEAL ASPIRATE  Final   Special Requests NONE  Final   Gram Stain   Final    MODERATE WBC PRESENT, PREDOMINANTLY PMN ABUNDANT GRAM NEGATIVE RODS    Culture   Final    MODERATE HAEMOPHILUS INFLUENZAE BETA LACTAMASE NEGATIVE Performed at [facility], [place] [address], [place], [place] [address]    Report Status 11/13/2019 FINAL  Final  Culture, blood (routine x 2)     Status: None (Preliminary result)    Collection Time: 11/16/19 12:55 AM   Specimen: BLOOD RIGHT ARM  Result Value Ref Range Status   Specimen Description BLOOD RIGHT ARM  Final   Special Requests   Final    BOTTLES DRAWN AEROBIC AND ANAEROBIC Blood Culture adequate volume   Culture   Final    NO GROWTH 3 DAYS Performed at [facility], [place] [address] [place], [place] [address]  Report Status PENDING  Incomplete  Culture, blood (routine x 2)     Status: None (Preliminary result)   Collection Time: 11/16/19 12:58 AM   Specimen: BLOOD RIGHT HAND  Result Value Ref Range Status   Specimen Description BLOOD RIGHT HAND  Final   Special Requests   Final    BOTTLES DRAWN AEROBIC ONLY Blood Culture adequate volume   Culture   Final    NO GROWTH 3 DAYS Performed at [facility], [address], [place], [place] [address]    Report Status PENDING  Incomplete     Labs: Basic Metabolic Panel: Recent Labs  Lab 11/14/19 0531 11/15/19 0911 11/15/19 2215 11/16/19 0059  NA 136 134* 135 136  K 4.9 4.9 4.0 4.2  CL 99 98 98 98  CO2 26 19* 25 26  GLUCOSE 119* 129* 152* 123*  BUN 51* 74* 34* 36*  CREATININE 4.99* 6.46* 3.94* 4.16*  CALCIUM 8.3* 8.2* 8.1* 8.2*  MG  --   --  1.9 2.0  PHOS  --   --  3.8 4.3   Liver Function Tests: Recent Labs  Lab 11/14/19 0531 11/15/19 0911 11/15/19 2215 11/16/19 0059  AST 152* 79*  --  60*  ALT 461* 327*  --  276*  ALKPHOS 220* 228*  --  229*  BILITOT 1.3* 1.0  --  1.3*  PROT 5.6* 6.0*  --  6.2*  ALBUMIN 2.3* 2.4* 2.4* 2.4*   No results for input(s): LIPASE, AMYLASE in the last 168 hours. No results for input(s): AMMONIA in the last 168 hours. CBC: Recent Labs  Lab 11/14/19 0531 11/15/19 0911 11/16/19 0059  WBC 14.7* 21.2* 11.9*  HGB 11.8* 12.0* 12.6*  HCT 37.1* 38.9* 39.3  MCV 90.5 92.2 91.4  PLT 48* 49* 55*   Cardiac Enzymes: No results for input(s): CKTOTAL, CKMB, CKMBINDEX, TROPONINI in the last 168 hours. BNP: BNP (last 3 results) Recent Labs     11/10/19 0001  BNP 4,105.8*    ProBNP (last 3 results) No results for input(s): PROBNP in the last 8760 hours.  CBG: Recent Labs  Lab 11/16/19 0035 11/16/19 0415 11/16/19 0458 11/16/19 0807 11/16/19 1112  GLUCAP 116* 94 95 138* 142*       Signed:  Oswald Hillock MD.  [facility] 11/20/2019, 1:32 PM

## 2019-11-20 NOTE — Progress Notes (Signed)
Report was given to [facility].

## 2019-11-20 NOTE — Plan of Care (Signed)
  Problem: Clinical Measurements: Goal: Respiratory complications will improve Outcome: Progressing   Problem: Elimination: Goal: Will not experience complications related to urinary retention Outcome: Progressing   Problem: Pain Managment: Goal: General experience of comfort will improve Outcome: Progressing

## 2019-11-21 LAB — CULTURE, BLOOD (ROUTINE X 2)
Culture: NO GROWTH
Culture: NO GROWTH
Special Requests: ADEQUATE
Special Requests: ADEQUATE

## 2019-12-09 ENCOUNTER — Encounter (HOSPITAL_COMMUNITY): Payer: Medicaid Other

## 2022-04-04 IMAGING — CT CT HEAD W/O CM
4 of 8 series · 14 of 47 positions shown, 15 images · non-contrast
Comparison: CT head without contrast 11/09/2019

CLINICAL DATA: Delirium.  Increasing hypoxia.

EXAM:
CT HEAD WITHOUT CONTRAST
TECHNIQUE: Contiguous axial images were obtained from the base of the skull
through the vertex without intravenous contrast.

[Series 3: head without · axial · non-contrast · 0.45mm/px · z∈[-73,+17]mm · 4 of 32 slices shown, 5 images]
[im 7/32  brain]
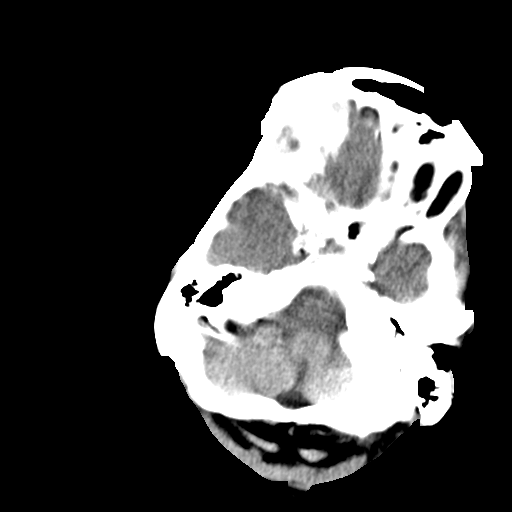
[im 7/32  bone]
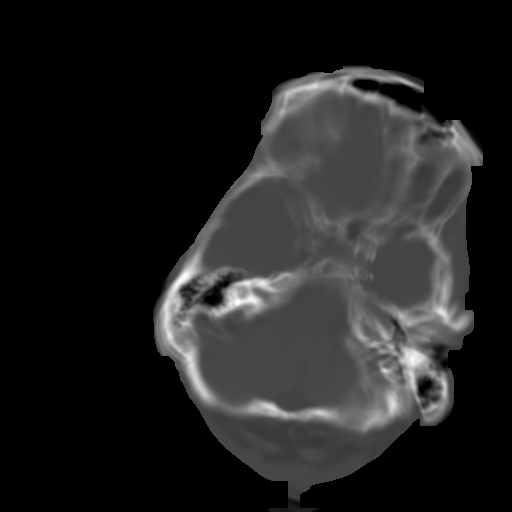
[im 13/32  brain]
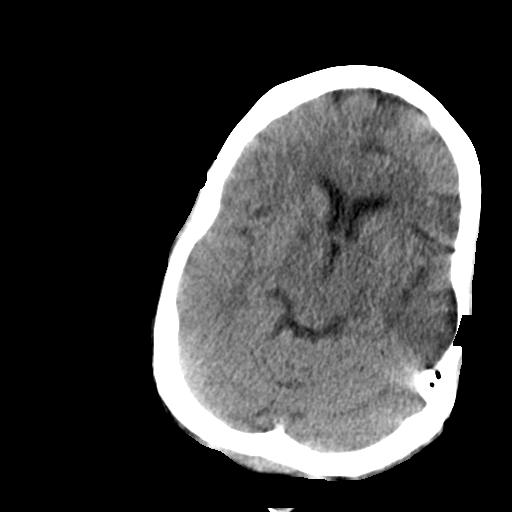
[im 19/32  brain]
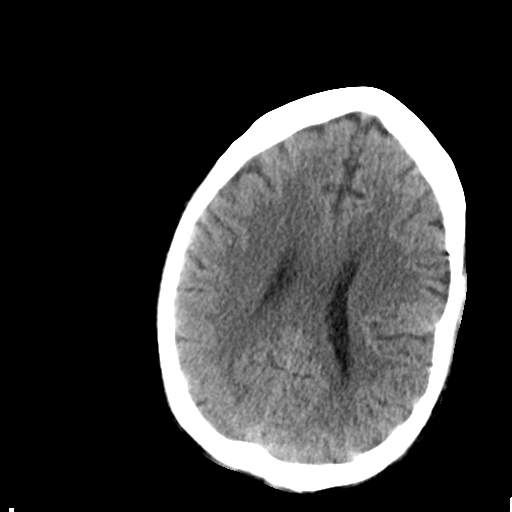
[im 25/32  brain]
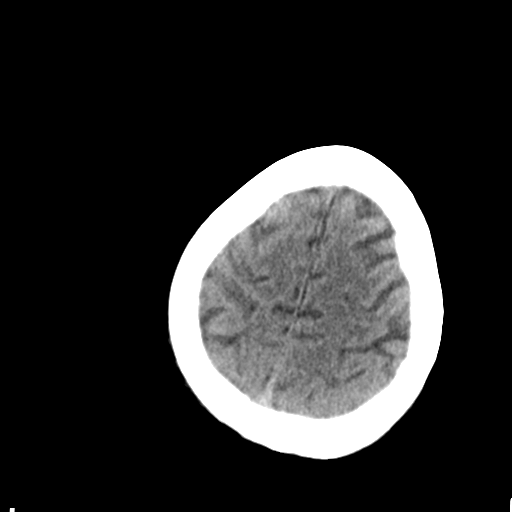

[Series 4: head bone · axial · 0.45mm/px · z∈[-93,-37]mm · 4 of 80 slices shown]
[im 6/80  bone]
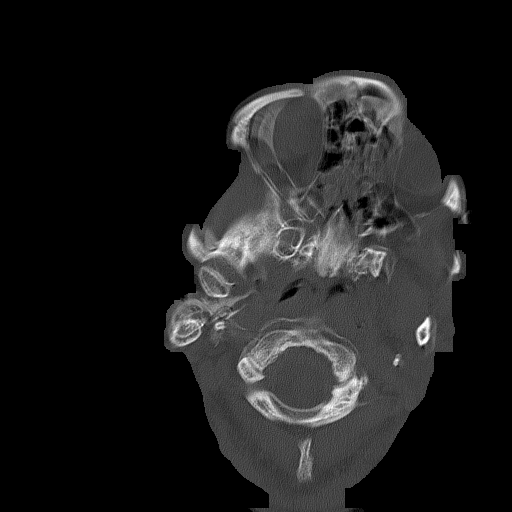
[im 17/80  bone]
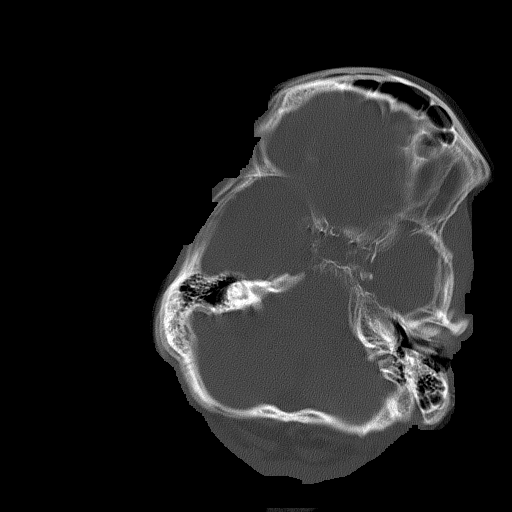
[im 29/80  bone]
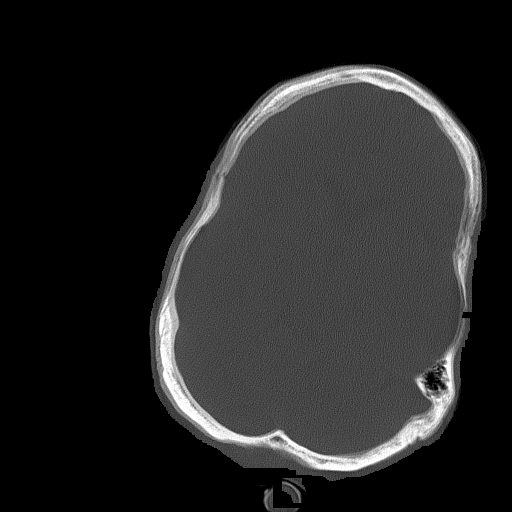
[im 34/80  bone]
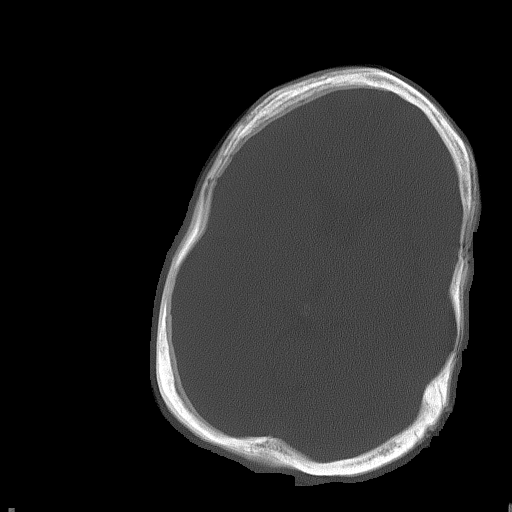

[Series 7: head without cor · coronal · non-contrast · 0.35mm/px · 3 of 77 slices shown]
[im 22/77  brain]
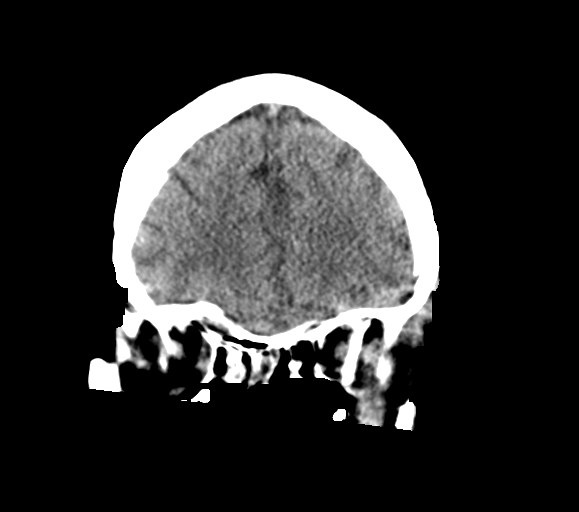
[im 32/77  brain]
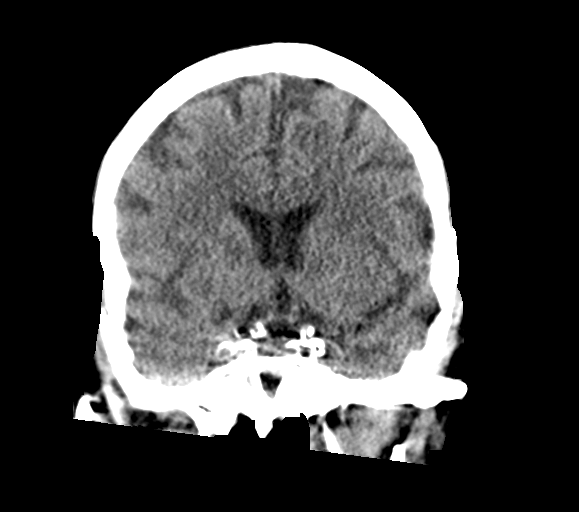
[im 43/77  brain]
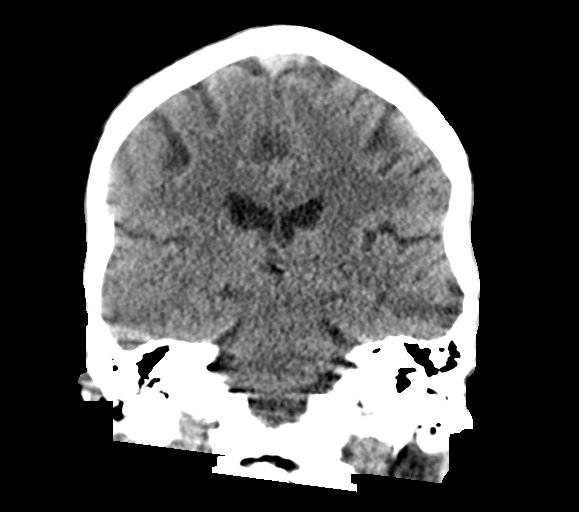

[Series 10: head without sag · sagittal · non-contrast · 0.20mm/px · 3 of 67 slices shown]
[im 17/67  brain]
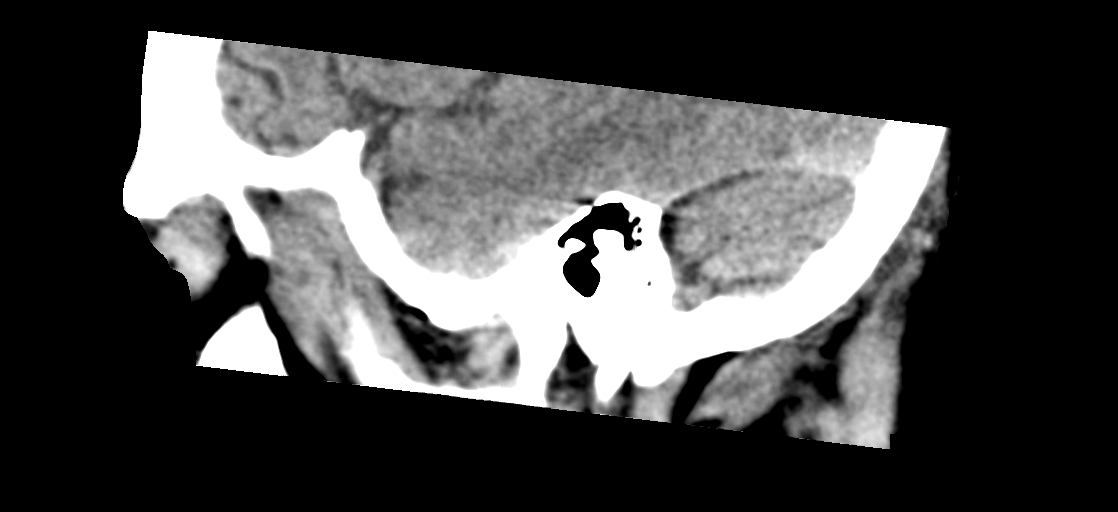
[im 34/67  brain]
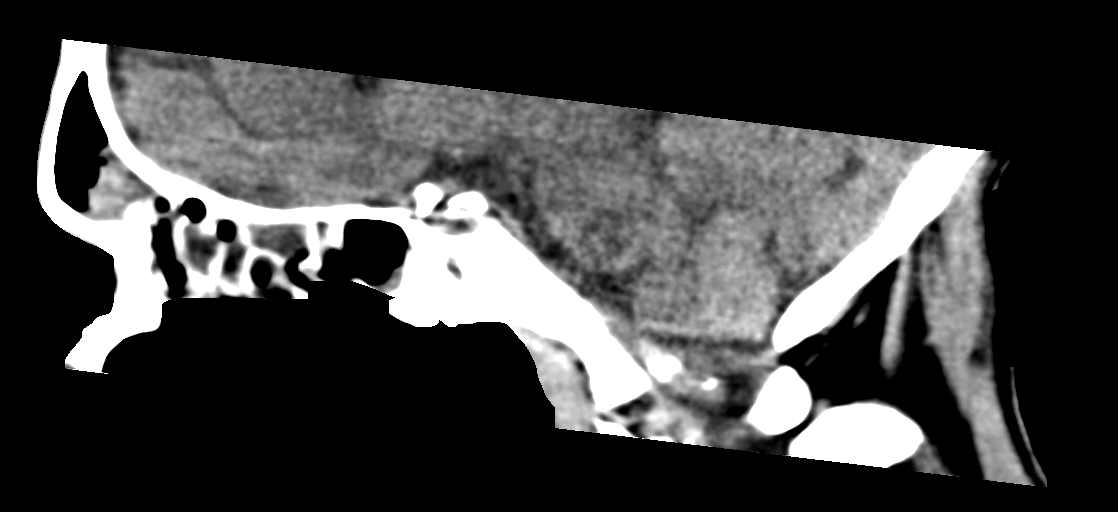
[im 50/67  brain]
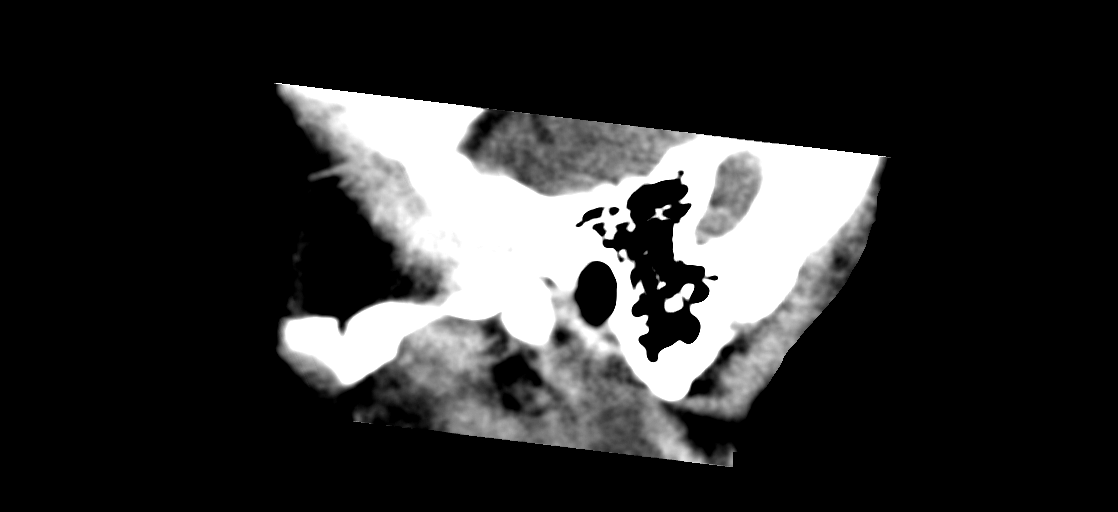

[14 of 47 positions shown; findings below may reference images not displayed]

FINDINGS: Brain: Mild atrophy and white matter changes are stable. The study
is mildly degraded by patient motion. No acute infarct, hemorrhage,
or mass lesion is present. The ventricles are of normal size. No
significant extraaxial fluid collection is present.

Vascular: Atherosclerotic calcifications are present within the
cavernous internal carotid arteries bilaterally. Calcifications are
present at the dural margin of the vertebral arteries. No hyperdense
vessel is present.

Skull: Skull base is distorted by patient motion. No focal
abnormalities are present. No lytic or blastic lesions. No healing
fractures are present. No significant extracranial soft tissue
lesion is present.

Sinuses/Orbits: Fluid is present in the sphenoid sinuses. Diffuse
mucosal thickening and opacification is present posterior ethmoid
air cells. This may be related to recent intubation. The globes and
orbits are within normal limits.
IMPRESSION: 1. Stable atrophy and white matter disease. This likely reflects the
sequela of chronic microvascular ischemia.
2. No acute intracranial abnormality or significant interval change.
